# Patient Record
Sex: Female | Born: 1940
Health system: Southern US, Community
[De-identification: ages and names within clinical notes are randomized; demographics above are authoritative.]

## PROBLEM LIST (undated history)

## (undated) DIAGNOSIS — J45909 Unspecified asthma, uncomplicated: Secondary | ICD-10-CM

## (undated) DIAGNOSIS — E119 Type 2 diabetes mellitus without complications: Secondary | ICD-10-CM

## (undated) DIAGNOSIS — I10 Essential (primary) hypertension: Secondary | ICD-10-CM

## (undated) HISTORY — PX: KNEE ARTHROSCOPY: SUR90

## (undated) HISTORY — PX: REPLACEMENT TOTAL KNEE: SUR1224

---

## 2016-06-12 ENCOUNTER — Ambulatory Visit (HOSPITAL_COMMUNITY)
Admission: RE | Admit: 2016-06-12 | Discharge: 2016-06-12 | Disposition: A | Discharge status: Home / Self Care | Payer: Medicare Other | Source: Ambulatory Visit | Attending: Nurse Practitioner | Admitting: Nurse Practitioner

## 2016-06-12 ENCOUNTER — Encounter (HOSPITAL_BASED_OUTPATIENT_CLINIC_OR_DEPARTMENT_OTHER): Payer: Medicare Other | Attending: Internal Medicine

## 2016-06-12 ENCOUNTER — Other Ambulatory Visit: Payer: Self-pay | Admitting: Nurse Practitioner

## 2016-06-12 DIAGNOSIS — I10 Essential (primary) hypertension: Secondary | ICD-10-CM | POA: Insufficient documentation

## 2016-06-12 DIAGNOSIS — L97526 Non-pressure chronic ulcer of other part of left foot with bone involvement without evidence of necrosis: Secondary | ICD-10-CM | POA: Insufficient documentation

## 2016-06-12 DIAGNOSIS — L89893 Pressure ulcer of other site, stage 3: Secondary | ICD-10-CM | POA: Insufficient documentation

## 2016-06-12 DIAGNOSIS — E1140 Type 2 diabetes mellitus with diabetic neuropathy, unspecified: Secondary | ICD-10-CM | POA: Insufficient documentation

## 2016-06-12 DIAGNOSIS — E11621 Type 2 diabetes mellitus with foot ulcer: Secondary | ICD-10-CM | POA: Insufficient documentation

## 2016-06-12 DIAGNOSIS — M869 Osteomyelitis, unspecified: Secondary | ICD-10-CM

## 2016-06-12 DIAGNOSIS — Z794 Long term (current) use of insulin: Secondary | ICD-10-CM | POA: Diagnosis not present

## 2016-06-12 DIAGNOSIS — Z96651 Presence of right artificial knee joint: Secondary | ICD-10-CM | POA: Diagnosis not present

## 2016-06-19 DIAGNOSIS — E11621 Type 2 diabetes mellitus with foot ulcer: Secondary | ICD-10-CM | POA: Diagnosis not present

## 2016-06-20 ENCOUNTER — Other Ambulatory Visit: Payer: Self-pay | Admitting: Internal Medicine

## 2016-06-20 ENCOUNTER — Other Ambulatory Visit (HOSPITAL_COMMUNITY): Payer: Self-pay | Admitting: *Deleted

## 2016-06-20 DIAGNOSIS — E1169 Type 2 diabetes mellitus with other specified complication: Principal | ICD-10-CM

## 2016-06-20 DIAGNOSIS — M869 Osteomyelitis, unspecified: Principal | ICD-10-CM

## 2016-06-20 DIAGNOSIS — E11621 Type 2 diabetes mellitus with foot ulcer: Secondary | ICD-10-CM

## 2016-06-20 DIAGNOSIS — L97509 Non-pressure chronic ulcer of other part of unspecified foot with unspecified severity: Principal | ICD-10-CM

## 2016-06-25 ENCOUNTER — Other Ambulatory Visit: Payer: Self-pay | Admitting: Internal Medicine

## 2016-06-26 DIAGNOSIS — E11621 Type 2 diabetes mellitus with foot ulcer: Secondary | ICD-10-CM | POA: Diagnosis not present

## 2016-06-27 ENCOUNTER — Ambulatory Visit (HOSPITAL_COMMUNITY): Payer: Medicare Other

## 2016-07-02 ENCOUNTER — Ambulatory Visit (HOSPITAL_COMMUNITY)
Admission: RE | Admit: 2016-07-02 | Discharge: 2016-07-02 | Disposition: A | Discharge status: Home / Self Care | Payer: Medicare Other | Source: Ambulatory Visit | Attending: Internal Medicine | Admitting: Internal Medicine

## 2016-07-02 ENCOUNTER — Encounter (HOSPITAL_COMMUNITY): Payer: Self-pay | Admitting: Radiology

## 2016-07-02 DIAGNOSIS — E11621 Type 2 diabetes mellitus with foot ulcer: Secondary | ICD-10-CM | POA: Diagnosis present

## 2016-07-02 DIAGNOSIS — M2012 Hallux valgus (acquired), left foot: Secondary | ICD-10-CM | POA: Diagnosis not present

## 2016-07-02 DIAGNOSIS — L97509 Non-pressure chronic ulcer of other part of unspecified foot with unspecified severity: Secondary | ICD-10-CM | POA: Diagnosis present

## 2016-07-02 DIAGNOSIS — E1169 Type 2 diabetes mellitus with other specified complication: Secondary | ICD-10-CM

## 2016-07-02 DIAGNOSIS — M65872 Other synovitis and tenosynovitis, left ankle and foot: Secondary | ICD-10-CM | POA: Diagnosis not present

## 2016-07-02 DIAGNOSIS — M869 Osteomyelitis, unspecified: Secondary | ICD-10-CM

## 2016-07-03 ENCOUNTER — Other Ambulatory Visit (HOSPITAL_COMMUNITY)
Admission: RE | Admit: 2016-07-03 | Discharge: 2016-07-03 | Disposition: A | Discharge status: Home / Self Care | Payer: Medicare Other | Source: Other Acute Inpatient Hospital | Attending: Internal Medicine | Admitting: Internal Medicine

## 2016-07-03 DIAGNOSIS — L97526 Non-pressure chronic ulcer of other part of left foot with bone involvement without evidence of necrosis: Secondary | ICD-10-CM | POA: Insufficient documentation

## 2016-07-03 DIAGNOSIS — E1140 Type 2 diabetes mellitus with diabetic neuropathy, unspecified: Secondary | ICD-10-CM | POA: Diagnosis present

## 2016-07-03 DIAGNOSIS — E11621 Type 2 diabetes mellitus with foot ulcer: Secondary | ICD-10-CM | POA: Insufficient documentation

## 2016-07-03 LAB — CBC WITH DIFFERENTIAL/PLATELET
BASOS ABS: 0.1 10*3/uL (ref 0.0–0.1)
BASOS PCT: 1 %
Eosinophils Absolute: 0.5 10*3/uL (ref 0.0–0.7)
Eosinophils Relative: 6 %
HEMATOCRIT: 36.4 % (ref 36.0–46.0)
Hemoglobin: 11.8 g/dL — ABNORMAL LOW (ref 12.0–15.0)
LYMPHS PCT: 21 %
Lymphs Abs: 1.9 10*3/uL (ref 0.7–4.0)
MCH: 29.6 pg (ref 26.0–34.0)
MCHC: 32.4 g/dL (ref 30.0–36.0)
MCV: 91.5 fL (ref 78.0–100.0)
Monocytes Absolute: 0.5 10*3/uL (ref 0.1–1.0)
Monocytes Relative: 5 %
NEUTROS ABS: 6 10*3/uL (ref 1.7–7.7)
Neutrophils Relative %: 67 %
PLATELETS: 270 10*3/uL (ref 150–400)
RBC: 3.98 MIL/uL (ref 3.87–5.11)
RDW: 14 % (ref 11.5–15.5)
WBC: 8.9 10*3/uL (ref 4.0–10.5)

## 2016-07-03 LAB — BASIC METABOLIC PANEL
ANION GAP: 9 (ref 5–15)
BUN: 33 mg/dL — ABNORMAL HIGH (ref 6–20)
CO2: 28 mmol/L (ref 22–32)
Calcium: 9.9 mg/dL (ref 8.9–10.3)
Chloride: 105 mmol/L (ref 101–111)
Creatinine, Ser: 2.24 mg/dL — ABNORMAL HIGH (ref 0.44–1.00)
GFR, EST AFRICAN AMERICAN: 23 mL/min — AB (ref >60)
GFR, EST NON AFRICAN AMERICAN: 20 mL/min — AB (ref >60)
GLUCOSE: 181 mg/dL — AB (ref 65–99)
POTASSIUM: 4.5 mmol/L (ref 3.5–5.1)
Sodium: 142 mmol/L (ref 135–145)

## 2016-07-03 LAB — URIC ACID: Uric Acid, Serum: 4.2 mg/dL (ref 2.3–6.6)

## 2016-07-03 LAB — C-REACTIVE PROTEIN: CRP: 4 mg/dL — AB (ref <1.0)

## 2016-07-03 LAB — SEDIMENTATION RATE: SED RATE: 55 mm/h — AB (ref 0–22)

## 2016-07-10 ENCOUNTER — Encounter (HOSPITAL_BASED_OUTPATIENT_CLINIC_OR_DEPARTMENT_OTHER): Payer: Medicare Other | Attending: Internal Medicine

## 2016-07-10 DIAGNOSIS — E1140 Type 2 diabetes mellitus with diabetic neuropathy, unspecified: Secondary | ICD-10-CM | POA: Insufficient documentation

## 2016-07-10 DIAGNOSIS — E11621 Type 2 diabetes mellitus with foot ulcer: Secondary | ICD-10-CM | POA: Diagnosis not present

## 2016-07-10 DIAGNOSIS — I10 Essential (primary) hypertension: Secondary | ICD-10-CM | POA: Insufficient documentation

## 2016-07-10 DIAGNOSIS — L97524 Non-pressure chronic ulcer of other part of left foot with necrosis of bone: Secondary | ICD-10-CM | POA: Diagnosis not present

## 2016-07-14 ENCOUNTER — Encounter: Payer: Self-pay | Admitting: Family Medicine

## 2016-07-14 DIAGNOSIS — I10 Essential (primary) hypertension: Secondary | ICD-10-CM | POA: Insufficient documentation

## 2016-07-14 DIAGNOSIS — Z794 Long term (current) use of insulin: Secondary | ICD-10-CM

## 2016-07-14 DIAGNOSIS — M159 Polyosteoarthritis, unspecified: Secondary | ICD-10-CM | POA: Insufficient documentation

## 2016-07-14 DIAGNOSIS — E119 Type 2 diabetes mellitus without complications: Secondary | ICD-10-CM | POA: Insufficient documentation

## 2016-07-14 DIAGNOSIS — M109 Gout, unspecified: Secondary | ICD-10-CM | POA: Insufficient documentation

## 2016-07-17 ENCOUNTER — Other Ambulatory Visit: Payer: Self-pay | Admitting: Internal Medicine

## 2016-07-17 DIAGNOSIS — E11621 Type 2 diabetes mellitus with foot ulcer: Secondary | ICD-10-CM | POA: Diagnosis not present

## 2016-07-18 ENCOUNTER — Ambulatory Visit: Payer: Medicare Other | Admitting: Family Medicine

## 2016-07-24 DIAGNOSIS — E11621 Type 2 diabetes mellitus with foot ulcer: Secondary | ICD-10-CM | POA: Diagnosis not present

## 2016-07-29 ENCOUNTER — Encounter: Payer: Self-pay | Admitting: Internal Medicine

## 2016-07-29 ENCOUNTER — Ambulatory Visit (INDEPENDENT_AMBULATORY_CARE_PROVIDER_SITE_OTHER): Payer: Medicare Other | Admitting: Internal Medicine

## 2016-07-29 VITALS — BP 136/78 | HR 89 | Temp 98.4°F | Wt 154.0 lb

## 2016-07-29 DIAGNOSIS — M10379 Gout due to renal impairment, unspecified ankle and foot: Secondary | ICD-10-CM

## 2016-07-29 DIAGNOSIS — M8660 Other chronic osteomyelitis, unspecified site: Secondary | ICD-10-CM | POA: Diagnosis not present

## 2016-07-30 ENCOUNTER — Ambulatory Visit (HOSPITAL_COMMUNITY)
Admission: RE | Admit: 2016-07-30 | Discharge: 2016-07-30 | Disposition: A | Discharge status: Home / Self Care | Payer: Medicare Other | Source: Ambulatory Visit | Attending: Internal Medicine | Admitting: Internal Medicine

## 2016-07-30 ENCOUNTER — Other Ambulatory Visit (HOSPITAL_COMMUNITY): Payer: Self-pay | Admitting: *Deleted

## 2016-07-30 ENCOUNTER — Encounter (HOSPITAL_COMMUNITY)
Admission: RE | Admit: 2016-07-30 | Discharge: 2016-07-30 | Disposition: A | Discharge status: Home / Self Care | Payer: Medicare Other | Source: Ambulatory Visit | Attending: Internal Medicine | Admitting: Internal Medicine

## 2016-07-30 ENCOUNTER — Other Ambulatory Visit: Payer: Self-pay | Admitting: Internal Medicine

## 2016-07-30 ENCOUNTER — Encounter (HOSPITAL_COMMUNITY): Payer: Self-pay | Admitting: Diagnostic Radiology

## 2016-07-30 DIAGNOSIS — M8660 Other chronic osteomyelitis, unspecified site: Secondary | ICD-10-CM

## 2016-07-30 DIAGNOSIS — L97529 Non-pressure chronic ulcer of other part of left foot with unspecified severity: Secondary | ICD-10-CM | POA: Diagnosis not present

## 2016-07-30 HISTORY — PX: IR GENERIC HISTORICAL: IMG1180011

## 2016-07-30 MED ORDER — HEPARIN SOD (PORK) LOCK FLUSH 100 UNIT/ML IV SOLN
INTRAVENOUS | Status: AC
  Administered 2016-07-30: 250 [IU]
  Filled 2016-07-30: qty 5

## 2016-07-30 MED ORDER — SODIUM CHLORIDE 0.9 % IV SOLN
400.0000 mg | Freq: Once | INTRAVENOUS | Status: AC
  Administered 2016-07-30: 400 mg via INTRAVENOUS
  Filled 2016-07-30: qty 400

## 2016-07-30 MED ORDER — HEPARIN SOD (PORK) LOCK FLUSH 100 UNIT/ML IV SOLN
250.0000 [IU] | INTRAVENOUS | Status: AC | PRN
  Administered 2016-07-30: 250 [IU]

## 2016-07-30 MED ORDER — LIDOCAINE HCL (PF) 1 % IJ SOLN
INTRAMUSCULAR | Status: AC
  Filled 2016-07-30: qty 30

## 2016-07-30 NOTE — Procedures (Signed)
Placement of right arm (basilic vein) PICC.  Tip at SVC/RA junction.  Minimal blood loss.  No immediate complication.

## 2016-07-30 NOTE — Progress Notes (Signed)
[facility] for Infectious Disease      Reason for Consult: possible osteomyelitis     Referring Physician: Dr. Dellia Nims    Patient ID: Angela Castro, female    DOB: 04/03/1941, 76 y.o.   MRN: [id number]  HPI:   She comes in for evaluation of long-standing left medial first metatarsal head ulcer.  She reports this started in July and she was going to wound care near her home then in [facility] and it started to heal.  She was getting better so stopped going to the wound care there and treated it herself.  It then started to worsen again and in January she moved her to [facility] and started seeing Dr Dellia Nims.  At the time she had been on Keflex which she got from an urgent care center and started silver collagen foam.  She had an xray and then MRI that did not show osteomyelitis but suggested gout and a bone biopsy was done and did not have any bacterial growth or report of osteomyelitis.  She though has been on 3 different oral antibiotics including Augmentin, Flagyl and doxycycline and did not some improvement of the soft tissue swelling.  She and her husband report they are hopeful to avoid amputation.  Her recent ABI by report were normal.  She has little feeling in her feet.  CRP 4, ESR 55 MRi independently reviewed and I see some increased soft tissue areas and some erosion.  Previous record reviewed from Dr Dellia Nims and that she was treated for a gout flare with colchicine but the patient did not recall taking this.    PMH: diabetes on insulin, gout, peripheral neuropathy  Prior to Admission medications   Medication Sig Start Date End Date Taking? Authorizing Provider  allopurinol (ZYLOPRIM) 100 MG tablet Take 100 mg by mouth daily.   Yes Historical Provider, MD  amLODipine (NORVASC) 5 MG tablet Take 5 mg by mouth daily.   Yes Historical Provider, MD  aspirin EC 81 MG tablet Take 81 mg by mouth daily.   Yes Historical Provider, MD  bisoprolol (ZEBETA) 10 MG tablet Take 10 mg by mouth  daily.   Yes Historical Provider, MD  calcium carbonate (OS-CAL - DOSED IN MG OF ELEMENTAL CALCIUM) 1250 (500 Ca) MG tablet Take 1 tablet by mouth.   Yes Historical Provider, MD  clobetasol (TEMOVATE) 0.05 % GEL Apply topically 2 (two) times daily.   Yes Historical Provider, MD  fluocinonide cream (LIDEX) 3.71 % Apply 1 application topically 2 (two) times daily.   Yes Historical Provider, MD  gabapentin (NEURONTIN) 300 MG capsule Take 300 mg by mouth 3 (three) times daily.   Yes Historical Provider, MD  Insulin Glargine (LANTUS SOLOSTAR) 100 UNIT/ML Solostar Pen Inject into the skin daily at 10 pm.   Yes Historical Provider, MD  linagliptin (TRADJENTA) 5 MG TABS tablet Take 5 mg by mouth daily.   Yes Historical Provider, MD  loratadine (CLARITIN) 10 MG tablet Take 10 mg by mouth daily.   Yes Historical Provider, MD  losartan (COZAAR) 100 MG tablet Take 100 mg by mouth daily.   Yes Historical Provider, MD  simvastatin (ZOCOR) 5 MG tablet Take 5 mg by mouth daily.   Yes Historical Provider, MD  traMADol (ULTRAM) 50 MG tablet Take 50 mg by mouth every 6 (six) hours as needed.   Yes Historical Provider, MD  vitamin C (ASCORBIC ACID) 500 MG tablet Take 500 mg by mouth daily.   Yes Historical Provider,  MD    Allergies  Allergen Reactions  . Sulfa Antibiotics     Social History  Substance Use Topics  . Smoking status: Never Smoker  . Smokeless tobacco: Never Used  . Alcohol use No    FMH: cardiac disease  Review of Systems  Constitutional: negative for fevers, chills and malaise Gastrointestinal: negative for some GI upset with Flagyl Integument/breast: negative for rash All other systems reviewed and are negative    Constitutional: in no apparent distress  Vitals:   07/29/16 1101  BP: 136/78  Pulse: 89  Temp: 98.4 F (36.9 C)   EYES: anicteric ENMT: no thrush Cardiovascular: Cor RRR Respiratory: CTA B; normal respiratory effort GI: Bowel sounds are normal, soft,  nt Musculoskeletal: no pedal edema noted Skin: negatives: no rash  Labs: Lab Results  Component Value Date   WBC 8.9 07/03/2016   HGB 11.8 (L) 07/03/2016   HCT 36.4 07/03/2016   MCV 91.5 07/03/2016   PLT 270 07/03/2016    Lab Results  Component Value Date   CREATININE 2.24 (H) 07/03/2016   BUN 33 (H) 07/03/2016   NA 142 07/03/2016   K 4.5 07/03/2016   CL 105 07/03/2016   CO2 28 07/03/2016   No results found for: ALT, AST, GGT, ALKPHOS, BILITOT, INR   Assessment: possible osteomyelitis, chronic.  Diabetes, well-controlled.  I agree that it is possible this is osteomyelitis and really the only way to know is to start treatment and see if the ulcer heals.  She has good blood flow so should not have any issues.  If no improvement, may need to be more aggressive with gout treatment.    Plan: 1) picc line by IR 2) start Teflaro for 6 weeks 3) home health 4) labs today 5) rtc 3 weeks 6) continue wound care per Dr. Dellia Nims - they will discuss with him if the procedure scheduled this week is appropriate now or to wait

## 2016-07-31 DIAGNOSIS — E11621 Type 2 diabetes mellitus with foot ulcer: Secondary | ICD-10-CM | POA: Diagnosis not present

## 2016-08-04 ENCOUNTER — Encounter: Payer: Self-pay | Admitting: Internal Medicine

## 2016-08-04 DIAGNOSIS — E11621 Type 2 diabetes mellitus with foot ulcer: Secondary | ICD-10-CM | POA: Diagnosis not present

## 2016-08-11 ENCOUNTER — Other Ambulatory Visit (HOSPITAL_COMMUNITY)
Admission: AD | Admit: 2016-08-11 | Discharge: 2016-08-11 | Disposition: A | Discharge status: Home / Self Care | Payer: Medicare Other | Source: Other Acute Inpatient Hospital | Attending: Internal Medicine | Admitting: Internal Medicine

## 2016-08-11 DIAGNOSIS — M86172 Other acute osteomyelitis, left ankle and foot: Secondary | ICD-10-CM | POA: Diagnosis present

## 2016-08-11 LAB — COMPREHENSIVE METABOLIC PANEL
ALT: 15 U/L (ref 14–54)
AST: 25 U/L (ref 15–41)
Albumin: 2.9 g/dL — ABNORMAL LOW (ref 3.5–5.0)
Alkaline Phosphatase: 90 U/L (ref 38–126)
Anion gap: 13 (ref 5–15)
BUN: 26 mg/dL — ABNORMAL HIGH (ref 6–20)
CHLORIDE: 103 mmol/L (ref 101–111)
CO2: 26 mmol/L (ref 22–32)
CREATININE: 2.13 mg/dL — AB (ref 0.44–1.00)
Calcium: 9 mg/dL (ref 8.9–10.3)
GFR calc non Af Amer: 22 mL/min — ABNORMAL LOW (ref >60)
GFR, EST AFRICAN AMERICAN: 25 mL/min — AB (ref >60)
Glucose, Bld: 133 mg/dL — ABNORMAL HIGH (ref 65–99)
Potassium: 4.6 mmol/L (ref 3.5–5.1)
SODIUM: 142 mmol/L (ref 135–145)
Total Bilirubin: 0.3 mg/dL (ref 0.3–1.2)
Total Protein: 5.9 g/dL — ABNORMAL LOW (ref 6.5–8.1)

## 2016-08-11 LAB — CBC WITH DIFFERENTIAL/PLATELET
BASOS ABS: 0.1 10*3/uL (ref 0.0–0.1)
Basophils Relative: 1 %
EOS ABS: 0.6 10*3/uL (ref 0.0–0.7)
Eosinophils Relative: 7 %
HCT: 34 % — ABNORMAL LOW (ref 36.0–46.0)
Hemoglobin: 10.4 g/dL — ABNORMAL LOW (ref 12.0–15.0)
LYMPHS ABS: 1.5 10*3/uL (ref 0.7–4.0)
LYMPHS PCT: 18 %
MCH: 29.4 pg (ref 26.0–34.0)
MCHC: 30.6 g/dL (ref 30.0–36.0)
MCV: 96 fL (ref 78.0–100.0)
MONOS PCT: 6 %
Monocytes Absolute: 0.5 10*3/uL (ref 0.1–1.0)
NEUTROS ABS: 6 10*3/uL (ref 1.7–7.7)
NEUTROS PCT: 68 %
PLATELETS: 305 10*3/uL (ref 150–400)
RBC: 3.54 MIL/uL — AB (ref 3.87–5.11)
RDW: 14.3 % (ref 11.5–15.5)
WBC: 8.6 10*3/uL (ref 4.0–10.5)

## 2016-08-15 ENCOUNTER — Encounter (HOSPITAL_BASED_OUTPATIENT_CLINIC_OR_DEPARTMENT_OTHER): Payer: Medicare Other | Attending: Internal Medicine

## 2016-08-15 DIAGNOSIS — J449 Chronic obstructive pulmonary disease, unspecified: Secondary | ICD-10-CM | POA: Insufficient documentation

## 2016-08-15 DIAGNOSIS — I87332 Chronic venous hypertension (idiopathic) with ulcer and inflammation of left lower extremity: Secondary | ICD-10-CM | POA: Insufficient documentation

## 2016-08-15 DIAGNOSIS — Z7984 Long term (current) use of oral hypoglycemic drugs: Secondary | ICD-10-CM | POA: Insufficient documentation

## 2016-08-15 DIAGNOSIS — G4730 Sleep apnea, unspecified: Secondary | ICD-10-CM | POA: Diagnosis not present

## 2016-08-15 DIAGNOSIS — I10 Essential (primary) hypertension: Secondary | ICD-10-CM | POA: Diagnosis not present

## 2016-08-15 DIAGNOSIS — L97822 Non-pressure chronic ulcer of other part of left lower leg with fat layer exposed: Secondary | ICD-10-CM | POA: Diagnosis not present

## 2016-08-15 DIAGNOSIS — E11622 Type 2 diabetes mellitus with other skin ulcer: Secondary | ICD-10-CM | POA: Diagnosis not present

## 2016-08-15 DIAGNOSIS — I890 Lymphedema, not elsewhere classified: Secondary | ICD-10-CM | POA: Diagnosis not present

## 2016-08-18 ENCOUNTER — Encounter: Payer: Self-pay | Admitting: Internal Medicine

## 2016-08-19 ENCOUNTER — Other Ambulatory Visit: Payer: Self-pay | Admitting: Internal Medicine

## 2016-08-19 ENCOUNTER — Encounter: Payer: Self-pay | Admitting: Internal Medicine

## 2016-08-19 ENCOUNTER — Ambulatory Visit (INDEPENDENT_AMBULATORY_CARE_PROVIDER_SITE_OTHER): Payer: Medicare Other | Admitting: Internal Medicine

## 2016-08-19 ENCOUNTER — Telehealth: Payer: Self-pay | Admitting: *Deleted

## 2016-08-19 DIAGNOSIS — L97522 Non-pressure chronic ulcer of other part of left foot with fat layer exposed: Secondary | ICD-10-CM | POA: Diagnosis not present

## 2016-08-19 DIAGNOSIS — M8660 Other chronic osteomyelitis, unspecified site: Secondary | ICD-10-CM

## 2016-08-19 DIAGNOSIS — Z7189 Other specified counseling: Secondary | ICD-10-CM | POA: Insufficient documentation

## 2016-08-19 DIAGNOSIS — Z5181 Encounter for therapeutic drug level monitoring: Secondary | ICD-10-CM | POA: Diagnosis not present

## 2016-08-19 DIAGNOSIS — L98499 Non-pressure chronic ulcer of skin of other sites with unspecified severity: Secondary | ICD-10-CM

## 2016-08-19 NOTE — Assessment & Plan Note (Signed)
Creat has been stable and Teflaro has been dose adjusted.

## 2016-08-19 NOTE — Telephone Encounter (Signed)
Coretta with [facility] notified. Myrtis Hopping CMA

## 2016-08-19 NOTE — Telephone Encounter (Signed)
-----  Message from Thayer Headings, MD sent at 08/19/2016  1:05 PM EDT ----- Could you have [facility] do an ESR and CRP with the next blood draw? thanks

## 2016-08-19 NOTE — Assessment & Plan Note (Signed)
Seems to be improving, though the ulcer is larger but bone covered.  I recommended continued wound care with Dr. Dellia Nims.

## 2016-08-19 NOTE — Assessment & Plan Note (Addendum)
Tolerating the Teflaro well and hopefully making a difference.  rtc 2 weeks to see if wound good at that time.   Will repeat CRP and ESR.

## 2016-08-19 NOTE — Progress Notes (Signed)
   Subjective:    Patient ID: Angela Castro, female    DOB: 08/04/1940, 76 y.o.   MRN: [id number]  HPI Here for follow up of osteomyelitis.  I started her on Teflaro and now has been on about 3 weeks.  She feels better, no significant drainage, no fever, no chills.  Some improvement in her wound.  Last saw Dr. Janalyn Rouse PA and her husband reports she was told to consider referral for amputation.  No associated n/v/d.  No rash.  No missed doses of antibiotics.     Review of Systems  Constitutional: Negative for fatigue and fever.  Gastrointestinal: Negative for diarrhea.  Skin: Negative for rash.       Objective:   Physical Exam  Constitutional: She appears well-developed and well-nourished.  Cardiovascular: Normal rate, regular rhythm and normal heart sounds.   Musculoskeletal:  Ulcer lesion with fleshy fat, no surrounding erythema, no drainage  Skin: No rash noted.   SH: no tobacco       Assessment & Plan:

## 2016-08-22 DIAGNOSIS — E11622 Type 2 diabetes mellitus with other skin ulcer: Secondary | ICD-10-CM | POA: Diagnosis not present

## 2016-08-24 ENCOUNTER — Emergency Department (HOSPITAL_COMMUNITY): Payer: Medicare Other

## 2016-08-24 ENCOUNTER — Inpatient Hospital Stay (HOSPITAL_COMMUNITY)
Admission: EM | Admit: 2016-08-24 | Discharge: 2016-08-28 | DRG: 617 | Disposition: A | Discharge status: Home Health | Payer: Medicare Other | Attending: Internal Medicine | Admitting: Internal Medicine

## 2016-08-24 ENCOUNTER — Encounter (HOSPITAL_COMMUNITY): Payer: Self-pay | Admitting: Emergency Medicine

## 2016-08-24 DIAGNOSIS — T368X5A Adverse effect of other systemic antibiotics, initial encounter: Secondary | ICD-10-CM | POA: Diagnosis present

## 2016-08-24 DIAGNOSIS — E785 Hyperlipidemia, unspecified: Secondary | ICD-10-CM | POA: Diagnosis present

## 2016-08-24 DIAGNOSIS — Z7982 Long term (current) use of aspirin: Secondary | ICD-10-CM

## 2016-08-24 DIAGNOSIS — L270 Generalized skin eruption due to drugs and medicaments taken internally: Secondary | ICD-10-CM | POA: Diagnosis present

## 2016-08-24 DIAGNOSIS — M109 Gout, unspecified: Secondary | ICD-10-CM | POA: Diagnosis present

## 2016-08-24 DIAGNOSIS — N184 Chronic kidney disease, stage 4 (severe): Secondary | ICD-10-CM | POA: Diagnosis not present

## 2016-08-24 DIAGNOSIS — E1122 Type 2 diabetes mellitus with diabetic chronic kidney disease: Secondary | ICD-10-CM | POA: Diagnosis present

## 2016-08-24 DIAGNOSIS — N179 Acute kidney failure, unspecified: Secondary | ICD-10-CM | POA: Diagnosis present

## 2016-08-24 DIAGNOSIS — N183 Chronic kidney disease, stage 3 (moderate): Secondary | ICD-10-CM | POA: Diagnosis not present

## 2016-08-24 DIAGNOSIS — R509 Fever, unspecified: Secondary | ICD-10-CM | POA: Diagnosis not present

## 2016-08-24 DIAGNOSIS — M869 Osteomyelitis, unspecified: Secondary | ICD-10-CM | POA: Diagnosis present

## 2016-08-24 DIAGNOSIS — K521 Toxic gastroenteritis and colitis: Secondary | ICD-10-CM | POA: Diagnosis not present

## 2016-08-24 DIAGNOSIS — Z794 Long term (current) use of insulin: Secondary | ICD-10-CM | POA: Diagnosis not present

## 2016-08-24 DIAGNOSIS — R531 Weakness: Secondary | ICD-10-CM

## 2016-08-24 DIAGNOSIS — N189 Chronic kidney disease, unspecified: Secondary | ICD-10-CM

## 2016-08-24 DIAGNOSIS — E11621 Type 2 diabetes mellitus with foot ulcer: Secondary | ICD-10-CM | POA: Diagnosis present

## 2016-08-24 DIAGNOSIS — T50995A Adverse effect of other drugs, medicaments and biological substances, initial encounter: Secondary | ICD-10-CM | POA: Diagnosis not present

## 2016-08-24 DIAGNOSIS — E669 Obesity, unspecified: Secondary | ICD-10-CM | POA: Diagnosis not present

## 2016-08-24 DIAGNOSIS — M86172 Other acute osteomyelitis, left ankle and foot: Secondary | ICD-10-CM | POA: Diagnosis not present

## 2016-08-24 DIAGNOSIS — D509 Iron deficiency anemia, unspecified: Secondary | ICD-10-CM | POA: Diagnosis present

## 2016-08-24 DIAGNOSIS — M8600 Acute hematogenous osteomyelitis, unspecified site: Secondary | ICD-10-CM | POA: Diagnosis not present

## 2016-08-24 DIAGNOSIS — E1169 Type 2 diabetes mellitus with other specified complication: Secondary | ICD-10-CM | POA: Diagnosis not present

## 2016-08-24 DIAGNOSIS — M8660 Other chronic osteomyelitis, unspecified site: Secondary | ICD-10-CM

## 2016-08-24 DIAGNOSIS — M868X8 Other osteomyelitis, other site: Secondary | ICD-10-CM | POA: Diagnosis not present

## 2016-08-24 DIAGNOSIS — J45909 Unspecified asthma, uncomplicated: Secondary | ICD-10-CM | POA: Diagnosis present

## 2016-08-24 DIAGNOSIS — Z6832 Body mass index (BMI) 32.0-32.9, adult: Secondary | ICD-10-CM | POA: Diagnosis not present

## 2016-08-24 DIAGNOSIS — E119 Type 2 diabetes mellitus without complications: Secondary | ICD-10-CM | POA: Diagnosis present

## 2016-08-24 DIAGNOSIS — L97429 Non-pressure chronic ulcer of left heel and midfoot with unspecified severity: Secondary | ICD-10-CM | POA: Diagnosis present

## 2016-08-24 DIAGNOSIS — Z79899 Other long term (current) drug therapy: Secondary | ICD-10-CM

## 2016-08-24 DIAGNOSIS — E1142 Type 2 diabetes mellitus with diabetic polyneuropathy: Secondary | ICD-10-CM | POA: Diagnosis present

## 2016-08-24 DIAGNOSIS — I129 Hypertensive chronic kidney disease with stage 1 through stage 4 chronic kidney disease, or unspecified chronic kidney disease: Secondary | ICD-10-CM | POA: Diagnosis present

## 2016-08-24 DIAGNOSIS — I82409 Acute embolism and thrombosis of unspecified deep veins of unspecified lower extremity: Secondary | ICD-10-CM

## 2016-08-24 DIAGNOSIS — N181 Chronic kidney disease, stage 1: Secondary | ICD-10-CM | POA: Diagnosis not present

## 2016-08-24 DIAGNOSIS — N170 Acute kidney failure with tubular necrosis: Secondary | ICD-10-CM | POA: Diagnosis not present

## 2016-08-24 HISTORY — DX: Type 2 diabetes mellitus without complications: E11.9

## 2016-08-24 HISTORY — DX: Unspecified asthma, uncomplicated: J45.909

## 2016-08-24 HISTORY — DX: Essential (primary) hypertension: I10

## 2016-08-24 LAB — URINALYSIS, ROUTINE W REFLEX MICROSCOPIC
Bilirubin Urine: NEGATIVE
Glucose, UA: NEGATIVE mg/dL
HGB URINE DIPSTICK: NEGATIVE
KETONES UR: NEGATIVE mg/dL
Leukocytes, UA: NEGATIVE
Nitrite: NEGATIVE
PROTEIN: NEGATIVE mg/dL
Specific Gravity, Urine: 1.015 (ref 1.005–1.030)
pH: 5 (ref 5.0–8.0)

## 2016-08-24 LAB — COMPREHENSIVE METABOLIC PANEL
ALT: 23 U/L (ref 14–54)
AST: 30 U/L (ref 15–41)
Albumin: 2.5 g/dL — ABNORMAL LOW (ref 3.5–5.0)
Alkaline Phosphatase: 65 U/L (ref 38–126)
Anion gap: 9 (ref 5–15)
BILIRUBIN TOTAL: 0.6 mg/dL (ref 0.3–1.2)
BUN: 48 mg/dL — ABNORMAL HIGH (ref 6–20)
CO2: 25 mmol/L (ref 22–32)
CREATININE: 3.75 mg/dL — AB (ref 0.44–1.00)
Calcium: 8.6 mg/dL — ABNORMAL LOW (ref 8.9–10.3)
Chloride: 108 mmol/L (ref 101–111)
GFR calc Af Amer: 13 mL/min — ABNORMAL LOW (ref >60)
GFR, EST NON AFRICAN AMERICAN: 11 mL/min — AB (ref >60)
Glucose, Bld: 110 mg/dL — ABNORMAL HIGH (ref 65–99)
Potassium: 4.6 mmol/L (ref 3.5–5.1)
Sodium: 142 mmol/L (ref 135–145)
Total Protein: 5.7 g/dL — ABNORMAL LOW (ref 6.5–8.1)

## 2016-08-24 LAB — CBC
HEMATOCRIT: 30.5 % — AB (ref 36.0–46.0)
Hemoglobin: 9.3 g/dL — ABNORMAL LOW (ref 12.0–15.0)
MCH: 27.1 pg (ref 26.0–34.0)
MCHC: 30.5 g/dL (ref 30.0–36.0)
MCV: 88.9 fL (ref 78.0–100.0)
Platelets: 175 10*3/uL (ref 150–400)
RBC: 3.43 MIL/uL — ABNORMAL LOW (ref 3.87–5.11)
RDW: 14.1 % (ref 11.5–15.5)
WBC: 4.3 10*3/uL (ref 4.0–10.5)

## 2016-08-24 LAB — CREATININE, SERUM
CREATININE: 3.48 mg/dL — AB (ref 0.44–1.00)
GFR, EST AFRICAN AMERICAN: 14 mL/min — AB (ref >60)
GFR, EST NON AFRICAN AMERICAN: 12 mL/min — AB (ref >60)

## 2016-08-24 LAB — CBC WITH DIFFERENTIAL/PLATELET
BASOS ABS: 0 10*3/uL (ref 0.0–0.1)
Basophils Relative: 1 %
EOS ABS: 1.2 10*3/uL — AB (ref 0.0–0.7)
Eosinophils Relative: 25 %
HCT: 31 % — ABNORMAL LOW (ref 36.0–46.0)
Hemoglobin: 9.6 g/dL — ABNORMAL LOW (ref 12.0–15.0)
LYMPHS ABS: 1 10*3/uL (ref 0.7–4.0)
Lymphocytes Relative: 21 %
MCH: 27.7 pg (ref 26.0–34.0)
MCHC: 31 g/dL (ref 30.0–36.0)
MCV: 89.6 fL (ref 78.0–100.0)
MONO ABS: 0.3 10*3/uL (ref 0.1–1.0)
Monocytes Relative: 7 %
NEUTROS ABS: 2.2 10*3/uL (ref 1.7–7.7)
Neutrophils Relative %: 46 %
Platelets: 180 10*3/uL (ref 150–400)
RBC: 3.46 MIL/uL — ABNORMAL LOW (ref 3.87–5.11)
RDW: 14.4 % (ref 11.5–15.5)
WBC: 4.7 10*3/uL (ref 4.0–10.5)

## 2016-08-24 LAB — GLUCOSE, CAPILLARY
Glucose-Capillary: 119 mg/dL — ABNORMAL HIGH (ref 65–99)
Glucose-Capillary: 132 mg/dL — ABNORMAL HIGH (ref 65–99)

## 2016-08-24 LAB — MAGNESIUM: Magnesium: 1.8 mg/dL (ref 1.7–2.4)

## 2016-08-24 LAB — PHOSPHORUS: Phosphorus: 4.4 mg/dL (ref 2.5–4.6)

## 2016-08-24 LAB — CK: CK TOTAL: 97 U/L (ref 38–234)

## 2016-08-24 LAB — I-STAT CG4 LACTIC ACID, ED
LACTIC ACID, VENOUS: 1.44 mmol/L (ref 0.5–1.9)
Lactic Acid, Venous: 0.91 mmol/L (ref 0.5–1.9)

## 2016-08-24 LAB — CREATININE, URINE, RANDOM: Creatinine, Urine: 168.68 mg/dL

## 2016-08-24 LAB — PROTIME-INR
INR: 1.26
Prothrombin Time: 15.8 seconds — ABNORMAL HIGH (ref 11.4–15.2)

## 2016-08-24 LAB — SODIUM, URINE, RANDOM: Sodium, Ur: 57 mmol/L

## 2016-08-24 MED ORDER — HYDRALAZINE HCL 20 MG/ML IJ SOLN: 10.0000 mg | Freq: Three times a day (TID) | INTRAMUSCULAR | Status: DC | PRN

## 2016-08-24 MED ORDER — SODIUM CHLORIDE 0.9 % IV BOLUS (SEPSIS)
1000.0000 mL | Freq: Once | INTRAVENOUS | Status: AC
  Administered 2016-08-24: 1000 mL via INTRAVENOUS

## 2016-08-24 MED ORDER — SODIUM CHLORIDE 0.9 % IV SOLN
INTRAVENOUS | Status: DC
  Administered 2016-08-24 – 2016-08-25 (×2): via INTRAVENOUS
  Administered 2016-08-25 (×2): 1000 mL via INTRAVENOUS

## 2016-08-24 MED ORDER — SODIUM CHLORIDE 0.9 % IV SOLN
INTRAVENOUS | Status: DC
Start: 2016-08-24 — End: 2016-08-24

## 2016-08-24 MED ORDER — ACETAMINOPHEN 325 MG PO TABS: 650.0000 mg | ORAL_TABLET | Freq: Four times a day (QID) | ORAL | Status: DC | PRN

## 2016-08-24 MED ORDER — ONDANSETRON HCL 4 MG/2ML IJ SOLN: 4.0000 mg | Freq: Four times a day (QID) | INTRAMUSCULAR | Status: DC | PRN

## 2016-08-24 MED ORDER — ASPIRIN EC 81 MG PO TBEC
81.0000 mg | DELAYED_RELEASE_TABLET | Freq: Every day | ORAL | Status: DC
  Administered 2016-08-24 – 2016-08-28 (×4): 81 mg via ORAL
  Filled 2016-08-24 (×4): qty 1

## 2016-08-24 MED ORDER — SODIUM CHLORIDE 0.9 % IV BOLUS (SEPSIS): 1000.0000 mL | Freq: Once | INTRAVENOUS | Status: DC

## 2016-08-24 MED ORDER — ALLOPURINOL 100 MG PO TABS
100.0000 mg | ORAL_TABLET | Freq: Every day | ORAL | Status: DC
  Administered 2016-08-25: 100 mg via ORAL
  Filled 2016-08-24: qty 1

## 2016-08-24 MED ORDER — SODIUM CHLORIDE 0.9 % IV BOLUS (SEPSIS)
500.0000 mL | Freq: Once | INTRAVENOUS | Status: AC
  Administered 2016-08-24: 500 mL via INTRAVENOUS

## 2016-08-24 MED ORDER — VANCOMYCIN HCL 10 G IV SOLR
1500.0000 mg | Freq: Once | INTRAVENOUS | Status: AC
  Administered 2016-08-24: 1500 mg via INTRAVENOUS
  Filled 2016-08-24: qty 1500

## 2016-08-24 MED ORDER — ENOXAPARIN SODIUM 30 MG/0.3ML ~~LOC~~ SOLN
30.0000 mg | SUBCUTANEOUS | Status: DC
  Administered 2016-08-24: 30 mg via SUBCUTANEOUS
  Filled 2016-08-24: qty 0.3

## 2016-08-24 MED ORDER — ONDANSETRON HCL 4 MG PO TABS: 4.0000 mg | ORAL_TABLET | Freq: Four times a day (QID) | ORAL | Status: DC | PRN

## 2016-08-24 MED ORDER — AMLODIPINE BESYLATE 5 MG PO TABS
5.0000 mg | ORAL_TABLET | Freq: Every day | ORAL | Status: DC
  Administered 2016-08-25: 5 mg via ORAL
  Filled 2016-08-24: qty 1

## 2016-08-24 MED ORDER — SODIUM CHLORIDE 0.9 % IV SOLN
600.0000 mg | INTRAVENOUS | Status: DC
  Administered 2016-08-24: 600 mg via INTRAVENOUS
  Filled 2016-08-24: qty 12

## 2016-08-24 MED ORDER — VITAMIN C 500 MG PO TABS
500.0000 mg | ORAL_TABLET | Freq: Every day | ORAL | Status: DC
Start: 2016-08-25 — End: 2016-08-28
  Administered 2016-08-25 – 2016-08-28 (×3): 500 mg via ORAL
  Filled 2016-08-24 (×3): qty 1

## 2016-08-24 MED ORDER — DAPTOMYCIN 500 MG IV SOLR
600.0000 mg | INTRAVENOUS | Status: DC
  Filled 2016-08-24: qty 12

## 2016-08-24 MED ORDER — ACETAMINOPHEN 650 MG RE SUPP: 650.0000 mg | Freq: Four times a day (QID) | RECTAL | Status: DC | PRN

## 2016-08-24 MED ORDER — SODIUM CHLORIDE 0.9% FLUSH
3.0000 mL | Freq: Two times a day (BID) | INTRAVENOUS | Status: DC
  Administered 2016-08-27 – 2016-08-28 (×2): 3 mL via INTRAVENOUS

## 2016-08-24 NOTE — ED Notes (Signed)
IV fluids continued at 40cc/hr

## 2016-08-24 NOTE — ED Notes (Signed)
Pt given peanut butter crackers and diet ginger ale

## 2016-08-24 NOTE — ED Notes (Signed)
Patient transported to X-ray 

## 2016-08-24 NOTE — ED Notes (Signed)
Report attempted 

## 2016-08-24 NOTE — Progress Notes (Signed)
Angela Castro [id number] Admission Data: 08/24/2016 7:38 PM Attending Provider: Elwin Mocha, MD  PCP:Pcp Not In System Consults/ Treatment Team:   Angela Castro is a 76 y.o. female patient admitted from ED awake, alert  & orientated  X 3,  Full Code, VSS - Blood pressure (!) 114/58, pulse 65, temperature 97.5 F (36.4 C), resp. rate 16, height 5' (1.524 m), weight 75.3 kg (166 lb), SpO2 97 %.,  no c/o shortness of breath, no c/o chest pain, no distress noted. Tele # 24 placed and pt is currently running:normal sinus rhythm.   IV site WDL:  Right upper arm PICC with a transparent dsg that's clean dry and intact.  Allergies:   Allergies  Allergen Reactions  . Sulfa Antibiotics Shortness Of Breath and Rash  . Adhesive [Tape] Other (See Comments)    Causes redness, please use paper tape  . Keflex [Cephalexin] Other (See Comments)    Possibly caused lethargy (taken with Flagyl)  . Metronidazole Other (See Comments)    Possibly caused lethargy (taken with keflex)  . Teflaro [Ceftaroline] Other (See Comments)    Lethargy, possibly rash     Past Medical History:  Diagnosis Date  . Asthma   . Diabetes mellitus without complication ([place])   . Hypertension     History:  obtained from ED Nurse. Tobacco/alcohol: denied none  Pt orientation to unit, room and routine. Information packet given to patient/family and safety video watched.  Admission INP armband ID verified with patient/family, and in place. SR up x 2, fall risk assessment complete with Patient and family verbalizing understanding of risks associated with falls. Pt verbalizes an understanding of how to use the call bell and to call for help before getting out of bed.  Skin, clean-dry- intact without evidence of bruising, or skin tears.   No evidence of skin break down noted on exam. Red rash on legs and arms with appearance of reaction to antibiotic. Diabetic foot ulcer to ball of left foot. Wrapped with ace bandage after  assessed.    Will cont to monitor and assist as needed.  Salley Slaughter, RN 08/24/2016 7:38 PM

## 2016-08-24 NOTE — ED Notes (Addendum)
Bedside report given to [facility], Therapist, sports. Picc line currently getting saline infusion at 40cc/hr with remainder of 1 L bolus IV fluid. PT requests peanut butter crackers.

## 2016-08-24 NOTE — ED Triage Notes (Addendum)
Pt brought in by family for fever and wound infection. Pt has wound to left foot and was receiving IV medication through a PICC line. Pt appeared "Out of it" per family and was seen by PMD yesterday when they discontinued the IV medication. Per family pt temperature was 101.5 last night. Family also reports that pt has a rash to arms and legs x 2 days.

## 2016-08-24 NOTE — ED Notes (Signed)
PT taken to restroom via wheelchair. PT urinates 150 cc. Sample sent to lab. PT taken back to room via wheelchair and placed back on monitor.

## 2016-08-24 NOTE — H&P (Signed)
[facility] History and Physical  Angela Castro XAJ:[id number] DOB: 1940/12/16 DOA: 08/24/2016  Referring physician:  PCP: Pcp Not In System   Chief Complaint: "I just got so tired."  HPI: Angela Castro is a 76 y.o. female with past medical history asthma, diabetes, hypertension presents emergency room with chief complaint of fatigue and rash. Patient has been treated outpatient with a PICC line and teflaro. Patient progressively became weaker after this medication. Recently developed a drug rash. Patient called her nfectious disease doctor who advised stopping medication. Of note patient's dm wound has been cared for at a local wound clinic for some time and they have taken repeated cultures.   Review of Systems:  As per HPI otherwise 10 point review of systems negative.    Past Medical History:  Diagnosis Date  . Asthma   . Diabetes mellitus without complication ([place])   . Hypertension    Past Surgical History:  Procedure Laterality Date  . IR GENERIC HISTORICAL  07/30/2016   IR US GUIDE VASC ACCESS RIGHT 07/30/2016 Markus Daft, MD [facility]  . IR GENERIC HISTORICAL  07/30/2016   IR FLUORO GUIDE CV MIDLINE PICC RIGHT 07/30/2016 Markus Daft, MD [facility]  . KNEE ARTHROSCOPY     x 4  . REPLACEMENT TOTAL KNEE     Social History:  reports that she has never smoked. She has never used smokeless tobacco. She reports that she does not drink alcohol or use drugs.  Allergies  Allergen Reactions  . Sulfa Antibiotics     No pertinent family history  Prior to Admission medications   Medication Sig Start Date End Date Taking? Authorizing Provider  allopurinol (ZYLOPRIM) 100 MG tablet Take 100 mg by mouth daily.    Historical Provider, MD  amLODipine (NORVASC) 5 MG tablet Take 5 mg by mouth daily.    Historical Provider, MD  aspirin EC 81 MG tablet Take 81 mg by mouth daily.    Historical Provider, MD  bisoprolol (ZEBETA) 10 MG tablet Take 10 mg by mouth daily.    Historical  Provider, MD  calcium carbonate (OS-CAL - DOSED IN MG OF ELEMENTAL CALCIUM) 1250 (500 Ca) MG tablet Take 1 tablet by mouth.    Historical Provider, MD  ceftaroline (TEFLARO) IVPB Inject into the vein every 12 (twelve) hours.    Historical Provider, MD  clobetasol (TEMOVATE) 0.05 % GEL Apply topically 2 (two) times daily.    Historical Provider, MD  fluocinonide cream (LIDEX) 0.94 % Apply 1 application topically 2 (two) times daily.    Historical Provider, MD  gabapentin (NEURONTIN) 300 MG capsule Take 300 mg by mouth 3 (three) times daily.    Historical Provider, MD  Insulin Glargine (LANTUS SOLOSTAR) 100 UNIT/ML Solostar Pen Inject into the skin daily at 10 pm.    Historical Provider, MD  linagliptin (TRADJENTA) 5 MG TABS tablet Take 5 mg by mouth daily.    Historical Provider, MD  loratadine (CLARITIN) 10 MG tablet Take 10 mg by mouth daily.    Historical Provider, MD  losartan (COZAAR) 100 MG tablet Take 100 mg by mouth daily.    Historical Provider, MD  simvastatin (ZOCOR) 5 MG tablet Take 5 mg by mouth daily.    Historical Provider, MD  traMADol (ULTRAM) 50 MG tablet Take 50 mg by mouth every 6 (six) hours as needed.    Historical Provider, MD  vitamin C (ASCORBIC ACID) 500 MG tablet Take 500 mg by mouth daily.    Historical Provider, MD  Physical Exam: Vitals:   08/24/16 1445 08/24/16 1515 08/24/16 1530 08/24/16 1600  BP: 100/61 (!) 99/51 95/62 (!) 98/53  Pulse: 62 61 61 63  Resp: 10 11 19 14   Temp:      TempSrc:      SpO2: 95% 96% 96% 96%  Weight:      Height:        Wt Readings from Last 3 Encounters:  08/24/16 75.3 kg (166 lb)  08/19/16 75.3 kg (166 lb)  07/30/16 69.9 kg (154 lb)    General:  Appears calm and comfortable, Alert and oriented 3 Eyes:  PERRL, EOMI, normal lids, iris ENT:  grossly normal hearing, lips & tongue Neck:  no LAD, masses or thyromegaly Cardiovascular:  RRR, no m/r/g. No LE edema.  Respiratory:  CTA bilaterally, no w/r/r. Normal respiratory  effort. Abdomen:  soft, ntnd Skin:  Diffuse macular papular fine drug rash all over body, left foot medial fluids diabetic ulcer along distal midfoot wound is deep Musculoskeletal:  grossly normal tone BUE/BLE Psychiatric:  grossly normal mood and affect, speech fluent and appropriate Neurologic:  CN 2-12 grossly intact, moves all extremities in coordinated fashion.          Labs on Admission:  Basic Metabolic Panel:  Recent Labs Lab 08/24/16 1218  NA 142  K 4.6  CL 108  CO2 25  GLUCOSE 110*  BUN 48*  CREATININE 3.75*  CALCIUM 8.6*   Liver Function Tests:  Recent Labs Lab 08/24/16 1218  AST 30  ALT 23  ALKPHOS 65  BILITOT 0.6  PROT 5.7*  ALBUMIN 2.5*   No results for input(s): LIPASE, AMYLASE in the last 168 hours. No results for input(s): AMMONIA in the last 168 hours. CBC:  Recent Labs Lab 08/24/16 1218  WBC 4.7  NEUTROABS 2.2  HGB 9.6*  HCT 31.0*  MCV 89.6  PLT 180   Cardiac Enzymes: No results for input(s): CKTOTAL, CKMB, CKMBINDEX, TROPONINI in the last 168 hours.  BNP (last 3 results) No results for input(s): BNP in the last 8760 hours.  ProBNP (last 3 results) No results for input(s): PROBNP in the last 8760 hours.   Serum creatinine: 3.75 mg/dL (H) 08/24/16 1218 Estimated creatinine clearance: 11.7 mL/min (A)  CBG: No results for input(s): GLUCAP in the last 168 hours.  Radiological Exams on Admission: Dg Chest 2 View  Result Date: 08/24/2016 CLINICAL DATA:  Fever EXAM: CHEST  2 VIEW COMPARISON:  07/30/2016 FINDINGS: Cardiomediastinal silhouette is stable. Mild streaky left basilar atelectasis or scarring. No infiltrate or pleural effusion. There is right arm PICC line with tip in SVC. IMPRESSION: No infiltrate or pleural effusion. Mild streaky bilateral basilar atelectasis or scarring. Right arm PICC line with tip in SVC. Electronically Signed   By: Lahoma Crocker M.D.   On: 08/24/2016 14:16   Ct Head Wo Contrast  Result Date:  08/24/2016 CLINICAL DATA:  76 year old female with altered mental status. EXAM: CT HEAD WITHOUT CONTRAST TECHNIQUE: Contiguous axial images were obtained from the base of the skull through the vertex without intravenous contrast. COMPARISON:  None. FINDINGS: Brain: No evidence of acute infarction, hemorrhage, hydrocephalus, extra-axial collection or mass lesion/mass effect. Mild generalized cerebral volume loss noted. Vascular: No hyperdense vessel or unexpected calcification. Skull: Normal. Negative for fracture or focal lesion. Sinuses/Orbits: No acute finding. Other: None. IMPRESSION: No evidence of acute intracranial abnormality. Electronically Signed   By: Margarette Canada M.D.   On: 08/24/2016 14:22   Dg Toe Great Left  Result Date: 08/24/2016 CLINICAL DATA:  Open wound of the proximal great toe. EXAM: LEFT GREAT TOE COMPARISON:  06/12/2016 radiographs and 07/02/2016 MR FINDINGS: Fragmentation and osteolysis of the first metatarsal head is compatible with osteomyelitis. There is apparent cortical loss at the base of the great toe proximal phalanx worrisome for acute osteomyelitis as well. No soft tissue gas is identified. IMPRESSION: Osteomyelitis of the first metatarsal head and base of the great toe proximal phalanx. Electronically Signed   By: Margarette Canada M.D.   On: 08/24/2016 14:18    EKG: pending  Assessment/Plan Principal Problem:   Osteomyelitis (HCC) Active Problems:   Weakness   Diabetes mellitus type 2 in obese (HCC)   Osteomyelitis L foot Infectious disease consultation Advised by the on-call ID physician to start patient on daptomycin, CK level checked Blood culture drawn  Weakness PT consult in AM Hold gabapentin, claritin, ultram  Diabetes Sliding scale insulin Hold LA insulin & Tradjenta  Hypertension When necessary hydralazine 10 mg IV as needed for severe blood pressure Hold zebeta, cozaar due to low BP  AKI Baseline Cr 2.24, Cr on admit 3.75 1L of normal saline  given in the emergency room Gentle hydration overnight Checking magnesium and phosphorus Urine labs ordered to calculate fractional excretion of sodium   Asthma Prn albuterol  Gout Cont allopurinol  Rash Hold steroid cream  Hyperlipidemia Hold zocor   Code Status: FULL DVT Prophylaxis: lovenox Family Communication: husband at bedside Disposition Plan: Pending Improvement  Status: tele, inpt  Elwin Mocha, MD Family Medicine [facility] [email or web address] Password [email or web address]

## 2016-08-24 NOTE — ED Provider Notes (Signed)
[facility] DEPT Provider Note   CSN: [id number] Arrival date & time: 08/24/16  1145     History   Chief Complaint Chief Complaint  Patient presents with  . Fever  . Wound Infection  . Rash    HPI Angela Castro is a 76 y.o. female.  HPI Patient presents with her husband who assists with the history of present illness. They note a long, carried course of treatment for a nonhealing left foot ulcer. About 2 months ago the patient began a new medication. Other than that, no other recent medication changes, diet changes, activity change. Husband notes over the past weeks the patient has become increasingly somnolent, less interactive, with mild disorientation. The patient herself describes discomfort in her left foot, is otherwise slow to interact, but denies other pain, overt confusion. During this illness the patient has been seen by multiple providers including wound care specialist, infectious disease specialist. Evaluation studies have included MRI, cultures. Patient is currently receiving IV antibiotics, twice daily through a right-sided PICC line.  Past Medical History:  Diagnosis Date  . Asthma   . Diabetes mellitus without complication ([place])   . Hypertension     Patient Active Problem List   Diagnosis Date Noted  . Foot ulcer with fat layer exposed, left ([place]) 08/19/2016  . Medication monitoring encounter 08/19/2016  . Chronic osteomyelitis ([place]) 07/29/2016  . Diabetes mellitus, type II, insulin dependent ([place]) 07/14/2016  . Gout 07/14/2016  . Hypertension, essential 07/14/2016  . Generalized osteoarthritis 07/14/2016    Past Surgical History:  Procedure Laterality Date  . IR GENERIC HISTORICAL  07/30/2016   IR US GUIDE VASC ACCESS RIGHT 07/30/2016 Markus Daft, MD [facility]  . IR GENERIC HISTORICAL  07/30/2016   IR FLUORO GUIDE CV MIDLINE PICC RIGHT 07/30/2016 Markus Daft, MD [facility]  . KNEE ARTHROSCOPY     x 4  . REPLACEMENT TOTAL KNEE      OB  History    No data available       Home Medications    Prior to Admission medications   Medication Sig Start Date End Date Taking? Authorizing Provider  allopurinol (ZYLOPRIM) 100 MG tablet Take 100 mg by mouth daily.    Historical Provider, MD  amLODipine (NORVASC) 5 MG tablet Take 5 mg by mouth daily.    Historical Provider, MD  aspirin EC 81 MG tablet Take 81 mg by mouth daily.    Historical Provider, MD  bisoprolol (ZEBETA) 10 MG tablet Take 10 mg by mouth daily.    Historical Provider, MD  calcium carbonate (OS-CAL - DOSED IN MG OF ELEMENTAL CALCIUM) 1250 (500 Ca) MG tablet Take 1 tablet by mouth.    Historical Provider, MD  ceftaroline (TEFLARO) IVPB Inject into the vein every 12 (twelve) hours.    Historical Provider, MD  clobetasol (TEMOVATE) 0.05 % GEL Apply topically 2 (two) times daily.    Historical Provider, MD  fluocinonide cream (LIDEX) 3.66 % Apply 1 application topically 2 (two) times daily.    Historical Provider, MD  gabapentin (NEURONTIN) 300 MG capsule Take 300 mg by mouth 3 (three) times daily.    Historical Provider, MD  Insulin Glargine (LANTUS SOLOSTAR) 100 UNIT/ML Solostar Pen Inject into the skin daily at 10 pm.    Historical Provider, MD  linagliptin (TRADJENTA) 5 MG TABS tablet Take 5 mg by mouth daily.    Historical Provider, MD  loratadine (CLARITIN) 10 MG tablet Take 10 mg by mouth daily.    Historical  Provider, MD  losartan (COZAAR) 100 MG tablet Take 100 mg by mouth daily.    Historical Provider, MD  simvastatin (ZOCOR) 5 MG tablet Take 5 mg by mouth daily.    Historical Provider, MD  traMADol (ULTRAM) 50 MG tablet Take 50 mg by mouth every 6 (six) hours as needed.    Historical Provider, MD  vitamin C (ASCORBIC ACID) 500 MG tablet Take 500 mg by mouth daily.    Historical Provider, MD    Family History No family history on file.  Social History Social History  Substance Use Topics  . Smoking status: Never Smoker  . Smokeless tobacco: Never Used    . Alcohol use No     Allergies   Sulfa antibiotics   Review of Systems Review of Systems  Constitutional:       Per HPI, otherwise negative  HENT:       Per HPI, otherwise negative  Respiratory:       Per HPI, otherwise negative  Cardiovascular:       Per HPI, otherwise negative  Gastrointestinal: Negative for vomiting.  Endocrine:       Negative aside from HPI  Genitourinary:       Neg aside from HPI   Musculoskeletal:       Per HPI, otherwise negative  Skin: Positive for wound.  Allergic/Immunologic: Negative for immunocompromised state.  Neurological: Positive for weakness. Negative for syncope.  Psychiatric/Behavioral: Positive for decreased concentration.     Physical Exam Updated Vital Signs BP 100/61   Pulse 62   Temp 97.4 F (36.3 C)   Resp 10   Ht 5' (1.524 m)   Wt 166 lb (75.3 kg)   SpO2 95%   BMI 32.42 kg/m   Physical Exam  Constitutional: She is oriented to person, place, and time. She appears well-developed and well-nourished. She appears listless. She has a sickly appearance. No distress.  HENT:  Head: Normocephalic and atraumatic.  Eyes: Conjunctivae and EOM are normal.  Cardiovascular: Normal rate and regular rhythm.   Pulmonary/Chest: Effort normal and breath sounds normal. No stridor. No respiratory distress.  Abdominal: She exhibits no distension.  Musculoskeletal: She exhibits no edema.       Arms:      Left foot: There is deformity.       Feet:  Feet:  Left Foot:  Skin Integrity: Positive for ulcer, skin breakdown, erythema and warmth.  Neurological: She is oriented to person, place, and time. She appears listless. No cranial nerve deficit.  Skin: Skin is warm and dry.  Psychiatric: She has a normal mood and affect.  Nursing note and vitals reviewed.    ED Treatments / Results  Labs (all labs ordered are listed, but only abnormal results are displayed) Labs Reviewed  COMPREHENSIVE METABOLIC PANEL - Abnormal; Notable for  the following:       Result Value   Glucose, Bld 110 (*)    BUN 48 (*)    Creatinine, Ser 3.75 (*)    Calcium 8.6 (*)    Total Protein 5.7 (*)    Albumin 2.5 (*)    GFR calc non Af Amer 11 (*)    GFR calc Af Amer 13 (*)    All other components within normal limits  CBC WITH DIFFERENTIAL/PLATELET - Abnormal; Notable for the following:    RBC 3.46 (*)    Hemoglobin 9.6 (*)    HCT 31.0 (*)    Eosinophils Absolute 1.2 (*)    All  other components within normal limits  PROTIME-INR - Abnormal; Notable for the following:    Prothrombin Time 15.8 (*)    All other components within normal limits  CULTURE, BLOOD (ROUTINE X 2)  URINALYSIS, ROUTINE W REFLEX MICROSCOPIC  I-STAT CG4 LACTIC ACID, ED  I-STAT CG4 LACTIC ACID, ED    EKG  EKG Interpretation None       Radiology Dg Chest 2 View  Result Date: 08/24/2016 CLINICAL DATA:  Fever EXAM: CHEST  2 VIEW COMPARISON:  07/30/2016 FINDINGS: Cardiomediastinal silhouette is stable. Mild streaky left basilar atelectasis or scarring. No infiltrate or pleural effusion. There is right arm PICC line with tip in SVC. IMPRESSION: No infiltrate or pleural effusion. Mild streaky bilateral basilar atelectasis or scarring. Right arm PICC line with tip in SVC. Electronically Signed   By: Lahoma Crocker M.D.   On: 08/24/2016 14:16   Ct Head Wo Contrast  Result Date: 08/24/2016 CLINICAL DATA:  76 year old female with altered mental status. EXAM: CT HEAD WITHOUT CONTRAST TECHNIQUE: Contiguous axial images were obtained from the base of the skull through the vertex without intravenous contrast. COMPARISON:  None. FINDINGS: Brain: No evidence of acute infarction, hemorrhage, hydrocephalus, extra-axial collection or mass lesion/mass effect. Mild generalized cerebral volume loss noted. Vascular: No hyperdense vessel or unexpected calcification. Skull: Normal. Negative for fracture or focal lesion. Sinuses/Orbits: No acute finding. Other: None. IMPRESSION: No evidence  of acute intracranial abnormality. Electronically Signed   By: Margarette Canada M.D.   On: 08/24/2016 14:22   Dg Toe Great Left  Result Date: 08/24/2016 CLINICAL DATA:  Open wound of the proximal great toe. EXAM: LEFT GREAT TOE COMPARISON:  06/12/2016 radiographs and 07/02/2016 MR FINDINGS: Fragmentation and osteolysis of the first metatarsal head is compatible with osteomyelitis. There is apparent cortical loss at the base of the great toe proximal phalanx worrisome for acute osteomyelitis as well. No soft tissue gas is identified. IMPRESSION: Osteomyelitis of the first metatarsal head and base of the great toe proximal phalanx. Electronically Signed   By: Margarette Canada M.D.   On: 08/24/2016 14:18    Procedures Procedures (including critical care time)  Medications Ordered in ED Medications  vancomycin (VANCOCIN) 1,500 mg in sodium chloride 0.9 % 500 mL IVPB (not administered)  0.9 %  sodium chloride infusion (not administered)  sodium chloride 0.9 % bolus 1,000 mL (0 mLs Intravenous Stopped 08/24/16 1430)   Initial evaluation studies notable for acute kidney injury, with creatinine 3.75, up 50% since 2 weeks ago.  Patient has been received fluid resuscitation, also necessary due to the initial hypotension.  Initial Impression / Assessment and Plan / ED Course  I have reviewed the triage vital signs and the nursing notes.  Pertinent labs & imaging results that were available during my care of the patient were reviewed by me and considered in my medical decision making (see chart for details).   On repeat exam the patient is in similar condition. I discussed the findings thus far with her, including concern for renal dysfunction, with additional concern for osteomyelitis. Patient's blood pressure has improved, and AP 70. She will continue to receive fluid resuscitation, in addition to vancomycin, which will be considered with our pharmacy colleagues can the patient's new acute kidney  injury.  With concern for [place] to mellitus, acute kidney injury, altered mental status, the patient will require admission for further evaluation and management.   Final Clinical Impressions(s) / ED Diagnoses  Altered mental status Acute kidney injury Osteomyelitis  Carmin Muskrat, MD 08/24/16 [phone]

## 2016-08-24 NOTE — ED Notes (Addendum)
Dr. Vanita Panda aware of BP, lactic acid result, and white cell count

## 2016-08-24 NOTE — ED Notes (Signed)
PT returns from radiology

## 2016-08-24 NOTE — Progress Notes (Signed)
Pharmacy Antibiotic Note  Angela Castro is a 76 y.o. female admitted on 08/24/2016 with increased somnolence and confusion with possible chronic osteomyelitis of foot from non-healing wound.  WBC wnl. Currently afebrile. LA wnl. Patient has been receiving IV Teflaro (Ceftaroline) for 6 weeks (started on 07/29/16) via R-sided PICC twice daily per ID clinic (Dr. Linus Salmons) but this was stopped by yesterday per RN chart note by patient's primary. Last seen at [facility] on 08/19/16. RN notes new rash to arms and legs x2 days. New fever reported overnight of 101.5. Allergy listed to sulfa antibiotics. Pharmacy has been consulted for vancomycin dosing.  AKI: SCr 2.13 (4/2) >> 3.75 (4/15) which is doubled over 2 weeks. CrCl ~ 11.7 mL/min.   Plan: Vancomycin loading dose of 1500 mg IV x1.  Will hold off on further dosing due to changing renal function.  Follow-up renal function, clinical status, and culture results.   Height: 5' (152.4 cm) Weight: 166 lb (75.3 kg) IBW/kg (Calculated) : 45.5  Temp (24hrs), Avg:97.5 F (36.4 C), Min:97.4 F (36.3 C), Max:97.5 F (36.4 C)   Recent Labs Lab 08/24/16 1218 08/24/16 1229  WBC 4.7  --   CREATININE 3.75*  --   LATICACIDVEN  --  1.44    Estimated Creatinine Clearance: 11.7 mL/min (A) (by C-G formula based on SCr of 3.75 mg/dL (H)).    Allergies  Allergen Reactions  . Sulfa Antibiotics     Antimicrobials this admission: Vancomycin 4/15 >>  Dose adjustments this admission:   Microbiology results: 4/15 BCx:   Thank you for allowing pharmacy to be a part of this patient's care.  Sloan Leiter, PharmD, BCPS Clinical Pharmacist Clinical phone 08/24/2016 until 3:30 PM - [phone] After hours, please call [phone] 08/24/2016 2:44 PM

## 2016-08-25 DIAGNOSIS — Z7982 Long term (current) use of aspirin: Secondary | ICD-10-CM

## 2016-08-25 DIAGNOSIS — Z794 Long term (current) use of insulin: Secondary | ICD-10-CM

## 2016-08-25 DIAGNOSIS — E1169 Type 2 diabetes mellitus with other specified complication: Principal | ICD-10-CM

## 2016-08-25 DIAGNOSIS — N181 Chronic kidney disease, stage 1: Secondary | ICD-10-CM

## 2016-08-25 DIAGNOSIS — N170 Acute kidney failure with tubular necrosis: Secondary | ICD-10-CM

## 2016-08-25 DIAGNOSIS — Z79899 Other long term (current) drug therapy: Secondary | ICD-10-CM

## 2016-08-25 DIAGNOSIS — M868X8 Other osteomyelitis, other site: Secondary | ICD-10-CM

## 2016-08-25 DIAGNOSIS — E669 Obesity, unspecified: Secondary | ICD-10-CM

## 2016-08-25 DIAGNOSIS — M8600 Acute hematogenous osteomyelitis, unspecified site: Secondary | ICD-10-CM

## 2016-08-25 DIAGNOSIS — N184 Chronic kidney disease, stage 4 (severe): Secondary | ICD-10-CM

## 2016-08-25 LAB — GLUCOSE, CAPILLARY
Glucose-Capillary: 100 mg/dL — ABNORMAL HIGH (ref 65–99)
Glucose-Capillary: 122 mg/dL — ABNORMAL HIGH (ref 65–99)

## 2016-08-25 LAB — CBC
HEMATOCRIT: 29.2 % — AB (ref 36.0–46.0)
Hemoglobin: 9 g/dL — ABNORMAL LOW (ref 12.0–15.0)
MCH: 27.5 pg (ref 26.0–34.0)
MCHC: 30.8 g/dL (ref 30.0–36.0)
MCV: 89.3 fL (ref 78.0–100.0)
Platelets: 110 10*3/uL — ABNORMAL LOW (ref 150–400)
RBC: 3.27 MIL/uL — ABNORMAL LOW (ref 3.87–5.11)
RDW: 14.2 % (ref 11.5–15.5)
WBC: 4.2 10*3/uL (ref 4.0–10.5)

## 2016-08-25 LAB — BASIC METABOLIC PANEL
Anion gap: 7 (ref 5–15)
BUN: 37 mg/dL — AB (ref 6–20)
CALCIUM: 8.3 mg/dL — AB (ref 8.9–10.3)
CO2: 23 mmol/L (ref 22–32)
CREATININE: 2.68 mg/dL — AB (ref 0.44–1.00)
Chloride: 113 mmol/L — ABNORMAL HIGH (ref 101–111)
GFR calc Af Amer: 19 mL/min — ABNORMAL LOW (ref >60)
GFR calc non Af Amer: 16 mL/min — ABNORMAL LOW (ref >60)
GLUCOSE: 107 mg/dL — AB (ref 65–99)
Potassium: 4.9 mmol/L (ref 3.5–5.1)
Sodium: 143 mmol/L (ref 135–145)

## 2016-08-25 LAB — SURGICAL PCR SCREEN
MRSA, PCR: NEGATIVE
Staphylococcus aureus: NEGATIVE

## 2016-08-25 LAB — C DIFFICILE QUICK SCREEN W PCR REFLEX
C DIFFICILE (CDIFF) TOXIN: NEGATIVE
C Diff antigen: NEGATIVE
C Diff interpretation: NOT DETECTED

## 2016-08-25 MED ORDER — ZOLPIDEM TARTRATE 5 MG PO TABS
5.0000 mg | ORAL_TABLET | Freq: Every evening | ORAL | Status: DC | PRN
Start: 2016-08-25 — End: 2016-08-28
  Administered 2016-08-25 – 2016-08-27 (×3): 5 mg via ORAL
  Filled 2016-08-25 (×3): qty 1

## 2016-08-25 MED ORDER — POVIDONE-IODINE 10 % EX SWAB: 2.0000 "application " | Freq: Once | CUTANEOUS | Status: DC

## 2016-08-25 MED ORDER — HYDROCORTISONE 2.5 % RE CREA
TOPICAL_CREAM | Freq: Three times a day (TID) | RECTAL | Status: DC
  Administered 2016-08-26 – 2016-08-28 (×4): via RECTAL
  Filled 2016-08-25: qty 28.35

## 2016-08-25 MED ORDER — SACCHAROMYCES BOULARDII 250 MG PO CAPS
250.0000 mg | ORAL_CAPSULE | Freq: Two times a day (BID) | ORAL | Status: DC
Start: 2016-08-25 — End: 2016-08-28
  Administered 2016-08-25 – 2016-08-28 (×5): 250 mg via ORAL
  Filled 2016-08-25 (×5): qty 1

## 2016-08-25 MED ORDER — INSULIN ASPART 100 UNIT/ML ~~LOC~~ SOLN
0.0000 [IU] | Freq: Three times a day (TID) | SUBCUTANEOUS | Status: DC
  Administered 2016-08-25: 1 [IU] via SUBCUTANEOUS
  Administered 2016-08-27 – 2016-08-28 (×3): 2 [IU] via SUBCUTANEOUS

## 2016-08-25 MED ORDER — CHLORHEXIDINE GLUCONATE 4 % EX LIQD
60.0000 mL | Freq: Once | CUTANEOUS | Status: AC
  Administered 2016-08-26: 4 via TOPICAL
  Filled 2016-08-25: qty 60

## 2016-08-25 MED ORDER — LEVOFLOXACIN IN D5W 500 MG/100ML IV SOLN
500.0000 mg | INTRAVENOUS | Status: DC
  Administered 2016-08-25: 500 mg via INTRAVENOUS
  Filled 2016-08-25: qty 100

## 2016-08-25 MED ORDER — ALLOPURINOL 100 MG PO TABS
100.0000 mg | ORAL_TABLET | Freq: Two times a day (BID) | ORAL | Status: DC
  Administered 2016-08-25 – 2016-08-28 (×4): 100 mg via ORAL
  Filled 2016-08-25 (×5): qty 1

## 2016-08-25 MED ORDER — LINEZOLID 600 MG/300ML IV SOLN
600.0000 mg | Freq: Two times a day (BID) | INTRAVENOUS | Status: DC
  Administered 2016-08-25 – 2016-08-27 (×4): 600 mg via INTRAVENOUS
  Filled 2016-08-25 (×4): qty 300

## 2016-08-25 MED ORDER — KCL IN DEXTROSE-NACL 20-5-0.45 MEQ/L-%-% IV SOLN
INTRAVENOUS | Status: DC
  Administered 2016-08-26 (×2): via INTRAVENOUS
  Filled 2016-08-25: qty 1000

## 2016-08-25 NOTE — Consult Note (Signed)
[facility] Nurse has reviewed record and this patient has a positive xray or MRI for osteomyelitis, this is considered outside of the scope of practice for the [facility] nurse, for that reason [facility] Nurse will not consult.   Re-consult if only topical wound care needed after orthopedic or surgical evaluation Angela Castro, Angela Castro

## 2016-08-25 NOTE — Progress Notes (Addendum)
[facility] PROGRESS NOTE  Angela Castro CMK:[id number] DOB: [birth year] DOA: 08/24/2016   PCP: Pcp Not In System     Assessment/Plan: Principal Problem:   Osteomyelitis ([place]) Active Problems:   Weakness   Diabetes mellitus type 2 in obese [facility])   Acute-on-chronic renal failure ([place])    76 year old female with a history of hypertension, diabetes, nonhealing left foot ulcer, followed by infectious disease, status post receiving teflaro. She presented 4/15 with fever and wound infection. Patient had a fever of 101.5 last night  Assessment and plan Osteomyelitis L foot Discussed with orthopedics Dr.Hewitt, will keep patient NPO, pending further recommendations Advised by the on-call ID physician to start patient on daptomycin, CK level checked Patient now complaining of diarrhea on daptomycin, hold daptomycin pending further ID recommendations-I have contacted Dr. Johnnye Sima Blood culture 2 no growth so far Check ABI  Generalized weakness Likely secondary to #1 PT consult in AM Hold gabapentin, claritin, ultram  Diabetes Sliding scale insulin Hold LA insulin & Tradjenta Will check hemoglobin A1c  Hypertension When necessary hydralazine 10 mg IV as needed for severe blood pressure Hold zebeta, cozaar due to low BP  AKI Baseline Cr 2.24, Cr on admit 3.75, now 2.68 after IV fluids 1L of normal saline given in the emergency room Gentle hydration overnight Checking magnesium and phosphorus Urine labs ordered to calculate fractional excretion of sodium   Asthma Prn albuterol  Gout Cont allopurinol  Rash Hold steroid cream  Hyperlipidemia Hold zocor     DVT prophylaxsis Lovenox on hold as per orthopedics  Code Status:  Full code    Family Communication: Discussed in detail with the patient, all imaging results, lab results explained to the patient   Disposition Plan:  Orthopedic consultation     Consultants:  Orthopedic  consultation  Procedures:  None  Antibiotics: Anti-infectives    Start     Dose/Rate Route Frequency Ordered Stop   08/24/16 1700  DAPTOmycin (CUBICIN) 600 mg in sodium chloride 0.9 % IVPB  Status:  Discontinued     600 mg 224 mL/hr over 30 Minutes Intravenous Every 24 hours 08/24/16 1554 08/24/16 1613   08/24/16 1615  DAPTOmycin (CUBICIN) 600 mg in sodium chloride 0.9 % IVPB     600 mg 224 mL/hr over 30 Minutes Intravenous Every 48 hours 08/24/16 1613     08/24/16 1515  vancomycin (VANCOCIN) 1,500 mg in sodium chloride 0.9 % 500 mL IVPB     1,500 mg 250 mL/hr over 120 Minutes Intravenous  Once 08/24/16 1503 08/24/16 1754         HPI/Subjective: Main complaint is diarrhea, 5 episodes today  Objective: Vitals:   08/24/16 1700 08/24/16 1803 08/24/16 2321 08/25/16 0505  BP: (!) 110/50 (!) 114/58 (!) 112/44 (!) 102/48  Pulse: 63 65 64 71  Resp: 12 16 18 18   Temp:  97.5 F (36.4 C) 97.6 F (36.4 C) 98.1 F (36.7 C)  TempSrc:    Oral  SpO2: 95% 97% 94% 99%  Weight:      Height:        Intake/Output Summary (Last 24 hours) at 08/25/16 1130 Last data filed at 08/25/16 0952  Gross per 24 hour  Intake           2349.5 ml  Output              400 ml  Net           1949.5 ml  Exam:  Examination:  General exam: Appears calm and comfortable  Respiratory system: Clear to auscultation. Respiratory effort normal. Cardiovascular system: S1 & S2 heard, RRR. No JVD, murmurs, rubs, gallops or clicks. No pedal edema. Gastrointestinal system: Abdomen is nondistended, soft and nontender. No organomegaly or masses felt. Normal bowel sounds heard. Central nervous system: Alert and oriented. No focal neurological deficits. Extremities: Symmetric 5 x 5 power. Skin: No rashes, lesions or ulcers Psychiatry: Judgement and insight appear normal. Mood & affect appropriate.     Data Reviewed: I have personally reviewed following labs and imaging studies  Micro Results No results  found for this or any previous visit (from the past 240 hour(s)).  Radiology Reports Dg Chest 2 View  Result Date: 08/24/2016 CLINICAL DATA:  Fever EXAM: CHEST  2 VIEW COMPARISON:  07/30/2016 FINDINGS: Cardiomediastinal silhouette is stable. Mild streaky left basilar atelectasis or scarring. No infiltrate or pleural effusion. There is right arm PICC line with tip in SVC. IMPRESSION: No infiltrate or pleural effusion. Mild streaky bilateral basilar atelectasis or scarring. Right arm PICC line with tip in SVC. Electronically Signed   By: Lahoma Crocker M.D.   On: 08/24/2016 14:16   Ct Head Wo Contrast  Result Date: 08/24/2016 CLINICAL DATA:  76 year old female with altered mental status. EXAM: CT HEAD WITHOUT CONTRAST TECHNIQUE: Contiguous axial images were obtained from the base of the skull through the vertex without intravenous contrast. COMPARISON:  None. FINDINGS: Brain: No evidence of acute infarction, hemorrhage, hydrocephalus, extra-axial collection or mass lesion/mass effect. Mild generalized cerebral volume loss noted. Vascular: No hyperdense vessel or unexpected calcification. Skull: Normal. Negative for fracture or focal lesion. Sinuses/Orbits: No acute finding. Other: None. IMPRESSION: No evidence of acute intracranial abnormality. Electronically Signed   By: Margarette Canada M.D.   On: 08/24/2016 14:22   Ir US Guide Vasc Access Right  Result Date: 07/30/2016 INDICATION: 76 year old with left foot ulcer and possible osteomyelitis. PICC line needed for antibiotics. EXAM: RIGHT ARM PICC LINE PLACEMENT WITH ULTRASOUND AND FLUOROSCOPIC GUIDANCE MEDICATIONS: None ANESTHESIA/SEDATION: None FLUOROSCOPY TIME:  Fluoroscopy Time: 18 seconds, 0.6 mGy COMPLICATIONS: None immediate. PROCEDURE: The patient was advised of the possible risks and complications and agreed to undergo the procedure. The patient was then brought to the angiographic suite for the procedure. The right arm was prepped with chlorhexidine,  draped in the usual sterile fashion using maximum barrier technique (cap and mask, sterile gown, sterile gloves, large sterile sheet, hand hygiene and cutaneous antisepsis) and infiltrated locally with 1% Lidocaine. Ultrasound demonstrated patency of the right basilic vein, and this was documented with an image. Under real-time ultrasound guidance, this vein was accessed with a 21 gauge micropuncture needle and image documentation was performed. A 0.018 wire was introduced in to the vein. Over this, a 5 [place] single lumen power injectable PICC was advanced to the lower SVC/right atrial junction. Fluoroscopy during the procedure and fluoro spot radiograph confirms appropriate catheter position. The catheter was flushed and covered with a sterile dressing. Catheter length: 37 cm IMPRESSION: Successful right arm Power PICC line placement with ultrasound and fluoroscopic guidance. The catheter is ready for use. Electronically Signed   By: Markus Daft M.D.   On: 07/30/2016 09:47   Dg Toe Great Left  Result Date: 08/24/2016 CLINICAL DATA:  Open wound of the proximal great toe. EXAM: LEFT GREAT TOE COMPARISON:  06/12/2016 radiographs and 07/02/2016 MR FINDINGS: Fragmentation and osteolysis of the first metatarsal head is compatible with osteomyelitis. There is apparent cortical  loss at the base of the great toe proximal phalanx worrisome for acute osteomyelitis as well. No soft tissue gas is identified. IMPRESSION: Osteomyelitis of the first metatarsal head and base of the great toe proximal phalanx. Electronically Signed   By: Margarette Canada M.D.   On: 08/24/2016 14:18   Ir Fluoro Guide Cv Midline Picc Right  Result Date: 07/30/2016 INDICATION: 76 year old with left foot ulcer and possible osteomyelitis. PICC line needed for antibiotics. EXAM: RIGHT ARM PICC LINE PLACEMENT WITH ULTRASOUND AND FLUOROSCOPIC GUIDANCE MEDICATIONS: None ANESTHESIA/SEDATION: None FLUOROSCOPY TIME:  Fluoroscopy Time: 18 seconds, 0.6 mGy  COMPLICATIONS: None immediate. PROCEDURE: The patient was advised of the possible risks and complications and agreed to undergo the procedure. The patient was then brought to the angiographic suite for the procedure. The right arm was prepped with chlorhexidine, draped in the usual sterile fashion using maximum barrier technique (cap and mask, sterile gown, sterile gloves, large sterile sheet, hand hygiene and cutaneous antisepsis) and infiltrated locally with 1% Lidocaine. Ultrasound demonstrated patency of the right basilic vein, and this was documented with an image. Under real-time ultrasound guidance, this vein was accessed with a 21 gauge micropuncture needle and image documentation was performed. A 0.018 wire was introduced in to the vein. Over this, a 5 [place] single lumen power injectable PICC was advanced to the lower SVC/right atrial junction. Fluoroscopy during the procedure and fluoro spot radiograph confirms appropriate catheter position. The catheter was flushed and covered with a sterile dressing. Catheter length: 37 cm IMPRESSION: Successful right arm Power PICC line placement with ultrasound and fluoroscopic guidance. The catheter is ready for use. Electronically Signed   By: Markus Daft M.D.   On: 07/30/2016 09:47     CBC  Recent Labs Lab 08/24/16 1218 08/24/16 1702 08/25/16 0430  WBC 4.7 4.3 4.2  HGB 9.6* 9.3* 9.0*  HCT 31.0* 30.5* 29.2*  PLT 180 175 110*  MCV 89.6 88.9 89.3  MCH 27.7 27.1 27.5  MCHC 31.0 30.5 30.8  RDW 14.4 14.1 14.2  LYMPHSABS 1.0  --   --   MONOABS 0.3  --   --   EOSABS 1.2*  --   --   BASOSABS 0.0  --   --     Chemistries   Recent Labs Lab 08/24/16 1218 08/24/16 1702 08/25/16 0644  NA 142  --  143  K 4.6  --  4.9  CL 108  --  113*  CO2 25  --  23  GLUCOSE 110*  --  107*  BUN 48*  --  37*  CREATININE 3.75* 3.48* 2.68*  CALCIUM 8.6*  --  8.3*  MG  --  1.8  --   AST 30  --   --   ALT 23  --   --   ALKPHOS 65  --   --   BILITOT 0.6  --    --    ------------------------------------------------------------------------------------------------------------------ estimated creatinine clearance is 16.4 mL/min (A) (by C-G formula based on SCr of 2.68 mg/dL (H)). ------------------------------------------------------------------------------------------------------------------ No results for input(s): HGBA1C in the last 72 hours. ------------------------------------------------------------------------------------------------------------------ No results for input(s): CHOL, HDL, LDLCALC, TRIG, CHOLHDL, LDLDIRECT in the last 72 hours. ------------------------------------------------------------------------------------------------------------------ No results for input(s): TSH, T4TOTAL, T3FREE, THYROIDAB in the last 72 hours.  Invalid input(s): FREET3 ------------------------------------------------------------------------------------------------------------------ No results for input(s): VITAMINB12, FOLATE, FERRITIN, TIBC, IRON, RETICCTPCT in the last 72 hours.  Coagulation profile  Recent Labs Lab 08/24/16 1218  INR 1.26    No results for input(s): DDIMER  in the last 72 hours.  Cardiac Enzymes No results for input(s): CKMB, TROPONINI, MYOGLOBIN in the last 168 hours.  Invalid input(s): CK ------------------------------------------------------------------------------------------------------------------ Invalid input(s): POCBNP   CBG:  Recent Labs Lab 08/24/16 1758 08/24/16 2316 08/25/16 0656  GLUCAP 119* 132* 100*       Studies: Dg Chest 2 View  Result Date: 08/24/2016 CLINICAL DATA:  Fever EXAM: CHEST  2 VIEW COMPARISON:  07/30/2016 FINDINGS: Cardiomediastinal silhouette is stable. Mild streaky left basilar atelectasis or scarring. No infiltrate or pleural effusion. There is right arm PICC line with tip in SVC. IMPRESSION: No infiltrate or pleural effusion. Mild streaky bilateral basilar atelectasis or scarring.  Right arm PICC line with tip in SVC. Electronically Signed   By: Lahoma Crocker M.D.   On: 08/24/2016 14:16   Ct Head Wo Contrast  Result Date: 08/24/2016 CLINICAL DATA:  76 year old female with altered mental status. EXAM: CT HEAD WITHOUT CONTRAST TECHNIQUE: Contiguous axial images were obtained from the base of the skull through the vertex without intravenous contrast. COMPARISON:  None. FINDINGS: Brain: No evidence of acute infarction, hemorrhage, hydrocephalus, extra-axial collection or mass lesion/mass effect. Mild generalized cerebral volume loss noted. Vascular: No hyperdense vessel or unexpected calcification. Skull: Normal. Negative for fracture or focal lesion. Sinuses/Orbits: No acute finding. Other: None. IMPRESSION: No evidence of acute intracranial abnormality. Electronically Signed   By: Margarette Canada M.D.   On: 08/24/2016 14:22   Dg Toe Great Left  Result Date: 08/24/2016 CLINICAL DATA:  Open wound of the proximal great toe. EXAM: LEFT GREAT TOE COMPARISON:  06/12/2016 radiographs and 07/02/2016 MR FINDINGS: Fragmentation and osteolysis of the first metatarsal head is compatible with osteomyelitis. There is apparent cortical loss at the base of the great toe proximal phalanx worrisome for acute osteomyelitis as well. No soft tissue gas is identified. IMPRESSION: Osteomyelitis of the first metatarsal head and base of the great toe proximal phalanx. Electronically Signed   By: Margarette Canada M.D.   On: 08/24/2016 14:18      No results found for: HGBA1C Lab Results  Component Value Date   CREATININE 2.68 (H) 08/25/2016       Scheduled Meds: . allopurinol  100 mg Oral BID  . amLODipine  5 mg Oral Daily  . aspirin EC  81 mg Oral Daily  . DAPTOmycin (CUBICIN)  IV  600 mg Intravenous Q48H  . enoxaparin (LOVENOX) injection  30 mg Subcutaneous Q24H  . sodium chloride flush  3 mL Intravenous Q12H  . vitamin C  500 mg Oral Daily   Continuous Infusions: . sodium chloride 1,000 mL (08/25/16  0847)     LOS: 1 day    Time spent: >30 MINS    Reyne Dumas  [facility] Pager [phone]. If 7PM-7AM, please contact night-coverage at [email or web address], password [facility] 08/25/2016, 11:30 AM  LOS: 1 day

## 2016-08-25 NOTE — Progress Notes (Signed)
MD was called and informed that the patient had more then 3 BM in the last 24 hrs (patient being on IV ABx), Also, patient's husband requested to talk with the doctor. Patient needed a WOC. Will continue to monitor.

## 2016-08-25 NOTE — Progress Notes (Signed)
Pharmacy Antibiotic Note  Angela Castro is a 76 y.o. female admitted on 08/24/2016 with ongoing diabetic foot infection with osteo and new rash. Patient had been on Teflaro since 3/20 for wound treatment. Her new rash appears to be r/t her Teflaro and so this has been D/C'd and added to her allergy list. Pharmacy has been consulted for levaquin dosing. She is also on Zyvox per MD. Patient is afebrile and WBC is normal. Of note, patient with AKI, SCr 2.68 and CrCl ~ 15 mL/min.   Patient for surgery tomorrow and likely transition to PO abx in the next few days. Will likely be on abx for 10 days post-surgery.   Plan: Zyvox 600 mg IV q12hr per ID MD Levaquin 500 mg IV q48hr  Monitor renal function, clinical picture, and LOT F/u transition to PO abx   Height: 5' (152.4 cm) Weight: 166 lb (75.3 kg) IBW/kg (Calculated) : 45.5  Temp (24hrs), Avg:97.8 F (36.6 C), Min:97.5 F (36.4 C), Max:98.1 F (36.7 C)   Recent Labs Lab 08/24/16 1218 08/24/16 1229 08/24/16 1702 08/24/16 1719 08/25/16 0430 08/25/16 0644  WBC 4.7  --  4.3  --  4.2  --   CREATININE 3.75*  --  3.48*  --   --  2.68*  LATICACIDVEN  --  1.44  --  0.91  --   --     Estimated Creatinine Clearance: 16.4 mL/min (A) (by C-G formula based on SCr of 2.68 mg/dL (H)).    Allergies  Allergen Reactions  . Sulfa Antibiotics Shortness Of Breath and Rash  . Adhesive [Tape] Other (See Comments)    Causes redness, please use paper tape  . Keflex [Cephalexin] Other (See Comments)    Possibly caused lethargy (taken with Flagyl)  . Metronidazole Other (See Comments)    Possibly caused lethargy (taken with keflex)  . Teflaro [Ceftaroline] Other (See Comments)    Lethargy, possibly rash    Antimicrobials this admission: Vancomycin 4/15 x1 Dapto 4/15 x1  4/16 Levaquin >>  4/16 Zyvox >>   Microbiology results: 4/15 Blood Cx: ngtd  4/16 Cdiff: pending   Argie Ramming, PharmD Pharmacy Resident  Pager [phone] 08/25/16  4:28 PM

## 2016-08-25 NOTE — Progress Notes (Signed)
[facility]  Active pt with [facility] for home IV ABX.  [facility] hospital team will follow while here at [facility] and support DC when ordered.  If patient discharges after hours, please call [phone].   Larry Sierras 08/25/2016, 1:28 PM

## 2016-08-25 NOTE — Consult Note (Signed)
Reason for Consult: Left foot chronic ulcer/osteomyelitis Referring Physician: Reyne Dumas, MD  Angela Castro is an 76 y.o. female.  HPI: Angela Castro is a 75 year old female with PMH significant for diabetic peripheral neuropathy with a most recent  A1C of 6.1, HTN, and asthma, presented to [facility] hospital on 08/24/2016 with concerns for chronic left great toe ulcer and infection.  She reports that she has had the ulcer since August of 2017.  She has been to the wound center and seen infectious disease.  She has been treated outpatient with a PICC line and teflaro.  She previously had radiographs and MRI of her L foot several weeks ago.  She notes that 9 weeks ago her ulcer seemed to worsen.  Over the last 2 days she developed fatigue and a rash across her medial L lower extremity and thigh.  She then reported to the ED.  Radiographs were obtained and the patient was placed on antibiotics.  She was then admitted to the hospital.  Radiographs were positive for osteomyelitis of the L proximal hallux and 1st metatarsal head.  Dr. Wylene Simmer was then consulted.  She reports that weightbearing and ambulation do not cause any real pain due to her peripheral neuropathy.  Nothing seems to make the ulcer better, including nonweightbearing.  She denies any other injury or insult to her L foot/ankle.  She denies thryoid disease, other autoimmune disorders, cancers, and she is a nonsmoker.  She lives at home with her husband.  Past Medical History:  Diagnosis Date  . Asthma   . Diabetes mellitus without complication ([place])   . Hypertension     Past Surgical History:  Procedure Laterality Date  . IR GENERIC HISTORICAL  07/30/2016   IR US GUIDE VASC ACCESS RIGHT 07/30/2016 Markus Daft, MD [facility]  . IR GENERIC HISTORICAL  07/30/2016   IR FLUORO GUIDE CV MIDLINE PICC RIGHT 07/30/2016 Markus Daft, MD [facility]  . KNEE ARTHROSCOPY     x 4  . REPLACEMENT TOTAL KNEE      FH:  noncontrib.  Social  History:  reports that she has never smoked. She has never used smokeless tobacco. She reports that she does not drink alcohol or use drugs.  Allergies:  Allergies  Allergen Reactions  . Sulfa Antibiotics Shortness Of Breath and Rash  . Adhesive [Tape] Other (See Comments)    Causes redness, please use paper tape  . Keflex [Cephalexin] Other (See Comments)    Possibly caused lethargy (taken with Flagyl)  . Metronidazole Other (See Comments)    Possibly caused lethargy (taken with keflex)  . Teflaro [Ceftaroline] Other (See Comments)    Lethargy, possibly rash    Medications: I have reviewed the patient's current medications.  Results for orders placed or performed during the hospital encounter of 08/24/16 (from the past 48 hour(s))  Comprehensive metabolic panel     Status: Abnormal   Collection Time: 08/24/16 12:18 PM  Result Value Ref Range   Sodium 142 135 - 145 mmol/L   Potassium 4.6 3.5 - 5.1 mmol/L   Chloride 108 101 - 111 mmol/L   CO2 25 22 - 32 mmol/L   Glucose, Bld 110 (H) 65 - 99 mg/dL   BUN 48 (H) 6 - 20 mg/dL   Creatinine, Ser 3.75 (H) 0.44 - 1.00 mg/dL   Calcium 8.6 (L) 8.9 - 10.3 mg/dL   Total Protein 5.7 (L) 6.5 - 8.1 g/dL   Albumin 2.5 (L) 3.5 - 5.0  g/dL   AST 30 15 - 41 U/L   ALT 23 14 - 54 U/L   Alkaline Phosphatase 65 38 - 126 U/L   Total Bilirubin 0.6 0.3 - 1.2 mg/dL   GFR calc non Af Amer 11 (L) >60 mL/min   GFR calc Af Amer 13 (L) >60 mL/min    Comment: (NOTE) The eGFR has been calculated using the CKD EPI equation. This calculation has not been validated in all clinical situations. eGFR's persistently <60 mL/min signify possible Chronic Kidney Disease.    Anion gap 9 5 - 15  CBC with Differential     Status: Abnormal   Collection Time: 08/24/16 12:18 PM  Result Value Ref Range   WBC 4.7 4.0 - 10.5 K/uL   RBC 3.46 (L) 3.87 - 5.11 MIL/uL   Hemoglobin 9.6 (L) 12.0 - 15.0 g/dL   HCT 31.0 (L) 36.0 - 46.0 %   MCV 89.6 78.0 - 100.0 fL   MCH 27.7  26.0 - 34.0 pg   MCHC 31.0 30.0 - 36.0 g/dL   RDW 14.4 11.5 - 15.5 %   Platelets 180 150 - 400 K/uL   Neutrophils Relative % 46 %   Lymphocytes Relative 21 %   Monocytes Relative 7 %   Eosinophils Relative 25 %   Basophils Relative 1 %   Neutro Abs 2.2 1.7 - 7.7 K/uL   Lymphs Abs 1.0 0.7 - 4.0 K/uL   Monocytes Absolute 0.3 0.1 - 1.0 K/uL   Eosinophils Absolute 1.2 (H) 0.0 - 0.7 K/uL   Basophils Absolute 0.0 0.0 - 0.1 K/uL   Smear Review MORPHOLOGY UNREMARKABLE   Protime-INR     Status: Abnormal   Collection Time: 08/24/16 12:18 PM  Result Value Ref Range   Prothrombin Time 15.8 (H) 11.4 - 15.2 seconds   INR 1.26   I-Stat CG4 Lactic Acid, ED     Status: None   Collection Time: 08/24/16 12:29 PM  Result Value Ref Range   Lactic Acid, Venous 1.44 0.5 - 1.9 mmol/L  Urinalysis, Routine w reflex microscopic     Status: Abnormal   Collection Time: 08/24/16  3:08 PM  Result Value Ref Range   Color, Urine YELLOW YELLOW   APPearance HAZY (A) CLEAR   Specific Gravity, Urine 1.015 1.005 - 1.030   pH 5.0 5.0 - 8.0   Glucose, UA NEGATIVE NEGATIVE mg/dL   Hgb urine dipstick NEGATIVE NEGATIVE   Bilirubin Urine NEGATIVE NEGATIVE   Ketones, ur NEGATIVE NEGATIVE mg/dL   Protein, ur NEGATIVE NEGATIVE mg/dL   Nitrite NEGATIVE NEGATIVE   Leukocytes, UA NEGATIVE NEGATIVE  Creatinine, urine, random     Status: None   Collection Time: 08/24/16  3:08 PM  Result Value Ref Range   Creatinine, Urine 168.68 mg/dL  Sodium, urine, random     Status: None   Collection Time: 08/24/16  3:08 PM  Result Value Ref Range   Sodium, Ur 57 mmol/L  CK     Status: None   Collection Time: 08/24/16  5:02 PM  Result Value Ref Range   Total CK 97 38 - 234 U/L  Magnesium     Status: None   Collection Time: 08/24/16  5:02 PM  Result Value Ref Range   Magnesium 1.8 1.7 - 2.4 mg/dL  Phosphorus     Status: None   Collection Time: 08/24/16  5:02 PM  Result Value Ref Range   Phosphorus 4.4 2.5 - 4.6 mg/dL  CBC  Status: Abnormal   Collection Time: 08/24/16  5:02 PM  Result Value Ref Range   WBC 4.3 4.0 - 10.5 K/uL   RBC 3.43 (L) 3.87 - 5.11 MIL/uL   Hemoglobin 9.3 (L) 12.0 - 15.0 g/dL   HCT 30.5 (L) 36.0 - 46.0 %   MCV 88.9 78.0 - 100.0 fL   MCH 27.1 26.0 - 34.0 pg   MCHC 30.5 30.0 - 36.0 g/dL   RDW 14.1 11.5 - 15.5 %   Platelets 175 150 - 400 K/uL  Creatinine, serum     Status: Abnormal   Collection Time: 08/24/16  5:02 PM  Result Value Ref Range   Creatinine, Ser 3.48 (H) 0.44 - 1.00 mg/dL   GFR calc non Af Amer 12 (L) >60 mL/min   GFR calc Af Amer 14 (L) >60 mL/min    Comment: (NOTE) The eGFR has been calculated using the CKD EPI equation. This calculation has not been validated in all clinical situations. eGFR's persistently <60 mL/min signify possible Chronic Kidney Disease.   I-Stat CG4 Lactic Acid, ED     Status: None   Collection Time: 08/24/16  5:19 PM  Result Value Ref Range   Lactic Acid, Venous 0.91 0.5 - 1.9 mmol/L  Glucose, capillary     Status: Abnormal   Collection Time: 08/24/16  5:58 PM  Result Value Ref Range   Glucose-Capillary 119 (H) 65 - 99 mg/dL  Glucose, capillary     Status: Abnormal   Collection Time: 08/24/16 11:16 PM  Result Value Ref Range   Glucose-Capillary 132 (H) 65 - 99 mg/dL  CBC     Status: Abnormal   Collection Time: 08/25/16  4:30 AM  Result Value Ref Range   WBC 4.2 4.0 - 10.5 K/uL   RBC 3.27 (L) 3.87 - 5.11 MIL/uL   Hemoglobin 9.0 (L) 12.0 - 15.0 g/dL   HCT 29.2 (L) 36.0 - 46.0 %   MCV 89.3 78.0 - 100.0 fL   MCH 27.5 26.0 - 34.0 pg   MCHC 30.8 30.0 - 36.0 g/dL   RDW 14.2 11.5 - 15.5 %   Platelets 110 (L) 150 - 400 K/uL    Comment: PLATELET COUNT CONFIRMED BY SMEAR  Basic metabolic panel     Status: Abnormal   Collection Time: 08/25/16  6:44 AM  Result Value Ref Range   Sodium 143 135 - 145 mmol/L   Potassium 4.9 3.5 - 5.1 mmol/L   Chloride 113 (H) 101 - 111 mmol/L   CO2 23 22 - 32 mmol/L   Glucose, Bld 107 (H) 65 - 99 mg/dL    BUN 37 (H) 6 - 20 mg/dL   Creatinine, Ser 2.68 (H) 0.44 - 1.00 mg/dL   Calcium 8.3 (L) 8.9 - 10.3 mg/dL   GFR calc non Af Amer 16 (L) >60 mL/min   GFR calc Af Amer 19 (L) >60 mL/min    Comment: (NOTE) The eGFR has been calculated using the CKD EPI equation. This calculation has not been validated in all clinical situations. eGFR's persistently <60 mL/min signify possible Chronic Kidney Disease.    Anion gap 7 5 - 15  Glucose, capillary     Status: Abnormal   Collection Time: 08/25/16  6:56 AM  Result Value Ref Range   Glucose-Capillary 100 (H) 65 - 99 mg/dL    Dg Chest 2 View  Result Date: 08/24/2016 CLINICAL DATA:  Fever EXAM: CHEST  2 VIEW COMPARISON:  07/30/2016 FINDINGS: Cardiomediastinal silhouette is stable. Mild streaky  left basilar atelectasis or scarring. No infiltrate or pleural effusion. There is right arm PICC line with tip in SVC. IMPRESSION: No infiltrate or pleural effusion. Mild streaky bilateral basilar atelectasis or scarring. Right arm PICC line with tip in SVC. Electronically Signed   By: Lahoma Crocker M.D.   On: 08/24/2016 14:16   Ct Head Wo Contrast  Result Date: 08/24/2016 CLINICAL DATA:  76 year old female with altered mental status. EXAM: CT HEAD WITHOUT CONTRAST TECHNIQUE: Contiguous axial images were obtained from the base of the skull through the vertex without intravenous contrast. COMPARISON:  None. FINDINGS: Brain: No evidence of acute infarction, hemorrhage, hydrocephalus, extra-axial collection or mass lesion/mass effect. Mild generalized cerebral volume loss noted. Vascular: No hyperdense vessel or unexpected calcification. Skull: Normal. Negative for fracture or focal lesion. Sinuses/Orbits: No acute finding. Other: None. IMPRESSION: No evidence of acute intracranial abnormality. Electronically Signed   By: Margarette Canada M.D.   On: 08/24/2016 14:22   Dg Toe Great Left  Result Date: 08/24/2016 CLINICAL DATA:  Open wound of the proximal great toe. EXAM: LEFT  GREAT TOE COMPARISON:  06/12/2016 radiographs and 07/02/2016 MR FINDINGS: Fragmentation and osteolysis of the first metatarsal head is compatible with osteomyelitis. There is apparent cortical loss at the base of the great toe proximal phalanx worrisome for acute osteomyelitis as well. No soft tissue gas is identified. IMPRESSION: Osteomyelitis of the first metatarsal head and base of the great toe proximal phalanx. Electronically Signed   By: Margarette Canada M.D.   On: 08/24/2016 14:18    ROS:   Gen: Patient admits to fatigue.  Denies fever, chills, weight change, night sweats MSK: Patient notes that her left hallux is in a cocked up position and that she walks on the side of her left foot. Neuro: Admits to numbness due to peripheral neuropathy.  Denies headache, slurred speech, loss of memory or consciousness Derm: Nonhealing ulcer that persistently drains at the medial aspect of her proximal great toe. HEENT: Denies blurred vision, double vision, neck stiffness, dysphagia PULM: Denies shortness of breath, cough, sputum production, hemoptysis, wheezing CV: Denies chest pain, edema, orthopnea, palpitations GI: Denies abdominal pain, nausea, vomiting, diarrhea, hematochezia, melena, constipation  Endocrine: Denies hot or cold intolerance, polyuria, polyphagia or appetite change Heme: Denies easy bruising, bleeding, bleeding gums  PE:  Blood pressure (!) 102/48, pulse 71, temperature 98.1 F (36.7 C), temperature source Oral, resp. rate 18, height 5' (1.524 m), weight 75.3 kg (166 lb), SpO2 99 %. General: WDWN patient in NAD. Psych:  Appropriate mood and affect. Neuro:  A&O x 3, Moving all extremities, sensation intact to light touch HEENT:  EOMs intact Chest:  Even non-labored respirations Skin:  Dressing was removed.  A 3 cm x 3 cm L hallux ulcer at the medial hypertrophic eminence.  There is hypertrophic granulation tissue, serosanguinous drainage, and I am able to probe to bone through the  ulcer.  A new dry dressing was applied. Extremities: warm/dry, mild edema and erythema throughout L LE.  No echymosis.  No lymphadenopathy. Pulses: Dorsalis pedis and post tibialis 1+ MSK:  ROM: patient can actively DF/PF lesser toes.  Hallux remains fixed in cocked up position, MMT: patient is able to perform quad set, (-) Homan's   Assessment/Plan: Nonhealing diabetic ulcer stage IV of L hallux Osteomyelitis of L proximal hallux and 1st metatarsal.  I explained to the patient that once infection gets to the bone that it will not heal with antibiotics.  After discussing the risks of  bleeding, infection, nerve damage, difficulty with tissue healing, need for additional surgery including further amputation, and death the patient has agreed to under to a L 1st ray amputation by Dr. Wylene Simmer.    The patient is to remain NPO Plan for L 1st ray amputation tomorrow 08/26/16. WBAT L LE. Dressing changes as needed. Continue ABX per medicine team.  Mechele Claude, PA-C [place] Office:  [phone]  Pt seen and examined.  Agree with findings documented in note above.  A:  Chronic left plantar forefoot diabetic ulcer with osteomyelitis of the hallux and 1st MT. P:  To OR for L 1st ray amputation.  I explained the nature of the infection to the patient and her husband in detail.  She understands the risks and benefits of the treatment options and elects surgical treatment.  NPO after midnight.  OR tomorrow for left ray amputation.                                                                                           The risks and benefits of the alternative treatment options have been discussed in detail.  The patient wishes to proceed with surgery and specifically understands risks of bleeding, infection, nerve damage, blood clots, need for additional surgery, amputation and death.

## 2016-08-25 NOTE — Consult Note (Signed)
[facility]  Date of Admission:  08/24/2016  Date of Consult:  08/25/2016  Reason for Consult: L foot osteomyelitis/diabetic foot    Adverse drug reaction Referring Physician: Allyson Sabal  Impression/Recommendation L foot osteomyellitis  Will cover her broadly til surgery (zyvox/levaquin).  Aim for 10 days of po post surgery No Cx in notes or hospital lab.  My great appreciation to Dr Doran Durand and PA Alfredo Martinez  DM with neuropathy FSG well controlled in hospital.   ADR Appears to have developed rash to teflaro.  Added to allergy list.  Change her anbx through surgery.    CKD Worsening, could consider renal eval Thank you so much for this interesting consult,   Bobby Rumpf (pager) [phone] [email or web address]  Angela Castro is an 76 y.o. female.  HPI: 76 yo F with hx of DM since (~ 2013). She has been seen in ID on 3-20 with a L 1st MT ulcer that began to worsen in January. She was seen by wound center and has been on several po anbx (keflex, augmentin, flagyl, doxy).  At ID she was started on teflaro around 3-20. She was f/u in ID on 4-10 and appeared to be doing well.  She came to ED on advice of her ID MD on 4-15 after she became weak and had a rash.  Her WBC was 4.7 She was eval by ortho today and is to undergo 1st ray amputation on 4-17.   Past Medical History:  Diagnosis Date  . Asthma   . Diabetes mellitus without complication ([place])   . Hypertension     Past Surgical History:  Procedure Laterality Date  . IR GENERIC HISTORICAL  07/30/2016   IR US GUIDE VASC ACCESS RIGHT 07/30/2016 Markus Daft, MD [facility]  . IR GENERIC HISTORICAL  07/30/2016   IR FLUORO GUIDE CV MIDLINE PICC RIGHT 07/30/2016 Markus Daft, MD [facility]  . KNEE ARTHROSCOPY     x 4  . REPLACEMENT TOTAL KNEE       Allergies  Allergen Reactions  . Sulfa Antibiotics Shortness Of Breath and Rash  . Adhesive [Tape] Other (See Comments)    Causes redness,  please use paper tape  . Keflex [Cephalexin] Other (See Comments)    Possibly caused lethargy (taken with Flagyl)  . Metronidazole Other (See Comments)    Possibly caused lethargy (taken with keflex)  . Teflaro [Ceftaroline] Other (See Comments)    Lethargy, possibly rash    Medications:  Scheduled: . allopurinol  100 mg Oral BID  . aspirin EC  81 mg Oral Daily  . hydrocortisone   Rectal TID  . insulin aspart  0-9 Units Subcutaneous TID WC  . saccharomyces boulardii  250 mg Oral BID  . sodium chloride flush  3 mL Intravenous Q12H  . vitamin C  500 mg Oral Daily    Abtx:  Anti-infectives    Start     Dose/Rate Route Frequency Ordered Stop   08/24/16 1700  DAPTOmycin (CUBICIN) 600 mg in sodium chloride 0.9 % IVPB  Status:  Discontinued     600 mg 224 mL/hr over 30 Minutes Intravenous Every 24 hours 08/24/16 1554 08/24/16 1613   08/24/16 1615  DAPTOmycin (CUBICIN) 600 mg in sodium chloride 0.9 % IVPB  Status:  Discontinued     600 mg 224 mL/hr over 30 Minutes Intravenous Every 48 hours 08/24/16 1613 08/25/16 1250   08/24/16 1515  vancomycin (VANCOCIN) 1,500 mg in sodium chloride 0.9 % 500  mL IVPB     1,500 mg 250 mL/hr over 120 Minutes Intravenous  Once 08/24/16 1503 08/24/16 1754      Total days of antibiotics: off          Social History:  reports that she has never smoked. She has never used smokeless tobacco. She reports that she does not drink alcohol or use drugs.  No family history on file.  General ROS: anorexia, diffuse edema, normal urine, loose Bm for ~ 5 days, +neuropathy, see HPI Please see HPI. 12 point ROS o/w (-)  Blood pressure (!) 116/44, pulse 75, temperature 97.9 F (36.6 C), temperature source Oral, resp. rate 20, height 5' (1.524 m), weight 75.3 kg (166 lb), SpO2 96 %. General appearance: alert, cooperative and no distress Eyes: negative findings: conjunctivae and sclerae normal and pupils equal, round, reactive to light and accomodation Throat:  normal findings: oropharynx pink & moist without lesions or evidence of thrush Neck: no adenopathy and supple, symmetrical, trachea midline Lungs: clear to auscultation bilaterally Heart: regular rate and rhythm Abdomen: normal findings: bowel sounds normal and soft, non-tender Extremities: edema 2+ LLE and quarter sized wound on medial L MT head. bloody d/c expressable. non-tender. decreased light touch in BLE.    Results for orders placed or performed during the hospital encounter of 08/24/16 (from the past 48 hour(s))  Comprehensive metabolic panel     Status: Abnormal   Collection Time: 08/24/16 12:18 PM  Result Value Ref Range   Sodium 142 135 - 145 mmol/L   Potassium 4.6 3.5 - 5.1 mmol/L   Chloride 108 101 - 111 mmol/L   CO2 25 22 - 32 mmol/L   Glucose, Bld 110 (H) 65 - 99 mg/dL   BUN 48 (H) 6 - 20 mg/dL   Creatinine, Ser 3.75 (H) 0.44 - 1.00 mg/dL   Calcium 8.6 (L) 8.9 - 10.3 mg/dL   Total Protein 5.7 (L) 6.5 - 8.1 g/dL   Albumin 2.5 (L) 3.5 - 5.0 g/dL   AST 30 15 - 41 U/L   ALT 23 14 - 54 U/L   Alkaline Phosphatase 65 38 - 126 U/L   Total Bilirubin 0.6 0.3 - 1.2 mg/dL   GFR calc non Af Amer 11 (L) >60 mL/min   GFR calc Af Amer 13 (L) >60 mL/min    Comment: (NOTE) The eGFR has been calculated using the CKD EPI equation. This calculation has not been validated in all clinical situations. eGFR's persistently <60 mL/min signify possible Chronic Kidney Disease.    Anion gap 9 5 - 15  CBC with Differential     Status: Abnormal   Collection Time: 08/24/16 12:18 PM  Result Value Ref Range   WBC 4.7 4.0 - 10.5 K/uL   RBC 3.46 (L) 3.87 - 5.11 MIL/uL   Hemoglobin 9.6 (L) 12.0 - 15.0 g/dL   HCT 31.0 (L) 36.0 - 46.0 %   MCV 89.6 78.0 - 100.0 fL   MCH 27.7 26.0 - 34.0 pg   MCHC 31.0 30.0 - 36.0 g/dL   RDW 14.4 11.5 - 15.5 %   Platelets 180 150 - 400 K/uL   Neutrophils Relative % 46 %   Lymphocytes Relative 21 %   Monocytes Relative 7 %   Eosinophils Relative 25 %    Basophils Relative 1 %   Neutro Abs 2.2 1.7 - 7.7 K/uL   Lymphs Abs 1.0 0.7 - 4.0 K/uL   Monocytes Absolute 0.3 0.1 - 1.0 K/uL   Eosinophils  Absolute 1.2 (H) 0.0 - 0.7 K/uL   Basophils Absolute 0.0 0.0 - 0.1 K/uL   Smear Review MORPHOLOGY UNREMARKABLE   Protime-INR     Status: Abnormal   Collection Time: 08/24/16 12:18 PM  Result Value Ref Range   Prothrombin Time 15.8 (H) 11.4 - 15.2 seconds   INR 1.26   Culture, blood (Routine x 2)     Status: None (Preliminary result)   Collection Time: 08/24/16 12:20 PM  Result Value Ref Range   Specimen Description BLOOD LEFT ANTECUBITAL    Special Requests      BOTTLES DRAWN AEROBIC AND ANAEROBIC Blood Culture adequate volume   Culture NO GROWTH 1 DAY    Report Status PENDING   I-Stat CG4 Lactic Acid, ED     Status: None   Collection Time: 08/24/16 12:29 PM  Result Value Ref Range   Lactic Acid, Venous 1.44 0.5 - 1.9 mmol/L  Urinalysis, Routine w reflex microscopic     Status: Abnormal   Collection Time: 08/24/16  3:08 PM  Result Value Ref Range   Color, Urine YELLOW YELLOW   APPearance HAZY (A) CLEAR   Specific Gravity, Urine 1.015 1.005 - 1.030   pH 5.0 5.0 - 8.0   Glucose, UA NEGATIVE NEGATIVE mg/dL   Hgb urine dipstick NEGATIVE NEGATIVE   Bilirubin Urine NEGATIVE NEGATIVE   Ketones, ur NEGATIVE NEGATIVE mg/dL   Protein, ur NEGATIVE NEGATIVE mg/dL   Nitrite NEGATIVE NEGATIVE   Leukocytes, UA NEGATIVE NEGATIVE  Creatinine, urine, random     Status: None   Collection Time: 08/24/16  3:08 PM  Result Value Ref Range   Creatinine, Urine 168.68 mg/dL  Sodium, urine, random     Status: None   Collection Time: 08/24/16  3:08 PM  Result Value Ref Range   Sodium, Ur 57 mmol/L  CK     Status: None   Collection Time: 08/24/16  5:02 PM  Result Value Ref Range   Total CK 97 38 - 234 U/L  Magnesium     Status: None   Collection Time: 08/24/16  5:02 PM  Result Value Ref Range   Magnesium 1.8 1.7 - 2.4 mg/dL  Phosphorus     Status: None     Collection Time: 08/24/16  5:02 PM  Result Value Ref Range   Phosphorus 4.4 2.5 - 4.6 mg/dL  CBC     Status: Abnormal   Collection Time: 08/24/16  5:02 PM  Result Value Ref Range   WBC 4.3 4.0 - 10.5 K/uL   RBC 3.43 (L) 3.87 - 5.11 MIL/uL   Hemoglobin 9.3 (L) 12.0 - 15.0 g/dL   HCT 30.5 (L) 36.0 - 46.0 %   MCV 88.9 78.0 - 100.0 fL   MCH 27.1 26.0 - 34.0 pg   MCHC 30.5 30.0 - 36.0 g/dL   RDW 14.1 11.5 - 15.5 %   Platelets 175 150 - 400 K/uL  Creatinine, serum     Status: Abnormal   Collection Time: 08/24/16  5:02 PM  Result Value Ref Range   Creatinine, Ser 3.48 (H) 0.44 - 1.00 mg/dL   GFR calc non Af Amer 12 (L) >60 mL/min   GFR calc Af Amer 14 (L) >60 mL/min    Comment: (NOTE) The eGFR has been calculated using the CKD EPI equation. This calculation has not been validated in all clinical situations. eGFR's persistently <60 mL/min signify possible Chronic Kidney Disease.   I-Stat CG4 Lactic Acid, ED     Status: None  Collection Time: 08/24/16  5:19 PM  Result Value Ref Range   Lactic Acid, Venous 0.91 0.5 - 1.9 mmol/L  Glucose, capillary     Status: Abnormal   Collection Time: 08/24/16  5:58 PM  Result Value Ref Range   Glucose-Capillary 119 (H) 65 - 99 mg/dL  Glucose, capillary     Status: Abnormal   Collection Time: 08/24/16 11:16 PM  Result Value Ref Range   Glucose-Capillary 132 (H) 65 - 99 mg/dL  CBC     Status: Abnormal   Collection Time: 08/25/16  4:30 AM  Result Value Ref Range   WBC 4.2 4.0 - 10.5 K/uL   RBC 3.27 (L) 3.87 - 5.11 MIL/uL   Hemoglobin 9.0 (L) 12.0 - 15.0 g/dL   HCT 29.2 (L) 36.0 - 46.0 %   MCV 89.3 78.0 - 100.0 fL   MCH 27.5 26.0 - 34.0 pg   MCHC 30.8 30.0 - 36.0 g/dL   RDW 14.2 11.5 - 15.5 %   Platelets 110 (L) 150 - 400 K/uL    Comment: PLATELET COUNT CONFIRMED BY SMEAR  Basic metabolic panel     Status: Abnormal   Collection Time: 08/25/16  6:44 AM  Result Value Ref Range   Sodium 143 135 - 145 mmol/L   Potassium 4.9 3.5 - 5.1  mmol/L   Chloride 113 (H) 101 - 111 mmol/L   CO2 23 22 - 32 mmol/L   Glucose, Bld 107 (H) 65 - 99 mg/dL   BUN 37 (H) 6 - 20 mg/dL   Creatinine, Ser 2.68 (H) 0.44 - 1.00 mg/dL   Calcium 8.3 (L) 8.9 - 10.3 mg/dL   GFR calc non Af Amer 16 (L) >60 mL/min   GFR calc Af Amer 19 (L) >60 mL/min    Comment: (NOTE) The eGFR has been calculated using the CKD EPI equation. This calculation has not been validated in all clinical situations. eGFR's persistently <60 mL/min signify possible Chronic Kidney Disease.    Anion gap 7 5 - 15  Glucose, capillary     Status: Abnormal   Collection Time: 08/25/16  6:56 AM  Result Value Ref Range   Glucose-Capillary 100 (H) 65 - 99 mg/dL      Component Value Date/Time   SDES BLOOD LEFT ANTECUBITAL 08/24/2016 1220   SPECREQUEST  08/24/2016 1220    BOTTLES DRAWN AEROBIC AND ANAEROBIC Blood Culture adequate volume   CULT NO GROWTH 1 DAY 08/24/2016 1220   REPTSTATUS PENDING 08/24/2016 1220   Dg Chest 2 View  Result Date: 08/24/2016 CLINICAL DATA:  Fever EXAM: CHEST  2 VIEW COMPARISON:  07/30/2016 FINDINGS: Cardiomediastinal silhouette is stable. Mild streaky left basilar atelectasis or scarring. No infiltrate or pleural effusion. There is right arm PICC line with tip in SVC. IMPRESSION: No infiltrate or pleural effusion. Mild streaky bilateral basilar atelectasis or scarring. Right arm PICC line with tip in SVC. Electronically Signed   By: Lahoma Crocker M.D.   On: 08/24/2016 14:16   Ct Head Wo Contrast  Result Date: 08/24/2016 CLINICAL DATA:  76 year old female with altered mental status. EXAM: CT HEAD WITHOUT CONTRAST TECHNIQUE: Contiguous axial images were obtained from the base of the skull through the vertex without intravenous contrast. COMPARISON:  None. FINDINGS: Brain: No evidence of acute infarction, hemorrhage, hydrocephalus, extra-axial collection or mass lesion/mass effect. Mild generalized cerebral volume loss noted. Vascular: No hyperdense vessel or  unexpected calcification. Skull: Normal. Negative for fracture or focal lesion. Sinuses/Orbits: No acute finding. Other: None. IMPRESSION:  No evidence of acute intracranial abnormality. Electronically Signed   By: Margarette Canada M.D.   On: 08/24/2016 14:22   Dg Toe Great Left  Result Date: 08/24/2016 CLINICAL DATA:  Open wound of the proximal great toe. EXAM: LEFT GREAT TOE COMPARISON:  06/12/2016 radiographs and 07/02/2016 MR FINDINGS: Fragmentation and osteolysis of the first metatarsal head is compatible with osteomyelitis. There is apparent cortical loss at the base of the great toe proximal phalanx worrisome for acute osteomyelitis as well. No soft tissue gas is identified. IMPRESSION: Osteomyelitis of the first metatarsal head and base of the great toe proximal phalanx. Electronically Signed   By: Margarette Canada M.D.   On: 08/24/2016 14:18   Recent Results (from the past 240 hour(s))  Culture, blood (Routine x 2)     Status: None (Preliminary result)   Collection Time: 08/24/16 12:20 PM  Result Value Ref Range Status   Specimen Description BLOOD LEFT ANTECUBITAL  Final   Special Requests   Final    BOTTLES DRAWN AEROBIC AND ANAEROBIC Blood Culture adequate volume   Culture NO GROWTH 1 DAY  Final   Report Status PENDING  Incomplete      08/25/2016, 3:54 PM     LOS: 1 day    Records and images were personally reviewed where available.

## 2016-08-26 ENCOUNTER — Inpatient Hospital Stay (HOSPITAL_COMMUNITY): Payer: Medicare Other | Admitting: Certified Registered"

## 2016-08-26 ENCOUNTER — Inpatient Hospital Stay (HOSPITAL_COMMUNITY): Payer: Medicare Other

## 2016-08-26 ENCOUNTER — Encounter (HOSPITAL_COMMUNITY): Payer: Self-pay | Admitting: Certified Registered"

## 2016-08-26 ENCOUNTER — Encounter (HOSPITAL_COMMUNITY)
Admission: EM | Disposition: A | Discharge status: Home Health | Payer: Self-pay | Source: Home / Self Care | Attending: Internal Medicine

## 2016-08-26 DIAGNOSIS — E11621 Type 2 diabetes mellitus with foot ulcer: Secondary | ICD-10-CM

## 2016-08-26 DIAGNOSIS — N183 Chronic kidney disease, stage 3 (moderate): Secondary | ICD-10-CM

## 2016-08-26 HISTORY — PX: AMPUTATION: SHX166

## 2016-08-26 LAB — CBC
HEMATOCRIT: 27.2 % — AB (ref 36.0–46.0)
Hemoglobin: 8.5 g/dL — ABNORMAL LOW (ref 12.0–15.0)
MCH: 27.2 pg (ref 26.0–34.0)
MCHC: 31.3 g/dL (ref 30.0–36.0)
MCV: 86.9 fL (ref 78.0–100.0)
PLATELETS: 198 10*3/uL (ref 150–400)
RBC: 3.13 MIL/uL — ABNORMAL LOW (ref 3.87–5.11)
RDW: 14.2 % (ref 11.5–15.5)
WBC: 4.4 10*3/uL (ref 4.0–10.5)

## 2016-08-26 LAB — GLUCOSE, CAPILLARY
GLUCOSE-CAPILLARY: 94 mg/dL (ref 65–99)
Glucose-Capillary: 102 mg/dL — ABNORMAL HIGH (ref 65–99)
Glucose-Capillary: 109 mg/dL — ABNORMAL HIGH (ref 65–99)
Glucose-Capillary: 97 mg/dL (ref 65–99)

## 2016-08-26 LAB — COMPREHENSIVE METABOLIC PANEL
ALBUMIN: 2.2 g/dL — AB (ref 3.5–5.0)
ALT: 19 U/L (ref 14–54)
ANION GAP: 8 (ref 5–15)
AST: 20 U/L (ref 15–41)
Alkaline Phosphatase: 56 U/L (ref 38–126)
BUN: 25 mg/dL — AB (ref 6–20)
CHLORIDE: 117 mmol/L — AB (ref 101–111)
CO2: 21 mmol/L — AB (ref 22–32)
Calcium: 8.5 mg/dL — ABNORMAL LOW (ref 8.9–10.3)
Creatinine, Ser: 1.95 mg/dL — ABNORMAL HIGH (ref 0.44–1.00)
GFR calc Af Amer: 28 mL/min — ABNORMAL LOW (ref >60)
GFR calc non Af Amer: 24 mL/min — ABNORMAL LOW (ref >60)
GLUCOSE: 108 mg/dL — AB (ref 65–99)
Potassium: 4.3 mmol/L (ref 3.5–5.1)
SODIUM: 146 mmol/L — AB (ref 135–145)
TOTAL PROTEIN: 5.2 g/dL — AB (ref 6.5–8.1)
Total Bilirubin: 0.6 mg/dL (ref 0.3–1.2)

## 2016-08-26 LAB — HEMOGLOBIN A1C
Hgb A1c MFr Bld: 7.2 % — ABNORMAL HIGH (ref 4.8–5.6)
MEAN PLASMA GLUCOSE: 160 mg/dL

## 2016-08-26 SURGERY — AMPUTATION, FOOT, RAY
Anesthesia: General | Laterality: Left

## 2016-08-26 MED ORDER — CALCIUM CARBONATE-VITAMIN D 500-200 MG-UNIT PO TABS
1.0000 | ORAL_TABLET | Freq: Every day | ORAL | Status: DC
  Administered 2016-08-26 – 2016-08-28 (×3): 1 via ORAL
  Filled 2016-08-26 (×3): qty 1

## 2016-08-26 MED ORDER — PHENYLEPHRINE HCL 10 MG/ML IJ SOLN
INTRAMUSCULAR | Status: DC | PRN
  Administered 2016-08-26: 80 ug via INTRAVENOUS

## 2016-08-26 MED ORDER — LIDOCAINE HCL (CARDIAC) 20 MG/ML IV SOLN
INTRAVENOUS | Status: DC | PRN
  Administered 2016-08-26: 40 mg via INTRAVENOUS

## 2016-08-26 MED ORDER — LOPERAMIDE HCL 2 MG PO CAPS
2.0000 mg | ORAL_CAPSULE | Freq: Once | ORAL | Status: AC
  Administered 2016-08-26: 2 mg via ORAL
  Filled 2016-08-26: qty 1

## 2016-08-26 MED ORDER — FENTANYL CITRATE (PF) 250 MCG/5ML IJ SOLN
INTRAMUSCULAR | Status: AC
  Filled 2016-08-26: qty 5

## 2016-08-26 MED ORDER — FENTANYL CITRATE (PF) 100 MCG/2ML IJ SOLN
INTRAMUSCULAR | Status: DC | PRN
  Administered 2016-08-26: 100 ug via INTRAVENOUS

## 2016-08-26 MED ORDER — PROPOFOL 10 MG/ML IV BOLUS
INTRAVENOUS | Status: AC
  Filled 2016-08-26: qty 20

## 2016-08-26 MED ORDER — PROPOFOL 10 MG/ML IV BOLUS
INTRAVENOUS | Status: DC | PRN
  Administered 2016-08-26: 150 mg via INTRAVENOUS

## 2016-08-26 MED ORDER — SODIUM CHLORIDE 0.45 % IV SOLN
INTRAVENOUS | Status: DC
  Administered 2016-08-26 (×2): via INTRAVENOUS

## 2016-08-26 MED ORDER — ONDANSETRON HCL 4 MG/2ML IJ SOLN
INTRAMUSCULAR | Status: DC | PRN
  Administered 2016-08-26: 4 mg via INTRAVENOUS

## 2016-08-26 MED ORDER — 0.9 % SODIUM CHLORIDE (POUR BTL) OPTIME
TOPICAL | Status: DC | PRN
  Administered 2016-08-26: 1000 mL

## 2016-08-26 MED ORDER — MIDAZOLAM HCL 2 MG/2ML IJ SOLN
INTRAMUSCULAR | Status: AC
  Filled 2016-08-26: qty 2

## 2016-08-26 MED ORDER — SODIUM CHLORIDE 0.9% FLUSH
10.0000 mL | INTRAVENOUS | Status: DC | PRN
  Administered 2016-08-27: 10 mL
  Filled 2016-08-26: qty 40

## 2016-08-26 MED ORDER — OXYCODONE-ACETAMINOPHEN 5-325 MG PO TABS
1.0000 | ORAL_TABLET | ORAL | Status: DC | PRN
  Administered 2016-08-26 – 2016-08-27 (×4): 2 via ORAL
  Administered 2016-08-28: 1 via ORAL
  Filled 2016-08-26: qty 2
  Filled 2016-08-26: qty 1
  Filled 2016-08-26 (×3): qty 2

## 2016-08-26 MED ORDER — CALCIUM-D 600-400 MG-UNIT PO TABS: 1.0000 | ORAL_TABLET | Freq: Every day | ORAL | Status: DC

## 2016-08-26 MED ORDER — KCL IN DEXTROSE-NACL 20-5-0.45 MEQ/L-%-% IV SOLN
INTRAVENOUS | Status: AC
  Administered 2016-08-26: 1000 mL
  Filled 2016-08-26: qty 1000

## 2016-08-26 MED ORDER — LACTATED RINGERS IV SOLN
INTRAVENOUS | Status: DC
  Administered 2016-08-26: 12:00:00 via INTRAVENOUS

## 2016-08-26 MED ORDER — LACTATED RINGERS IV SOLN
INTRAVENOUS | Status: DC | PRN
  Administered 2016-08-26: 13:00:00 via INTRAVENOUS

## 2016-08-26 SURGICAL SUPPLY — 43 items
BLADE LONG MED 31X9 (MISCELLANEOUS) ×2 IMPLANT
BNDG COHESIVE 4X5 TAN STRL (GAUZE/BANDAGES/DRESSINGS) ×2 IMPLANT
BNDG COHESIVE 6X5 TAN STRL LF (GAUZE/BANDAGES/DRESSINGS) IMPLANT
BNDG ESMARK 4X9 LF (GAUZE/BANDAGES/DRESSINGS) ×2 IMPLANT
BNDG GAUZE ELAST 4 BULKY (GAUZE/BANDAGES/DRESSINGS) ×2 IMPLANT
CANISTER SUCT 3000ML PPV (MISCELLANEOUS) ×2 IMPLANT
CHLORAPREP W/TINT 26ML (MISCELLANEOUS) ×2 IMPLANT
CUFF TOURNIQUET SINGLE 34IN LL (TOURNIQUET CUFF) IMPLANT
CUFF TOURNIQUET SINGLE 44IN (TOURNIQUET CUFF) IMPLANT
DRAPE U-SHAPE 47X51 STRL (DRAPES) ×4 IMPLANT
DRSG MEPITEL 4X7.2 (GAUZE/BANDAGES/DRESSINGS) ×2 IMPLANT
ELECT REM PT RETURN 9FT ADLT (ELECTROSURGICAL) ×2
ELECTRODE REM PT RTRN 9FT ADLT (ELECTROSURGICAL) ×1 IMPLANT
GAUZE SPONGE 4X4 12PLY STRL (GAUZE/BANDAGES/DRESSINGS) ×2 IMPLANT
GAUZE SPONGE 4X4 16PLY XRAY LF (GAUZE/BANDAGES/DRESSINGS) ×2 IMPLANT
GLOVE BIO SURGEON STRL SZ8 (GLOVE) ×4 IMPLANT
GLOVE BIOGEL PI IND STRL 8 (GLOVE) ×2 IMPLANT
GLOVE BIOGEL PI INDICATOR 8 (GLOVE) ×2
GLOVE ECLIPSE 8.0 STRL XLNG CF (GLOVE) ×2 IMPLANT
GOWN STRL REUS W/ TWL LRG LVL3 (GOWN DISPOSABLE) ×1 IMPLANT
GOWN STRL REUS W/ TWL XL LVL3 (GOWN DISPOSABLE) ×2 IMPLANT
GOWN STRL REUS W/TWL LRG LVL3 (GOWN DISPOSABLE) ×1
GOWN STRL REUS W/TWL XL LVL3 (GOWN DISPOSABLE) ×2
KIT BASIN OR (CUSTOM PROCEDURE TRAY) ×2 IMPLANT
KIT ROOM TURNOVER OR (KITS) ×2 IMPLANT
NS IRRIG 1000ML POUR BTL (IV SOLUTION) ×2 IMPLANT
PACK ORTHO EXTREMITY (CUSTOM PROCEDURE TRAY) ×2 IMPLANT
PAD ARMBOARD 7.5X6 YLW CONV (MISCELLANEOUS) ×2 IMPLANT
PAD CAST 4YDX4 CTTN HI CHSV (CAST SUPPLIES) ×1 IMPLANT
PADDING CAST ABS 4INX4YD NS (CAST SUPPLIES) ×1
PADDING CAST ABS COTTON 4X4 ST (CAST SUPPLIES) ×1 IMPLANT
PADDING CAST COTTON 4X4 STRL (CAST SUPPLIES) ×1
SPECIMEN JAR SMALL (MISCELLANEOUS) IMPLANT
SPONGE LAP 18X18 X RAY DECT (DISPOSABLE) IMPLANT
STAPLER VISISTAT 35W (STAPLE) IMPLANT
STOCKINETTE IMPERVIOUS LG (DRAPES) ×2 IMPLANT
SUCTION FRAZIER HANDLE 10FR (MISCELLANEOUS) ×1
SUCTION TUBE FRAZIER 10FR DISP (MISCELLANEOUS) ×1 IMPLANT
SUT ETHILON 2 0 PSLX (SUTURE) ×2 IMPLANT
TOWEL OR 17X26 10 PK STRL BLUE (TOWEL DISPOSABLE) ×2 IMPLANT
TUBE CONNECTING 12X1/4 (SUCTIONS) IMPLANT
UNDERPAD 30X30 (UNDERPADS AND DIAPERS) ×2 IMPLANT
WATER STERILE IRR 1000ML POUR (IV SOLUTION) IMPLANT

## 2016-08-26 NOTE — Interval H&P Note (Signed)
History and Physical Interval Note:  08/26/2016 12:52 PM  Angela Castro  has presented today for surgery, with the diagnosis of LEFT HALLUX OSTEOMYELITIS  The various methods of treatment have been discussed with the patient and family. After consideration of risks, benefits and other options for treatment, the patient has consented to  Procedure(s): AMPUTATION RAY/1ST (Left) as a surgical intervention .  The patient's history has been reviewed, patient examined, no change in status, stable for surgery.  I have reviewed the patient's chart and labs.  Questions were answered to the patient's satisfaction.    The risks and benefits of the alternative treatment options have been discussed in detail.  The patient wishes to proceed with surgery and specifically understands risks of bleeding, infection, nerve damage, blood clots, need for additional surgery, amputation and death.    Wylene Simmer

## 2016-08-26 NOTE — Anesthesia Preprocedure Evaluation (Addendum)
Anesthesia Evaluation  Patient identified by MRN, date of birth, ID band Patient awake    Reviewed: Allergy & Precautions, NPO status , Patient's Chart, lab work & pertinent test results  Airway Mallampati: II  TM Distance: >3 FB Neck ROM: Full    Dental  (+) Teeth Intact, Dental Advisory Given   Pulmonary    breath sounds clear to auscultation       Cardiovascular hypertension,  Rhythm:Regular Rate:Normal     Neuro/Psych    GI/Hepatic   Endo/Other  diabetes  Renal/GU      Musculoskeletal   Abdominal (+) + obese,   Peds  Hematology   Anesthesia Other Findings   Reproductive/Obstetrics                             Anesthesia Physical Anesthesia Plan  ASA: III  Anesthesia Plan: General   Post-op Pain Management:    Induction: Intravenous  Airway Management Planned: LMA  Additional Equipment:   Intra-op Plan:   Post-operative Plan:   Informed Consent: I have reviewed the patients History and Physical, chart, labs and discussed the procedure including the risks, benefits and alternatives for the proposed anesthesia with the patient or authorized representative who has indicated his/her understanding and acceptance.   Dental advisory given  Plan Discussed with: CRNA and Anesthesiologist  Anesthesia Plan Comments:         Anesthesia Quick Evaluation

## 2016-08-26 NOTE — Progress Notes (Signed)
[facility] PROGRESS NOTE  Angela Castro NTZ:[id number] DOB: May 14, 1940 DOA: 08/24/2016   PCP: Pcp Not In System     Assessment/Plan: Principal Problem:   Osteomyelitis ([place]) Active Problems:   Weakness   Diabetes mellitus type 2 in obese [facility])   Acute-on-chronic renal failure ([place])    76 year old female with a history of hypertension, diabetes, nonhealing left foot ulcer, followed by infectious disease, status post receiving teflaro.Patient had been on Teflaro since 3/20 for wound treatment. Patient subsequently developed a rash secondary to Teflaro and so this has been D/C'd and added to her allergy list. She presented 4/15 with fever and wound infection.. Infectious disease and orthopedics consulted for further management  Assessment and plan Osteomyelitis L foot,hallux and 1st MT Discussed with orthopedics Dr.Hewitt, patient needs 1st ray amputation After discontinuation of teflaro, patient was placed on daptomycin. Patient developed diarrhea yesterday and this was discontinued Infectious disease Dr. Johnnye Sima was consulted-started on Levaquin/Zyvox Blood culture 2 no growth so far ABI pending   Generalized weakness Likely secondary to #1 PT consult in AM Hold gabapentin, claritin, ultram  Diabetes Sliding scale insulin Hold LA insulin & Tradjenta Hemoglobin A1c 7.2  Hypertension When necessary hydralazine 10 mg IV as needed for severe blood pressure Hold zebeta, cozaar due to low BP  AKI, in the setting of chronic kidney disease stage III Baseline Cr 2.24, Cr on admit 3.75, now 2.68>1.95  after IV fluids Status post aggressive IV fluid hydration Now switched over to half normal saline due to sodium of 146  Diarrhea-could be secondary to daptomycin C. difficile negative GI panel pending Prn imodium    Asthma Prn albuterol  Gout Cont allopurinol  Rash, secondary to teflaro, improving Hold steroid cream  Hyperlipidemia Hold  zocor     DVT prophylaxsis Lovenox on hold as per orthopedics  Code Status:  Full code    Family Communication: Discussed in detail with the patient, all imaging results, lab results explained to the patient   Disposition Plan:  Surgery 4/17     Consultants:  Orthopedic consultation  Procedures:  L 1st ray amputation 4/17  Antibiotics: Anti-infectives    Start     Dose/Rate Route Frequency Ordered Stop   08/25/16 1800  levofloxacin (LEVAQUIN) IVPB 500 mg     500 mg 100 mL/hr over 60 Minutes Intravenous Every 48 hours 08/25/16 1623     08/25/16 1800  linezolid (ZYVOX) IVPB 600 mg     600 mg 300 mL/hr over 60 Minutes Intravenous Every 12 hours 08/25/16 1623     08/24/16 1700  DAPTOmycin (CUBICIN) 600 mg in sodium chloride 0.9 % IVPB  Status:  Discontinued     600 mg 224 mL/hr over 30 Minutes Intravenous Every 24 hours 08/24/16 1554 08/24/16 1613   08/24/16 1615  DAPTOmycin (CUBICIN) 600 mg in sodium chloride 0.9 % IVPB  Status:  Discontinued     600 mg 224 mL/hr over 30 Minutes Intravenous Every 48 hours 08/24/16 1613 08/25/16 1250   08/24/16 1515  vancomycin (VANCOCIN) 1,500 mg in sodium chloride 0.9 % 500 mL IVPB     1,500 mg 250 mL/hr over 120 Minutes Intravenous  Once 08/24/16 1503 08/24/16 1754         HPI/Subjective: Main complaint is diarrhea, 5 episodes today  Objective: Vitals:   08/25/16 0505 08/25/16 1419 08/25/16 2040 08/26/16 0647  BP: (!) 102/48 (!) 116/44 (!) 128/59 112/62  Pulse: 71 75 79 81  Resp: 18 20  18 18  Temp: 98.1 F (36.7 C) 97.9 F (36.6 C) 97.7 F (36.5 C) 97.5 F (36.4 C)  TempSrc: Oral Oral Oral   SpO2: 99% 96% 96% 97%  Weight:      Height:        Intake/Output Summary (Last 24 hours) at 08/26/16 0835 Last data filed at 08/26/16 0600  Gross per 24 hour  Intake           2947.5 ml  Output             1950 ml  Net            997.5 ml    Exam:  Examination:  General exam: Appears calm and comfortable  Respiratory  system: Clear to auscultation. Respiratory effort normal. Cardiovascular system: S1 & S2 heard, RRR. No JVD, murmurs, rubs, gallops or clicks. No pedal edema. Gastrointestinal system: Abdomen is nondistended, soft and nontender. No organomegaly or masses felt. Normal bowel sounds heard. Central nervous system: Alert and oriented. No focal neurological deficits. Extremities: Symmetric 5 x 5 power. Skin: No rashes, lesions or ulcers Psychiatry: Judgement and insight appear normal. Mood & affect appropriate.     Data Reviewed: I have personally reviewed following labs and imaging studies  Micro Results Recent Results (from the past 240 hour(s))  Culture, blood (Routine x 2)     Status: None (Preliminary result)   Collection Time: 08/24/16 12:20 PM  Result Value Ref Range Status   Specimen Description BLOOD LEFT ANTECUBITAL  Final   Special Requests   Final    BOTTLES DRAWN AEROBIC AND ANAEROBIC Blood Culture adequate volume   Culture NO GROWTH 1 DAY  Final   Report Status PENDING  Incomplete  C difficile quick scan w PCR reflex     Status: None   Collection Time: 08/25/16  4:34 PM  Result Value Ref Range Status   C Diff antigen NEGATIVE NEGATIVE Final   C Diff toxin NEGATIVE NEGATIVE Final   C Diff interpretation No C. difficile detected.  Final  Surgical pcr screen     Status: None   Collection Time: 08/25/16  6:45 PM  Result Value Ref Range Status   MRSA, PCR NEGATIVE NEGATIVE Final   Staphylococcus aureus NEGATIVE NEGATIVE Final    Comment:        The Xpert SA Assay (FDA approved for NASAL specimens in patients over 76 years of age), is one component of a comprehensive surveillance program.  Test performance has been validated by [facility] for patients greater than or equal to 57 year old. It is not intended to diagnose infection nor to guide or monitor treatment.     Radiology Reports Dg Chest 2 View  Result Date: 08/24/2016 CLINICAL DATA:  Fever EXAM: CHEST  2  VIEW COMPARISON:  07/30/2016 FINDINGS: Cardiomediastinal silhouette is stable. Mild streaky left basilar atelectasis or scarring. No infiltrate or pleural effusion. There is right arm PICC line with tip in SVC. IMPRESSION: No infiltrate or pleural effusion. Mild streaky bilateral basilar atelectasis or scarring. Right arm PICC line with tip in SVC. Electronically Signed   By: Lahoma Crocker M.D.   On: 08/24/2016 14:16   Ct Head Wo Contrast  Result Date: 08/24/2016 CLINICAL DATA:  76 year old female with altered mental status. EXAM: CT HEAD WITHOUT CONTRAST TECHNIQUE: Contiguous axial images were obtained from the base of the skull through the vertex without intravenous contrast. COMPARISON:  None. FINDINGS: Brain: No evidence of acute infarction, hemorrhage, hydrocephalus, extra-axial  collection or mass lesion/mass effect. Mild generalized cerebral volume loss noted. Vascular: No hyperdense vessel or unexpected calcification. Skull: Normal. Negative for fracture or focal lesion. Sinuses/Orbits: No acute finding. Other: None. IMPRESSION: No evidence of acute intracranial abnormality. Electronically Signed   By: Margarette Canada M.D.   On: 08/24/2016 14:22   Ir US Guide Vasc Access Right  Result Date: 07/30/2016 INDICATION: 76 year old with left foot ulcer and possible osteomyelitis. PICC line needed for antibiotics. EXAM: RIGHT ARM PICC LINE PLACEMENT WITH ULTRASOUND AND FLUOROSCOPIC GUIDANCE MEDICATIONS: None ANESTHESIA/SEDATION: None FLUOROSCOPY TIME:  Fluoroscopy Time: 18 seconds, 0.6 mGy COMPLICATIONS: None immediate. PROCEDURE: The patient was advised of the possible risks and complications and agreed to undergo the procedure. The patient was then brought to the angiographic suite for the procedure. The right arm was prepped with chlorhexidine, draped in the usual sterile fashion using maximum barrier technique (cap and mask, sterile gown, sterile gloves, large sterile sheet, hand hygiene and cutaneous  antisepsis) and infiltrated locally with 1% Lidocaine. Ultrasound demonstrated patency of the right basilic vein, and this was documented with an image. Under real-time ultrasound guidance, this vein was accessed with a 21 gauge micropuncture needle and image documentation was performed. A 0.018 wire was introduced in to the vein. Over this, a 5 [place] single lumen power injectable PICC was advanced to the lower SVC/right atrial junction. Fluoroscopy during the procedure and fluoro spot radiograph confirms appropriate catheter position. The catheter was flushed and covered with a sterile dressing. Catheter length: 37 cm IMPRESSION: Successful right arm Power PICC line placement with ultrasound and fluoroscopic guidance. The catheter is ready for use. Electronically Signed   By: Markus Daft M.D.   On: 07/30/2016 09:47   Dg Toe Great Left  Result Date: 08/24/2016 CLINICAL DATA:  Open wound of the proximal great toe. EXAM: LEFT GREAT TOE COMPARISON:  06/12/2016 radiographs and 07/02/2016 MR FINDINGS: Fragmentation and osteolysis of the first metatarsal head is compatible with osteomyelitis. There is apparent cortical loss at the base of the great toe proximal phalanx worrisome for acute osteomyelitis as well. No soft tissue gas is identified. IMPRESSION: Osteomyelitis of the first metatarsal head and base of the great toe proximal phalanx. Electronically Signed   By: Margarette Canada M.D.   On: 08/24/2016 14:18   Ir Fluoro Guide Cv Midline Picc Right  Result Date: 07/30/2016 INDICATION: 76 year old with left foot ulcer and possible osteomyelitis. PICC line needed for antibiotics. EXAM: RIGHT ARM PICC LINE PLACEMENT WITH ULTRASOUND AND FLUOROSCOPIC GUIDANCE MEDICATIONS: None ANESTHESIA/SEDATION: None FLUOROSCOPY TIME:  Fluoroscopy Time: 18 seconds, 0.6 mGy COMPLICATIONS: None immediate. PROCEDURE: The patient was advised of the possible risks and complications and agreed to undergo the procedure. The patient was then  brought to the angiographic suite for the procedure. The right arm was prepped with chlorhexidine, draped in the usual sterile fashion using maximum barrier technique (cap and mask, sterile gown, sterile gloves, large sterile sheet, hand hygiene and cutaneous antisepsis) and infiltrated locally with 1% Lidocaine. Ultrasound demonstrated patency of the right basilic vein, and this was documented with an image. Under real-time ultrasound guidance, this vein was accessed with a 21 gauge micropuncture needle and image documentation was performed. A 0.018 wire was introduced in to the vein. Over this, a 5 [place] single lumen power injectable PICC was advanced to the lower SVC/right atrial junction. Fluoroscopy during the procedure and fluoro spot radiograph confirms appropriate catheter position. The catheter was flushed and covered with a sterile dressing. Catheter length:  37 cm IMPRESSION: Successful right arm Power PICC line placement with ultrasound and fluoroscopic guidance. The catheter is ready for use. Electronically Signed   By: Markus Daft M.D.   On: 07/30/2016 09:47     CBC  Recent Labs Lab 08/24/16 1218 08/24/16 1702 08/25/16 0430 08/26/16 0531  WBC 4.7 4.3 4.2 4.4  HGB 9.6* 9.3* 9.0* 8.5*  HCT 31.0* 30.5* 29.2* 27.2*  PLT 180 175 110* 198  MCV 89.6 88.9 89.3 86.9  MCH 27.7 27.1 27.5 27.2  MCHC 31.0 30.5 30.8 31.3  RDW 14.4 14.1 14.2 14.2  LYMPHSABS 1.0  --   --   --   MONOABS 0.3  --   --   --   EOSABS 1.2*  --   --   --   BASOSABS 0.0  --   --   --     Chemistries   Recent Labs Lab 08/24/16 1218 08/24/16 1702 08/25/16 0644 08/26/16 0531  NA 142  --  143 146*  K 4.6  --  4.9 4.3  CL 108  --  113* 117*  CO2 25  --  23 21*  GLUCOSE 110*  --  107* 108*  BUN 48*  --  37* 25*  CREATININE 3.75* 3.48* 2.68* 1.95*  CALCIUM 8.6*  --  8.3* 8.5*  MG  --  1.8  --   --   AST 30  --   --  20  ALT 23  --   --  19  ALKPHOS 65  --   --  56  BILITOT 0.6  --   --  0.6    ------------------------------------------------------------------------------------------------------------------ estimated creatinine clearance is 22.6 mL/min (A) (by C-G formula based on SCr of 1.95 mg/dL (H)). ------------------------------------------------------------------------------------------------------------------  Recent Labs  08/25/16 0644  HGBA1C 7.2*   ------------------------------------------------------------------------------------------------------------------ No results for input(s): CHOL, HDL, LDLCALC, TRIG, CHOLHDL, LDLDIRECT in the last 72 hours. ------------------------------------------------------------------------------------------------------------------ No results for input(s): TSH, T4TOTAL, T3FREE, THYROIDAB in the last 72 hours.  Invalid input(s): FREET3 ------------------------------------------------------------------------------------------------------------------ No results for input(s): VITAMINB12, FOLATE, FERRITIN, TIBC, IRON, RETICCTPCT in the last 72 hours.  Coagulation profile  Recent Labs Lab 08/24/16 1218  INR 1.26    No results for input(s): DDIMER in the last 72 hours.  Cardiac Enzymes No results for input(s): CKMB, TROPONINI, MYOGLOBIN in the last 168 hours.  Invalid input(s): CK ------------------------------------------------------------------------------------------------------------------ Invalid input(s): POCBNP   CBG:  Recent Labs Lab 08/24/16 1758 08/24/16 2316 08/25/16 0656 08/25/16 1735 08/26/16 0742  GLUCAP 119* 132* 100* 122* 102*       Studies: Dg Chest 2 View  Result Date: 08/24/2016 CLINICAL DATA:  Fever EXAM: CHEST  2 VIEW COMPARISON:  07/30/2016 FINDINGS: Cardiomediastinal silhouette is stable. Mild streaky left basilar atelectasis or scarring. No infiltrate or pleural effusion. There is right arm PICC line with tip in SVC. IMPRESSION: No infiltrate or pleural effusion. Mild streaky bilateral  basilar atelectasis or scarring. Right arm PICC line with tip in SVC. Electronically Signed   By: Lahoma Crocker M.D.   On: 08/24/2016 14:16   Ct Head Wo Contrast  Result Date: 08/24/2016 CLINICAL DATA:  76 year old female with altered mental status. EXAM: CT HEAD WITHOUT CONTRAST TECHNIQUE: Contiguous axial images were obtained from the base of the skull through the vertex without intravenous contrast. COMPARISON:  None. FINDINGS: Brain: No evidence of acute infarction, hemorrhage, hydrocephalus, extra-axial collection or mass lesion/mass effect. Mild generalized cerebral volume loss noted. Vascular: No hyperdense vessel or unexpected calcification. Skull: Normal. Negative  for fracture or focal lesion. Sinuses/Orbits: No acute finding. Other: None. IMPRESSION: No evidence of acute intracranial abnormality. Electronically Signed   By: Margarette Canada M.D.   On: 08/24/2016 14:22   Dg Toe Great Left  Result Date: 08/24/2016 CLINICAL DATA:  Open wound of the proximal great toe. EXAM: LEFT GREAT TOE COMPARISON:  06/12/2016 radiographs and 07/02/2016 MR FINDINGS: Fragmentation and osteolysis of the first metatarsal head is compatible with osteomyelitis. There is apparent cortical loss at the base of the great toe proximal phalanx worrisome for acute osteomyelitis as well. No soft tissue gas is identified. IMPRESSION: Osteomyelitis of the first metatarsal head and base of the great toe proximal phalanx. Electronically Signed   By: Margarette Canada M.D.   On: 08/24/2016 14:18      Lab Results  Component Value Date   HGBA1C 7.2 (H) 08/25/2016   Lab Results  Component Value Date   CREATININE 1.95 (H) 08/26/2016       Scheduled Meds: . allopurinol  100 mg Oral BID  . aspirin EC  81 mg Oral Daily  . hydrocortisone   Rectal TID  . insulin aspart  0-9 Units Subcutaneous TID WC  . levofloxacin (LEVAQUIN) IV  500 mg Intravenous Q48H  . linezolid (ZYVOX) IV  600 mg Intravenous Q12H  . povidone-iodine  2  application Topical Once  . saccharomyces boulardii  250 mg Oral BID  . sodium chloride flush  3 mL Intravenous Q12H  . vitamin C  500 mg Oral Daily   Continuous Infusions: . sodium chloride    . dextrose 5 % and 0.45 % NaCl with KCl 20 mEq/L 50 mL/hr at 08/26/16 0032     LOS: 2 days    Time spent: >30 MINS    Reyne Dumas  [facility] Pager [phone]. If 7PM-7AM, please contact night-coverage at [email or web address], password [facility] 08/26/2016, 8:35 AM  LOS: 2 days

## 2016-08-26 NOTE — Progress Notes (Addendum)
Orthopedic Tech Progress Note Patient Details:  Angela Castro 06/02/1940 [id number]  Ortho Devices Type of Ortho Device: Postop shoe/boot Ortho Device/Splint Location: LUE Ortho Device/Splint Interventions: Ordered, Application   Braulio Bosch 08/26/2016, 4:44 PM

## 2016-08-26 NOTE — Brief Op Note (Signed)
08/24/2016 - 08/26/2016  1:44 PM  PATIENT:  Angela Castro  76 y.o. female  PRE-OPERATIVE DIAGNOSIS:  LEFT HALLUX and 1st MT OSTEOMYELITIS  POST-OPERATIVE DIAGNOSIS:  same  Procedure(s):  Left 1st ray amputation  SURGEON:  Wylene Simmer, MD  ASSISTANT: Mechele Claude, PA-C  ANESTHESIA:   General  EBL:  minimal   TOURNIQUET:  15 min with ankle esmarch  COMPLICATIONS:  None apparent  DISPOSITION:  Extubated, awake and stable to recovery.  DICTATION ID:  [id number]

## 2016-08-26 NOTE — Discharge Instructions (Signed)
Wylene Simmer, MD [place]  Please read the following information regarding your care after surgery.  Medications  You only need a prescription for the narcotic pain medicine (ex. oxycodone, Percocet, Norco).  All of the other medicines listed below are available over the counter. X acetominophen (Tylenol) 650 mg every 4-6 hours as you need for minor pain X aleve 220 mg - 2 tablets twice daily with food as needed for pain ?   Narcotic pain medicine (ex. oxycodone, Percocet, Vicodin) will cause constipation.  To prevent this problem, take the following medicines while you are taking any pain medicine, if any of these medications are prescribed. X docusate sodium (Colace) 100 mg twice a day X senna (Senokot) 2 tablets twice a day   Weight Bearing X Bear weight only on the heel of your operated foot in the post-op shoe.   Dressing X Keep your splint or cast clean and dry.  Dont put anything (coat hanger, pencil, etc) down inside of it.  If it gets damp, use a hair dryer on the cool setting to dry it.  If it gets soaked, call the office to schedule an appointment for a dressing change.   After your dressing, cast or splint is removed; you may shower, but do not soak or scrub the wound.  Allow the water to run over it, and then gently pat it dry.  Swelling It is normal for you to have swelling where you had surgery.  To reduce swelling and pain, keep your toes above your nose for at least 3 days after surgery.  It may be necessary to keep your foot or leg elevated for several weeks.  If it hurts, it should be elevated.  Follow Up Call my office at [phone] when you are discharged from the hospital or surgery center to schedule an appointment to be seen two weeks after surgery.  Call my office at [phone] if you develop a fever >101.5 F, nausea, vomiting, bleeding from the surgical site or severe pain.

## 2016-08-26 NOTE — Transfer of Care (Signed)
Immediate Anesthesia Transfer of Care Note  Patient: Angela Castro  Procedure(s) Performed: Procedure(s): AMPUTATION RAY/1ST (Left)  Patient Location: PACU  Anesthesia Type:General  Level of Consciousness: awake, alert  and oriented  Airway & Oxygen Therapy: Patient Spontanous Breathing and Patient connected to nasal cannula oxygen  Post-op Assessment: Report given to RN, Post -op Vital signs reviewed and stable and Patient moving all extremities X 4  Post vital signs: Reviewed and stable  Last Vitals:  Vitals:   08/25/16 2040 08/26/16 0647  BP: (!) 128/59 112/62  Pulse: 79 81  Resp: 18 18  Temp: 36.5 C 36.4 C    Last Pain:  Vitals:   08/25/16 2100  TempSrc:   PainSc: 0-No pain         Complications: No apparent anesthesia complications

## 2016-08-26 NOTE — Progress Notes (Signed)
Orthopedic Tech Progress Note Patient Details:  Angela Castro January 31, 1941 [id number]  Ortho Devices Type of Ortho Device: Postop shoe/boot Ortho Device/Splint Location: LLE Ortho Device/Splint Interventions: Ordered, Application   Braulio Bosch 08/26/2016, 4:45 PM

## 2016-08-26 NOTE — Progress Notes (Signed)
VASCULAR LAB PRELIMINARY  ARTERIAL  ABI completed: Right ABI of 1.11 and left ABI of 1.14 are suggestive of arterial flow within normal limits at rest.   RIGHT    LEFT    PRESSURE WAVEFORM  PRESSURE WAVEFORM  BRACHIAL IV Triphasic BRACHIAL 142 Triphasic  DP 158 Triphasic DP 153 Triphasic  PT 153 Triphasic PT 162 Triphasic    RIGHT LEFT  ABI 1.11 1.14     Legrand Como, RVT 08/26/2016, 10:02 AM

## 2016-08-26 NOTE — Progress Notes (Signed)
Rings removed and given to husband.

## 2016-08-26 NOTE — Anesthesia Postprocedure Evaluation (Addendum)
Anesthesia Post Note  Patient: Angela Castro  Procedure(s) Performed: Procedure(s) (LRB): AMPUTATION RAY/1ST (Left)  Patient location during evaluation: PACU Anesthesia Type: General Level of consciousness: awake, awake and alert and oriented Pain management: pain level controlled Vital Signs Assessment: post-procedure vital signs reviewed and stable Respiratory status: spontaneous breathing, nonlabored ventilation and respiratory function stable Cardiovascular status: blood pressure returned to baseline Anesthetic complications: no       Last Vitals:  Vitals:   08/26/16 1727 08/26/16 1914  BP: (!) 123/49 111/60  Pulse: 73 73  Resp: 15 17  Temp: 36.8 C 36.7 C    Last Pain:  Vitals:   08/26/16 1914  TempSrc: Oral  PainSc:                  Bijon Mineer COKER

## 2016-08-26 NOTE — Anesthesia Procedure Notes (Signed)
Procedure Name: LMA Insertion Date/Time: 08/26/2016 1:15 PM Performed by: Gaylene Brooks Pre-anesthesia Checklist: Patient identified, Emergency Drugs available, Suction available and Patient being monitored Patient Re-evaluated:Patient Re-evaluated prior to inductionOxygen Delivery Method: Circle System Utilized Preoxygenation: Pre-oxygenation with 100% oxygen Intubation Type: IV induction Ventilation: Mask ventilation without difficulty LMA: LMA inserted LMA Size: 4.0 Number of attempts: 1 Placement Confirmation: positive ETCO2 Tube secured with: Tape Dental Injury: Teeth and Oropharynx as per pre-operative assessment

## 2016-08-26 NOTE — Progress Notes (Addendum)
INFECTIOUS DISEASE PROGRESS NOTE  ID: Angela Castro is a 76 y.o. female with  Principal Problem:   Osteomyelitis ([place]) Active Problems:   Weakness   Diabetes mellitus type 2 in obese [facility])   Acute-on-chronic renal failure (HCC)  Subjective: Without complaints Rash better  Abtx:  Anti-infectives    Start     Dose/Rate Route Frequency Ordered Stop   08/25/16 1800  levofloxacin (LEVAQUIN) IVPB 500 mg     500 mg 100 mL/hr over 60 Minutes Intravenous Every 48 hours 08/25/16 1623     08/25/16 1800  linezolid (ZYVOX) IVPB 600 mg     600 mg 300 mL/hr over 60 Minutes Intravenous Every 12 hours 08/25/16 1623     08/24/16 1700  DAPTOmycin (CUBICIN) 600 mg in sodium chloride 0.9 % IVPB  Status:  Discontinued     600 mg 224 mL/hr over 30 Minutes Intravenous Every 24 hours 08/24/16 1554 08/24/16 1613   08/24/16 1615  DAPTOmycin (CUBICIN) 600 mg in sodium chloride 0.9 % IVPB  Status:  Discontinued     600 mg 224 mL/hr over 30 Minutes Intravenous Every 48 hours 08/24/16 1613 08/25/16 1250   08/24/16 1515  vancomycin (VANCOCIN) 1,500 mg in sodium chloride 0.9 % 500 mL IVPB     1,500 mg 250 mL/hr over 120 Minutes Intravenous  Once 08/24/16 1503 08/24/16 1754      Medications:  Scheduled: . allopurinol  100 mg Oral BID  . aspirin EC  81 mg Oral Daily  . calcium-vitamin D  1 tablet Oral Q breakfast  . hydrocortisone   Rectal TID  . insulin aspart  0-9 Units Subcutaneous TID WC  . saccharomyces boulardii  250 mg Oral BID  . sodium chloride flush  3 mL Intravenous Q12H  . vitamin C  500 mg Oral Daily    Objective: Vital signs in last 24 hours: Temp:  [97.5 F (36.4 C)-98.3 F (36.8 C)] 98.3 F (36.8 C) (04/17 1727) Pulse Rate:  [73-81] 73 (04/17 1727) Resp:  [11-18] 15 (04/17 1727) BP: (112-128)/(49-63) 123/49 (04/17 1727) SpO2:  [95 %-98 %] 98 % (04/17 1727)   General appearance: alert, cooperative and no distress Skin: erythema improved Incision/Wound: L foot dressed.    Lab Results  Recent Labs  08/25/16 0430 08/25/16 0644 08/26/16 0531  WBC 4.2  --  4.4  HGB 9.0*  --  8.5*  HCT 29.2*  --  27.2*  NA  --  143 146*  K  --  4.9 4.3  CL  --  113* 117*  CO2  --  23 21*  BUN  --  37* 25*  CREATININE  --  2.68* 1.95*   Liver Panel  Recent Labs  08/24/16 1218 08/26/16 0531  PROT 5.7* 5.2*  ALBUMIN 2.5* 2.2*  AST 30 20  ALT 23 19  ALKPHOS 65 56  BILITOT 0.6 0.6   Sedimentation Rate No results for input(s): ESRSEDRATE in the last 72 hours. C-Reactive Protein No results for input(s): CRP in the last 72 hours.  Microbiology: Recent Results (from the past 240 hour(s))  Culture, blood (Routine x 2)     Status: None (Preliminary result)   Collection Time: 08/24/16 12:20 PM  Result Value Ref Range Status   Specimen Description BLOOD LEFT ANTECUBITAL  Final   Special Requests   Final    BOTTLES DRAWN AEROBIC AND ANAEROBIC Blood Culture adequate volume   Culture NO GROWTH 2 DAYS  Final   Report Status PENDING  Incomplete  C difficile quick scan w PCR reflex     Status: None   Collection Time: 08/25/16  4:34 PM  Result Value Ref Range Status   C Diff antigen NEGATIVE NEGATIVE Final   C Diff toxin NEGATIVE NEGATIVE Final   C Diff interpretation No C. difficile detected.  Final  Surgical pcr screen     Status: None   Collection Time: 08/25/16  6:45 PM  Result Value Ref Range Status   MRSA, PCR NEGATIVE NEGATIVE Final   Staphylococcus aureus NEGATIVE NEGATIVE Final    Comment:        The Xpert SA Assay (FDA approved for NASAL specimens in patients over 7 years of age), is one component of a comprehensive surveillance program.  Test performance has been validated by [facility] for patients greater than or equal to 28 year old. It is not intended to diagnose infection nor to guide or monitor treatment.     Studies/Results: No results found.   Assessment/Plan: L 1st toe osteo Would give her zyvox and levaquin for 5 days  post-op (per IDSA guidelines for clean margin) She can f/u with pcp  DM  ADR Improving  Loose stool C diff (-) Will d/c isolation  She has swelling and pain in her LUE, I would strongly consider venous u/s  Available as needed.   Total days of antibiotics: 1 levaquin/zyvox         Bobby Rumpf Infectious Diseases (pager) [phone] [email or web address] 08/26/2016, 5:40 PM  LOS: 2 days

## 2016-08-27 ENCOUNTER — Inpatient Hospital Stay (HOSPITAL_COMMUNITY): Payer: Medicare Other

## 2016-08-27 ENCOUNTER — Encounter (HOSPITAL_COMMUNITY): Payer: Self-pay | Admitting: Orthopedic Surgery

## 2016-08-27 DIAGNOSIS — M86172 Other acute osteomyelitis, left ankle and foot: Secondary | ICD-10-CM

## 2016-08-27 LAB — CBC
HCT: 24.9 % — ABNORMAL LOW (ref 36.0–46.0)
Hemoglobin: 7.7 g/dL — ABNORMAL LOW (ref 12.0–15.0)
MCH: 27 pg (ref 26.0–34.0)
MCHC: 30.9 g/dL (ref 30.0–36.0)
MCV: 87.4 fL (ref 78.0–100.0)
PLATELETS: 186 10*3/uL (ref 150–400)
RBC: 2.85 MIL/uL — AB (ref 3.87–5.11)
RDW: 14.2 % (ref 11.5–15.5)
WBC: 4.7 10*3/uL (ref 4.0–10.5)

## 2016-08-27 LAB — COMPREHENSIVE METABOLIC PANEL
ALT: 21 U/L (ref 14–54)
AST: 26 U/L (ref 15–41)
Albumin: 2.2 g/dL — ABNORMAL LOW (ref 3.5–5.0)
Alkaline Phosphatase: 59 U/L (ref 38–126)
Anion gap: 6 (ref 5–15)
BUN: 15 mg/dL (ref 6–20)
CHLORIDE: 115 mmol/L — AB (ref 101–111)
CO2: 22 mmol/L (ref 22–32)
CREATININE: 1.64 mg/dL — AB (ref 0.44–1.00)
Calcium: 8.3 mg/dL — ABNORMAL LOW (ref 8.9–10.3)
GFR calc Af Amer: 34 mL/min — ABNORMAL LOW (ref >60)
GFR calc non Af Amer: 30 mL/min — ABNORMAL LOW (ref >60)
Glucose, Bld: 133 mg/dL — ABNORMAL HIGH (ref 65–99)
Potassium: 4.3 mmol/L (ref 3.5–5.1)
Sodium: 143 mmol/L (ref 135–145)
TOTAL PROTEIN: 4.9 g/dL — AB (ref 6.5–8.1)
Total Bilirubin: 0.4 mg/dL (ref 0.3–1.2)

## 2016-08-27 LAB — FOLATE: FOLATE: 34.6 ng/mL (ref >5.9)

## 2016-08-27 LAB — FERRITIN: Ferritin: 54 ng/mL (ref 11–307)

## 2016-08-27 LAB — IRON AND TIBC
Iron: 23 ug/dL — ABNORMAL LOW (ref 28–170)
SATURATION RATIOS: 9 % — AB (ref 10.4–31.8)
TIBC: 269 ug/dL (ref 250–450)
UIBC: 246 ug/dL

## 2016-08-27 LAB — VITAMIN B12: VITAMIN B 12: 1070 pg/mL — AB (ref 180–914)

## 2016-08-27 LAB — GLUCOSE, CAPILLARY
GLUCOSE-CAPILLARY: 108 mg/dL — AB (ref 65–99)
GLUCOSE-CAPILLARY: 162 mg/dL — AB (ref 65–99)
GLUCOSE-CAPILLARY: 183 mg/dL — AB (ref 65–99)
GLUCOSE-CAPILLARY: 188 mg/dL — AB (ref 65–99)

## 2016-08-27 LAB — RETICULOCYTES
RBC.: 3.15 MIL/uL — AB (ref 3.87–5.11)
RETIC CT PCT: 1.1 % (ref 0.4–3.1)
Retic Count, Absolute: 34.7 10*3/uL (ref 19.0–186.0)

## 2016-08-27 MED ORDER — BISOPROLOL FUMARATE 10 MG PO TABS
10.0000 mg | ORAL_TABLET | Freq: Every day | ORAL | Status: DC
  Administered 2016-08-27 – 2016-08-28 (×2): 10 mg via ORAL
  Filled 2016-08-27 (×2): qty 1

## 2016-08-27 MED ORDER — LOPERAMIDE HCL 2 MG PO CAPS: 2.0000 mg | ORAL_CAPSULE | Freq: Once | ORAL | Status: DC

## 2016-08-27 MED ORDER — LINEZOLID 600 MG PO TABS
600.0000 mg | ORAL_TABLET | Freq: Two times a day (BID) | ORAL | Status: DC
  Administered 2016-08-27 – 2016-08-28 (×3): 600 mg via ORAL
  Filled 2016-08-27 (×3): qty 1

## 2016-08-27 MED ORDER — ENOXAPARIN SODIUM 40 MG/0.4ML ~~LOC~~ SOLN
40.0000 mg | SUBCUTANEOUS | Status: DC
  Administered 2016-08-27: 40 mg via SUBCUTANEOUS
  Filled 2016-08-27: qty 0.4

## 2016-08-27 MED ORDER — LOPERAMIDE HCL 2 MG PO CAPS
2.0000 mg | ORAL_CAPSULE | Freq: Two times a day (BID) | ORAL | Status: DC | PRN
  Administered 2016-08-27: 2 mg via ORAL
  Filled 2016-08-27: qty 1

## 2016-08-27 MED ORDER — LEVOFLOXACIN 500 MG PO TABS
500.0000 mg | ORAL_TABLET | ORAL | Status: DC
  Administered 2016-08-27: 500 mg via ORAL
  Filled 2016-08-27: qty 1

## 2016-08-27 NOTE — Progress Notes (Addendum)
[facility] PROGRESS NOTE  Dimonique Bourdeau TGY:[id number] DOB: 10/14/1940 DOA: 08/24/2016   PCP: Pcp Not In System   76 year old female with a history of hypertension, diabetes, nonhealing left foot ulcer, followed by infectious disease, status post receiving teflaro.Patient had been on Teflaro since 3/20 for wound treatment. Patient subsequently developed a rash secondary to Teflaro and so this has been D/C'd and added to her allergy list. She presented 4/15 with fever and wound infection.. Infectious disease and orthopedics consulted for further management s/p 1st ray amputation   Assessment/Plan:  Osteomyelitis L foot,hallux and 1st MT -appreciate Ortho and ID consults -now on levaquin and Zyvox, had severe diarrhea after daptomycin -ABI unremarkable -s/p 1st ray amputation by Dr.Hewitt 4/17 -wound care, FU with ORtho  Diarrhea -abx associated -cdiff PCR negative -give imodium x1 today  LUE swelling -completely resolved -due to BP cuff in arm all day 4/16-4/17 and FLuid resusucitation  Generalized weakness Likely secondary to #1 PT consult in AM -gabapentin, claritin, ultram held by Dr.Abrol  Diabetes Sliding scale insulin Hold Lantus insulin & Tradjenta Hemoglobin A1c 7.2  Hypertension When necessary hydralazine 10 mg IV as needed for severe blood pressure Holding cozaar , resume Zebeta  AKI, in the setting of chronic kidney disease stage III -Baseline Cr 2.24, Cr on admit 3.75, now 2.68>1.95  after IV fluids Status post aggressive IV fluid hydration -stop IVF now  Asthma  Prn albuterol  Gout Cont allopurinol  Rash, secondary to teflaro, improving   DVT prophylaxsis Lovenox   Code Status:  Full code Family Communication: Discussed in detail with the patient, all imaging results, lab results explained to the patient and spouse Disposition Plan:  Home in 1-2days     Consultants:  Orthopedics  Procedures:  L 1st ray amputation  4/17  Antibiotics: Anti-infectives    Start     Dose/Rate Route Frequency Ordered Stop   08/27/16 1200  levofloxacin (LEVAQUIN) tablet 500 mg     500 mg Oral Every 48 hours 08/27/16 1004 09/10/16 1159   08/27/16 1100  linezolid (ZYVOX) tablet 600 mg     600 mg Oral Every 12 hours 08/27/16 1004 09/10/16 0959   08/25/16 1800  levofloxacin (LEVAQUIN) IVPB 500 mg  Status:  Discontinued     500 mg 100 mL/hr over 60 Minutes Intravenous Every 48 hours 08/25/16 1623 08/27/16 1004   08/25/16 1800  linezolid (ZYVOX) IVPB 600 mg  Status:  Discontinued     600 mg 300 mL/hr over 60 Minutes Intravenous Every 12 hours 08/25/16 1623 08/27/16 1004   08/24/16 1700  DAPTOmycin (CUBICIN) 600 mg in sodium chloride 0.9 % IVPB  Status:  Discontinued     600 mg 224 mL/hr over 30 Minutes Intravenous Every 24 hours 08/24/16 1554 08/24/16 1613   08/24/16 1615  DAPTOmycin (CUBICIN) 600 mg in sodium chloride 0.9 % IVPB  Status:  Discontinued     600 mg 224 mL/hr over 30 Minutes Intravenous Every 48 hours 08/24/16 1613 08/25/16 1250   08/24/16 1515  vancomycin (VANCOCIN) 1,500 mg in sodium chloride 0.9 % 500 mL IVPB     1,500 mg 250 mL/hr over 120 Minutes Intravenous  Once 08/24/16 1503 08/24/16 1754         HPI/Subjective: Feels weak, still has diarrhea, wants to go home  Objective: Vitals:   08/26/16 1430 08/26/16 1727 08/26/16 1914 08/27/16 0654  BP: 117/63 (!) 123/49 111/60 (!) 108/48  Pulse: 81 73 73 74  Resp:  11 15 17    Temp: 98.1 F (36.7 C) 98.3 F (36.8 C) 98 F (36.7 C) 98.1 F (36.7 C)  TempSrc:  Oral Oral Oral  SpO2: 95% 98% 98% 94%  Weight:      Height:        Intake/Output Summary (Last 24 hours) at 08/27/16 1421 Last data filed at 08/27/16 0737  Gross per 24 hour  Intake          1446.42 ml  Output             1575 ml  Net          -128.58 ml    Exam:  Examination:  General exam: Appears calm and comfortable, elderly chronically ill female Respiratory system: Clear to  auscultation. Respiratory effort normal. Cardiovascular system: S1 & S2 heard, RRR. No JVD, murmurs Gastrointestinal system: Abdomen is nondistended, soft and nontender. Normal bowel sounds heard. Central nervous system: Alert and oriented. No focal neurological deficits. Extremities: Symmetric 5 x 5 power. Skin: No rashes, lesions or ulcers Psychiatry: Judgement and insight appear normal. Mood & affect appropriate.     Data Reviewed: I have personally reviewed following labs and imaging studies  Micro Results Recent Results (from the past 240 hour(s))  Culture, blood (Routine x 2)     Status: None (Preliminary result)   Collection Time: 08/24/16 12:20 PM  Result Value Ref Range Status   Specimen Description BLOOD LEFT ANTECUBITAL  Final   Special Requests   Final    BOTTLES DRAWN AEROBIC AND ANAEROBIC Blood Culture adequate volume   Culture NO GROWTH 2 DAYS  Final   Report Status PENDING  Incomplete  C difficile quick scan w PCR reflex     Status: None   Collection Time: 08/25/16  4:34 PM  Result Value Ref Range Status   C Diff antigen NEGATIVE NEGATIVE Final   C Diff toxin NEGATIVE NEGATIVE Final   C Diff interpretation No C. difficile detected.  Final  Surgical pcr screen     Status: None   Collection Time: 08/25/16  6:45 PM  Result Value Ref Range Status   MRSA, PCR NEGATIVE NEGATIVE Final   Staphylococcus aureus NEGATIVE NEGATIVE Final    Comment:        The Xpert SA Assay (FDA approved for NASAL specimens in patients over 69 years of age), is one component of a comprehensive surveillance program.  Test performance has been validated by [facility] for patients greater than or equal to 77 year old. It is not intended to diagnose infection nor to guide or monitor treatment.     Radiology Reports Dg Chest 2 View  Result Date: 08/24/2016 CLINICAL DATA:  Fever EXAM: CHEST  2 VIEW COMPARISON:  07/30/2016 FINDINGS: Cardiomediastinal silhouette is stable. Mild streaky  left basilar atelectasis or scarring. No infiltrate or pleural effusion. There is right arm PICC line with tip in SVC. IMPRESSION: No infiltrate or pleural effusion. Mild streaky bilateral basilar atelectasis or scarring. Right arm PICC line with tip in SVC. Electronically Signed   By: Lahoma Crocker M.D.   On: 08/24/2016 14:16   Ct Head Wo Contrast  Result Date: 08/24/2016 CLINICAL DATA:  76 year old female with altered mental status. EXAM: CT HEAD WITHOUT CONTRAST TECHNIQUE: Contiguous axial images were obtained from the base of the skull through the vertex without intravenous contrast. COMPARISON:  None. FINDINGS: Brain: No evidence of acute infarction, hemorrhage, hydrocephalus, extra-axial collection or mass lesion/mass effect. Mild generalized cerebral volume loss  noted. Vascular: No hyperdense vessel or unexpected calcification. Skull: Normal. Negative for fracture or focal lesion. Sinuses/Orbits: No acute finding. Other: None. IMPRESSION: No evidence of acute intracranial abnormality. Electronically Signed   By: Margarette Canada M.D.   On: 08/24/2016 14:22   Ir US Guide Vasc Access Right  Result Date: 07/30/2016 INDICATION: 76 year old with left foot ulcer and possible osteomyelitis. PICC line needed for antibiotics. EXAM: RIGHT ARM PICC LINE PLACEMENT WITH ULTRASOUND AND FLUOROSCOPIC GUIDANCE MEDICATIONS: None ANESTHESIA/SEDATION: None FLUOROSCOPY TIME:  Fluoroscopy Time: 18 seconds, 0.6 mGy COMPLICATIONS: None immediate. PROCEDURE: The patient was advised of the possible risks and complications and agreed to undergo the procedure. The patient was then brought to the angiographic suite for the procedure. The right arm was prepped with chlorhexidine, draped in the usual sterile fashion using maximum barrier technique (cap and mask, sterile gown, sterile gloves, large sterile sheet, hand hygiene and cutaneous antisepsis) and infiltrated locally with 1% Lidocaine. Ultrasound demonstrated patency of the right  basilic vein, and this was documented with an image. Under real-time ultrasound guidance, this vein was accessed with a 21 gauge micropuncture needle and image documentation was performed. A 0.018 wire was introduced in to the vein. Over this, a 5 [place] single lumen power injectable PICC was advanced to the lower SVC/right atrial junction. Fluoroscopy during the procedure and fluoro spot radiograph confirms appropriate catheter position. The catheter was flushed and covered with a sterile dressing. Catheter length: 37 cm IMPRESSION: Successful right arm Power PICC line placement with ultrasound and fluoroscopic guidance. The catheter is ready for use. Electronically Signed   By: Markus Daft M.D.   On: 07/30/2016 09:47   Dg Toe Great Left  Result Date: 08/24/2016 CLINICAL DATA:  Open wound of the proximal great toe. EXAM: LEFT GREAT TOE COMPARISON:  06/12/2016 radiographs and 07/02/2016 MR FINDINGS: Fragmentation and osteolysis of the first metatarsal head is compatible with osteomyelitis. There is apparent cortical loss at the base of the great toe proximal phalanx worrisome for acute osteomyelitis as well. No soft tissue gas is identified. IMPRESSION: Osteomyelitis of the first metatarsal head and base of the great toe proximal phalanx. Electronically Signed   By: Margarette Canada M.D.   On: 08/24/2016 14:18   Ir Fluoro Guide Cv Midline Picc Right  Result Date: 07/30/2016 INDICATION: 76 year old with left foot ulcer and possible osteomyelitis. PICC line needed for antibiotics. EXAM: RIGHT ARM PICC LINE PLACEMENT WITH ULTRASOUND AND FLUOROSCOPIC GUIDANCE MEDICATIONS: None ANESTHESIA/SEDATION: None FLUOROSCOPY TIME:  Fluoroscopy Time: 18 seconds, 0.6 mGy COMPLICATIONS: None immediate. PROCEDURE: The patient was advised of the possible risks and complications and agreed to undergo the procedure. The patient was then brought to the angiographic suite for the procedure. The right arm was prepped with chlorhexidine,  draped in the usual sterile fashion using maximum barrier technique (cap and mask, sterile gown, sterile gloves, large sterile sheet, hand hygiene and cutaneous antisepsis) and infiltrated locally with 1% Lidocaine. Ultrasound demonstrated patency of the right basilic vein, and this was documented with an image. Under real-time ultrasound guidance, this vein was accessed with a 21 gauge micropuncture needle and image documentation was performed. A 0.018 wire was introduced in to the vein. Over this, a 5 [place] single lumen power injectable PICC was advanced to the lower SVC/right atrial junction. Fluoroscopy during the procedure and fluoro spot radiograph confirms appropriate catheter position. The catheter was flushed and covered with a sterile dressing. Catheter length: 37 cm IMPRESSION: Successful right arm Power PICC line placement  with ultrasound and fluoroscopic guidance. The catheter is ready for use. Electronically Signed   By: Markus Daft M.D.   On: 07/30/2016 09:47     CBC  Recent Labs Lab 08/24/16 1218 08/24/16 1702 08/25/16 0430 08/26/16 0531 08/27/16 0601  WBC 4.7 4.3 4.2 4.4 4.7  HGB 9.6* 9.3* 9.0* 8.5* 7.7*  HCT 31.0* 30.5* 29.2* 27.2* 24.9*  PLT 180 175 110* 198 186  MCV 89.6 88.9 89.3 86.9 87.4  MCH 27.7 27.1 27.5 27.2 27.0  MCHC 31.0 30.5 30.8 31.3 30.9  RDW 14.4 14.1 14.2 14.2 14.2  LYMPHSABS 1.0  --   --   --   --   MONOABS 0.3  --   --   --   --   EOSABS 1.2*  --   --   --   --   BASOSABS 0.0  --   --   --   --     Chemistries   Recent Labs Lab 08/24/16 1218 08/24/16 1702 08/25/16 0644 08/26/16 0531 08/27/16 0601  NA 142  --  143 146* 143  K 4.6  --  4.9 4.3 4.3  CL 108  --  113* 117* 115*  CO2 25  --  23 21* 22  GLUCOSE 110*  --  107* 108* 133*  BUN 48*  --  37* 25* 15  CREATININE 3.75* 3.48* 2.68* 1.95* 1.64*  CALCIUM 8.6*  --  8.3* 8.5* 8.3*  MG  --  1.8  --   --   --   AST 30  --   --  20 26  ALT 23  --   --  19 21  ALKPHOS 65  --   --  56 59   BILITOT 0.6  --   --  0.6 0.4   ------------------------------------------------------------------------------------------------------------------ estimated creatinine clearance is 26.9 mL/min (A) (by C-G formula based on SCr of 1.64 mg/dL (H)). ------------------------------------------------------------------------------------------------------------------  Recent Labs  08/25/16 0644  HGBA1C 7.2*   ------------------------------------------------------------------------------------------------------------------ No results for input(s): CHOL, HDL, LDLCALC, TRIG, CHOLHDL, LDLDIRECT in the last 72 hours. ------------------------------------------------------------------------------------------------------------------ No results for input(s): TSH, T4TOTAL, T3FREE, THYROIDAB in the last 72 hours.  Invalid input(s): FREET3 ------------------------------------------------------------------------------------------------------------------  Recent Labs  08/27/16 1039  VITAMINB12 1,070*  FOLATE 34.6  FERRITIN 54  TIBC 269  IRON 23*  RETICCTPCT 1.1    Coagulation profile  Recent Labs Lab 08/24/16 1218  INR 1.26    No results for input(s): DDIMER in the last 72 hours.  Cardiac Enzymes No results for input(s): CKMB, TROPONINI, MYOGLOBIN in the last 168 hours.  Invalid input(s): CK ------------------------------------------------------------------------------------------------------------------ Invalid input(s): POCBNP   CBG:  Recent Labs Lab 08/26/16 1156 08/26/16 1353 08/26/16 1722 08/27/16 0754 08/27/16 1326  GLUCAP 109* 97 94 108* 162*       Studies: No results found.    Lab Results  Component Value Date   HGBA1C 7.2 (H) 08/25/2016   Lab Results  Component Value Date   CREATININE 1.64 (H) 08/27/2016       Scheduled Meds: . allopurinol  100 mg Oral BID  . aspirin EC  81 mg Oral Daily  . calcium-vitamin D  1 tablet Oral Q breakfast  .  hydrocortisone   Rectal TID  . insulin aspart  0-9 Units Subcutaneous TID WC  . levofloxacin  500 mg Oral Q48H  . linezolid  600 mg Oral Q12H  . saccharomyces boulardii  250 mg Oral BID  . sodium chloride flush  3 mL Intravenous Q12H  .  vitamin C  500 mg Oral Daily   Continuous Infusions:    LOS: 3 days    Time spent: >30 MINS    Youssef Footman MD [facility] Page via [email or web address], password [facility] 08/27/2016, 2:21 PM  LOS: 3 days

## 2016-08-27 NOTE — Op Note (Signed)
NAME:  Angela Castro, Angela Castro              ACCOUNT NO.:  [id number]  MEDICAL RECORD NO.:  [id number]  LOCATION:                                 FACILITY:  PHYSICIAN:  Wylene Simmer, MD             DATE OF BIRTH:  DATE OF PROCEDURE:  08/26/2016 DATE OF DISCHARGE:                              OPERATIVE REPORT   PREOPERATIVE DIAGNOSIS:  Left hallux and first metatarsal osteomyelitis.  POSTOPERATIVE DIAGNOSIS:  Left hallux and first metatarsal osteomyelitis.  PROCEDURE:  Left first ray amputation.  SURGEON:  Wylene Simmer, MD.  ASSISTANT:  Mechele Claude, PA-C.  ANESTHESIA:  General.  ESTIMATED BLOOD LOSS:  Minimal.  TOURNIQUET TIME:  Approximately 15 minutes with an ankle Esmarch.  COMPLICATIONS:  None apparent.  DISPOSITION:  Extubated awake and stable to recovery.  INDICATIONS FOR PROCEDURE:  The patient is a 76 year old woman with past medical history significant for diabetes.  She has a chronic ulcer of her left medial forefoot for approximately 9 months.  Radiographs and an MRI show extensive osteomyelitis of the first metatarsal head and the proximal phalanx of her left hallux.  She presents today for left first ray amputation in an effort to treat this chronic infection.  She understands the risks and benefits of the alternative treatment options and elects surgical treatment.  She specifically understands risks of bleeding, infection, nerve damage, blood clots, need for additional surgery, continued pain, revision amputation, and death.  PROCEDURE IN DETAIL:  After preoperative consent was obtained and the correct operative site was identified, the patient was brought to the operating room and placed supine on the operating table.  General anesthesia was induced.  The patient was already on therapeutic IV antibiotics.  Surgical time-out was taken.  Left lower extremity was then prepped and draped in standard sterile fashion.  Foot was exsanguinated and a 4-inch Esmarch  tourniquet was wrapped around the ankle.  A racquet style incision was made at the base of the hallux. Sharp dissection was carried down through the skin and subcutaneous tissue.  The head of the metatarsal was completely destroyed.  The toe was disarticulated through this area and passed off the field. Subperiosteal dissection was then carried along the first metatarsal shaft to an area where the bone appeared healthy.  A bevelled cut was made with the oscillating saw.  The distal portion of the bone was passed off the field.  The soft tissues surrounding the obviously infected portion of the bone was sharply excised as well.  The sesamoids were excised in their entirety.  The remaining soft tissue appeared generally healthy as did the remaining bone of the first metatarsal. Wound was irrigated copiously.  Neurovascular structures were all cauterized.  The incision was closed with simple and horizontal mattress sutures of 2-0 nylon.  Sterile dressings were applied followed by compression wrap.  Tourniquet was released after application of the dressings at approximately 15 minutes.  The patient was awakened from anesthesia and transported to the recovery room in stable condition.  FOLLOWUP PLAN:  The patient will be weightbearing as tolerated on the left foot in a flat postop shoe.  She will resume  anticoagulation tomorrow.  Mechele Claude, PA-C, was present and scrubbed for the duration of the case.  His assistance was essential in positioning the patient, prepping and draping, gaining and maintaining exposure, performing the operation, closing and dressing the wounds.     Wylene Simmer, MD     JH/MEDQ  D:  08/26/2016  T:  08/27/2016  Job:  [id number]

## 2016-08-27 NOTE — Evaluation (Signed)
Physical Therapy Evaluation Patient Details Name: Angela Castro MRN: [id number] DOB: 08-25-1940 Today's Date: 08/27/2016   History of Present Illness  pt is a 76 y/o female with h/o HTN, DM, nonhealing L foot ulcer, admitted with fever and wound infection.  Determined to have L hallux and ffirst MT osteomyelitis, s/p amputation 4/17.  Clinical Impression  Pt is close to baseline functioning given that she has been relatively immobile for weeks and should be safe at home with husband's assist.  She is still deconditioned and would benefit greatly from [place]. There are no further acute PT needs and all therapy needs can be managed at home.  Will sign off at this time in anticipation of d/c soon.     Follow Up Recommendations Home health PT    Equipment Recommendations       Recommendations for Other Services       Precautions / Restrictions Precautions Precautions: Fall Required Braces or Orthoses: Other Brace/Splint (flat orthopedic shoe.) Restrictions LLE Weight Bearing: Weight bearing as tolerated      Mobility  Bed Mobility Overal bed mobility: Needs Assistance Bed Mobility: Supine to Sit     Supine to sit: Supervision     General bed mobility comments: easily sits up and fairly easily scoots without rail or any assist  Transfers Overall transfer level: Needs assistance   Transfers: Sit to/from Stand Sit to Stand: Min assist         General transfer comment: effortful, but with little actual assist needed  Ambulation/Gait Ambulation/Gait assistance: Min assist;Min guard Ambulation Distance (Feet): 25 Feet Assistive device: Rolling walker (2 wheeled) Gait Pattern/deviations: Step-to pattern;Decreased step length - right;Decreased stride length;Decreased stance time - left Gait velocity: slower Gait velocity interpretation: Below normal speed for age/gender General Gait Details: cues for better/safer use of the RW, pt quick to fatigue and then needed min  stability assist.  Pt/husband should be able to safely mobility in the home.  Stairs            Wheelchair Mobility    Modified Rankin (Stroke Patients Only)       Balance Overall balance assessment: Needs assistance Sitting-balance support: No upper extremity supported Sitting balance-Leahy Scale: Fair     Standing balance support: Bilateral upper extremity supported Standing balance-Leahy Scale: Fair Standing balance comment: needed RW to ambulate not stand statically                             Pertinent Vitals/Pain Pain Assessment: 0-10 Pain Score: 3  Pain Location: L amp site Pain Descriptors / Indicators: Sore Pain Intervention(s): Monitored during session    Home Living Family/patient expects to be discharged to:: Private residence Living Arrangements: Spouse/significant other Available Help at Discharge: Family;Available 24 hours/day Type of Home: House Home Access: Stairs to enter   [employer] of Steps: 1/0 Home Layout: One level Home Equipment: Shower seat;Grab bars - tub/shower      Prior Function Level of Independence: Independent with assistive device(s)         Comments: pt takes sponge baths     Hand Dominance        Extremity/Trunk Assessment   Upper Extremity Assessment Upper Extremity Assessment: Overall WFL for tasks assessed (generally weak)    Lower Extremity Assessment Lower Extremity Assessment: Overall WFL for tasks assessed;Generalized weakness (proximal weakness, but functional)       Communication   Communication: No difficulties  Cognition Arousal/Alertness: Awake/alert Behavior  During Therapy: WFL for tasks assessed/performed Overall Cognitive Status: Within Functional Limits for tasks assessed (looks to husband for guidance )                                        General Comments      Exercises     Assessment/Plan    PT Assessment Patient needs continued PT  services  PT Problem List Decreased strength;Decreased activity tolerance;Decreased balance;Decreased mobility;Decreased knowledge of use of DME;Decreased knowledge of precautions;Pain       PT Treatment Interventions DME instruction;Gait training;Functional mobility training;Stair training;Therapeutic activities;Balance training;Patient/family education    PT Goals (Current goals can be found in the Care Plan section)  Acute Rehab PT Goals Patient Stated Goal: get home PT Goal Formulation: All assessment and education complete, DC therapy    Frequency Min 3X/week   Barriers to discharge        Co-evaluation               End of Session   Activity Tolerance: Patient tolerated treatment well Patient left: in chair;with chair alarm set;with family/visitor present Nurse Communication: Mobility status PT Visit Diagnosis: Unsteadiness on feet (R26.81);Other abnormalities of gait and mobility (R26.89)    Time: 2992-4268 PT Time Calculation (min) (ACUTE ONLY): 48 min   Charges:   PT Evaluation $PT Eval Moderate Complexity: 1 Procedure PT Treatments $Gait Training: 8-22 mins $Therapeutic Activity: 8-22 mins   PT G Codes:        29-Aug-2016  Donnella Sham, PT [phone]  (pager)  Tessie Fass Vernona Peake 08/29/2016, 1:10 PM

## 2016-08-27 NOTE — Progress Notes (Signed)
Subjective: 1 Day Post-Op Procedure(s) (LRB): AMPUTATION RAY/1ST (Left)  Patient reports pain as mild to moderate.  Tolerating POs well after surgery.  Admits to flatulence.  Denies fever, chills, N/V, SOB, CP.  Reported L UE swelling and pain yesterday.  Notes that the staff had a difficult time starting an IV on her initially in her L arm.  The L arm is also the arm that had the BP cuff on yesterday during surgical intervention.  She reports that her pain in the arm has subsided today.  Overall she reports that she feels much better and wants to go home.  Objective:   VITALS:  Temp:  [97.9 F (36.6 C)-98.3 F (36.8 C)] 98.1 F (36.7 C) (04/18 0654) Pulse Rate:  [73-81] 74 (04/18 0654) Resp:  [11-17] 17 (04/17 1914) BP: (108-123)/(48-63) 108/48 (04/18 0654) SpO2:  [94 %-98 %] 94 % (04/18 0654)  General: WDWN patient in NAD. Psych:  Appropriate mood and affect. Neuro:  A&O x 3, Moving all extremities, sensation intact to light touch HEENT:  EOMs intact Chest:  Even non-labored respirations Skin: Dressing C/D/I, no rashes or lesions Extremities: warm/dry, no visible edema, erythema, or echymosis.  No lymphadenopathy. Pulses: Popliteus 2+ MSK:  ROM: full ankle ROM, MMT: patient can perform quad set, (-) Homan's, non-tender to palpation of L UE, and (-) squeeze test of L UE.    LABS  Recent Labs  08/24/16 1218 08/24/16 1702 08/25/16 0430 08/26/16 0531 08/27/16 0601  HGB 9.6* 9.3* 9.0* 8.5* 7.7*  WBC 4.7 4.3 4.2 4.4 4.7  PLT 180 175 110* 198 186    Recent Labs  08/26/16 0531 08/27/16 0601  NA 146* 143  K 4.3 4.3  CL 117* 115*  CO2 21* 22  BUN 25* 15  CREATININE 1.95* 1.64*  GLUCOSE 108* 133*    Recent Labs  08/24/16 1218  INR 1.26     Assessment/Plan: 1 Day Post-Op Procedure(s) (LRB): AMPUTATION RAY/1ST (Left)  Up with therpay WBAT L LE in flat post-op shoe May reinforce dressing prn, but do not remove. Plan for 2 week outpatient post-op visit with  Dr. Doran Durand. Likely her LUE pain and swelling is due to many attempts of starting an IV in that arm, in conjunction with the BP cuff on that arm yesterday during surgery.  DVT of L UE less likely as pain is improved, however a doppler is not unreasonable.  Mechele Claude, PA-C [facility] Office:  [phone]

## 2016-08-28 ENCOUNTER — Telehealth: Payer: Self-pay | Admitting: *Deleted

## 2016-08-28 LAB — BASIC METABOLIC PANEL
Anion gap: 7 (ref 5–15)
BUN: 17 mg/dL (ref 6–20)
CALCIUM: 8.4 mg/dL — AB (ref 8.9–10.3)
CO2: 22 mmol/L (ref 22–32)
CREATININE: 1.62 mg/dL — AB (ref 0.44–1.00)
Chloride: 114 mmol/L — ABNORMAL HIGH (ref 101–111)
GFR calc Af Amer: 35 mL/min — ABNORMAL LOW (ref >60)
GFR, EST NON AFRICAN AMERICAN: 30 mL/min — AB (ref >60)
Glucose, Bld: 181 mg/dL — ABNORMAL HIGH (ref 65–99)
POTASSIUM: 4.2 mmol/L (ref 3.5–5.1)
SODIUM: 143 mmol/L (ref 135–145)

## 2016-08-28 LAB — CBC
HCT: 26.1 % — ABNORMAL LOW (ref 36.0–46.0)
Hemoglobin: 8.1 g/dL — ABNORMAL LOW (ref 12.0–15.0)
MCH: 27.2 pg (ref 26.0–34.0)
MCHC: 31 g/dL (ref 30.0–36.0)
MCV: 87.6 fL (ref 78.0–100.0)
PLATELETS: 220 10*3/uL (ref 150–400)
RBC: 2.98 MIL/uL — ABNORMAL LOW (ref 3.87–5.11)
RDW: 14.3 % (ref 11.5–15.5)
WBC: 3.7 10*3/uL — AB (ref 4.0–10.5)

## 2016-08-28 LAB — GLUCOSE, CAPILLARY: Glucose-Capillary: 151 mg/dL — ABNORMAL HIGH (ref 65–99)

## 2016-08-28 MED ORDER — SENNOSIDES-DOCUSATE SODIUM 8.6-50 MG PO TABS: 1.0000 | ORAL_TABLET | Freq: Every evening | ORAL | 0 refills | Status: DC | PRN

## 2016-08-28 MED ORDER — LINEZOLID 600 MG PO TABS: 600.0000 mg | ORAL_TABLET | Freq: Two times a day (BID) | ORAL | 0 refills | Status: DC

## 2016-08-28 MED ORDER — OXYCODONE-ACETAMINOPHEN 5-325 MG PO TABS: 1.0000 | ORAL_TABLET | Freq: Four times a day (QID) | ORAL | 0 refills | Status: DC | PRN

## 2016-08-28 MED ORDER — INSULIN GLARGINE 100 UNIT/ML SOLOSTAR PEN: 30.0000 [IU] | PEN_INJECTOR | Freq: Every day | SUBCUTANEOUS | Status: DC

## 2016-08-28 MED ORDER — DOXYCYCLINE HYCLATE 100 MG PO TABS: 100.0000 mg | ORAL_TABLET | Freq: Two times a day (BID) | ORAL | 0 refills | Status: AC

## 2016-08-28 MED ORDER — FERROUS SULFATE 325 (65 FE) MG PO TABS: 325.0000 mg | ORAL_TABLET | Freq: Two times a day (BID) | ORAL | 1 refills | Status: AC

## 2016-08-28 MED ORDER — LEVOFLOXACIN 500 MG PO TABS: 500.0000 mg | ORAL_TABLET | ORAL | 0 refills | Status: DC

## 2016-08-28 NOTE — Progress Notes (Signed)
NURSING PROGRESS NOTE  Angela Castro [id number] Discharge Data: 08/28/2016 12:35 PM Attending Provider: Domenic Polite, MD PCP:Pcp Not In [place] to be D/C'd Home per MD order.  Discussed with the patient the After Visit Summary and all questions fully answered. All IV's discontinued with no bleeding noted. All belongings returned to patient for patient to take home. Pt is being sent home with written prescriptions and a bedside commode.   Last Vital Signs:  Blood pressure (!) 129/51, pulse 75, temperature 97.8 F (36.6 C), resp. rate 16, height 5' (1.524 m), weight 75.3 kg (166 lb), SpO2 93 %.  Discharge Medication List Allergies as of 08/28/2016      Reactions   Sulfa Antibiotics Shortness Of Breath, Rash   Adhesive [tape] Other (See Comments)   Causes redness, please use paper tape   Keflex [cephalexin] Other (See Comments)   Possibly caused lethargy (taken with Flagyl)   Metronidazole Other (See Comments)   Possibly caused lethargy (taken with keflex)   Teflaro [ceftaroline] Other (See Comments)   Lethargy, possibly rash      Medication List    STOP taking these medications   losartan 100 MG tablet Commonly known as:  COZAAR     TAKE these medications   allopurinol 100 MG tablet Commonly known as:  ZYLOPRIM Take 100 mg by mouth 2 (two) times daily.   amLODipine 5 MG tablet Commonly known as:  NORVASC Take 5 mg by mouth daily.   aspirin EC 81 MG tablet Take 81 mg by mouth daily.   bisoprolol 10 MG tablet Commonly known as:  ZEBETA Take 10 mg by mouth daily.   Calcium-D 600-400 MG-UNIT Tabs Take 1 tablet by mouth daily.   clobetasol 0.05 % Gel Commonly known as:  TEMOVATE Apply 1 application topically daily as needed (psoriasis).   ferrous sulfate 325 (65 FE) MG tablet Commonly known as:  FERROUSUL Take 1 tablet (325 mg total) by mouth 2 (two) times daily with a meal. Start taking on:  09/03/2016   fluocinonide cream 0.05 % Commonly  known as:  LIDEX Apply 1 application topically 2 (two) times daily as needed (psoriasis).   gabapentin 300 MG capsule Commonly known as:  NEURONTIN Take 300-600 mg by mouth See admin instructions. Take 1 capsule (300 mg) by mouth every morning and 2 capsules (600 mg) at night   Insulin Glargine 100 UNIT/ML Solostar Pen Commonly known as:  LANTUS SOLOSTAR Inject 30 Units into the skin at bedtime. Titrate up Insulin dose at the time of Follow up with PCP What changed:  how much to take  additional instructions   levofloxacin 500 MG tablet Commonly known as:  LEVAQUIN Take 1 tablet (500 mg total) by mouth every other day. For 4days Start taking on:  08/29/2016   linagliptin 5 MG Tabs tablet Commonly known as:  TRADJENTA Take 5 mg by mouth daily.   linezolid 600 MG tablet Commonly known as:  ZYVOX Take 1 tablet (600 mg total) by mouth every 12 (twelve) hours. For 4days   loratadine 10 MG tablet Commonly known as:  CLARITIN Take 10 mg by mouth daily.   oxyCODONE-acetaminophen 5-325 MG tablet Commonly known as:  PERCOCET/ROXICET Take 1-2 tablets by mouth every 6 (six) hours as needed for moderate pain or severe pain.   senna-docusate 8.6-50 MG tablet Commonly known as:  Senokot-S Take 1 tablet by mouth at bedtime as needed for mild constipation.   simvastatin 5 MG tablet Commonly  known as:  ZOCOR Take 5 mg by mouth at bedtime.   traMADol 50 MG tablet Commonly known as:  ULTRAM Take 50 mg by mouth 2 (two) times daily.   vitamin C 500 MG tablet Commonly known as:  ASCORBIC ACID Take 500 mg by mouth daily.            Durable Medical Equipment        Start     Ordered   08/28/16 1104  For home use only DME 3 n 1  Once     08/28/16 1103

## 2016-08-28 NOTE — Progress Notes (Signed)
D/c PICC per order.  Verbalizes understanding  Of D/C instructions, when to call doctor and signs and interventions for infection and bleeding- via teach back method.  Site cleaned with CHG swab, covered with vaseline guaze and dry 2x2.  Site without signs of infection present.

## 2016-08-28 NOTE — Care Management Note (Signed)
Case Management Note Marvetta Gibbons RN, BSN Unit 2W-Case Manager [phone] Covering [phone]  Patient Details  Name: Angela Castro MRN: [id number] Date of Birth: December 03, 1940  Subjective/Objective:  Pt admitted with osteomyelitis, s/p 1st ray amputation on 4/17                Action/Plan: PTA pt lived at home with spouse- was active with [facility] for home IV abx- pt will be going home with po abx this time- order placed for HHPT- spoke with pt and spouse at bedside- choice offered for HHPT- pt and spouse would like to use [facility] for therapy -if unavailable for services then are fine with continued service with [facility]- pt will also need 3n1 for home- referral called to [facility]- referral has been accepted for [place] with [facility] notified of pt's choice for different agency for [facility] services also notified for DME need- 3n1 to be delivered to room prior to discharge.   Expected Discharge Date:  08/28/16               Expected Discharge Plan:  [place]  In-House Referral:     Discharge planning Services  CM Consult  Post Acute Care Choice:  Home Health, Durable Medical Equipment Choice offered to:  Patient, Spouse  DME Arranged:  3-N-1 DME Agency:  [place]:  PT [place]:  [facility])  Status of Service:  Completed, signed off  If discussed at [employer] of Stay Meetings, dates discussed:    Discharge Disposition: Home with Home Health   Additional Comments:  Dawayne Patricia, RN 08/28/2016, 11:09 AM

## 2016-08-28 NOTE — Care Management Important Message (Signed)
Important Message  Patient Details  Name: Angela Castro MRN: [id number] Date of Birth: 1940-06-03   Medicare Important Message Given:  Yes    Nathen May 08/28/2016, 12:27 PM

## 2016-08-28 NOTE — Progress Notes (Signed)
Subjective: 2 Days Post-Op Procedure(s) (LRB): AMPUTATION RAY/1ST (Left)  Patient reports pain as mild.  Tolerating POs well.  Denies fever, chills, N/V, SOB, CP.  Reports that she worked well with therapy yesterday and is ready to go home.  Objective:   VITALS:  Temp:  [97.8 F (36.6 C)-98.3 F (36.8 C)] 97.8 F (36.6 C) (04/19 0446) Pulse Rate:  [70-77] 75 (04/19 0446) Resp:  [16-20] 16 (04/19 0446) BP: (117-129)/(48-51) 129/51 (04/19 0446) SpO2:  [93 %-98 %] 93 % (04/19 0446)  General: WDWN patient in NAD. Psych:  Appropriate mood and affect. Neuro:  A&O x 3, Moving all extremities, sensation diminished to light touch due to baseline P.N. HEENT:  EOMs intact Chest:  Even non-labored respirations Skin:  Dressing C/D/I, no rashes or lesions Extremities: warm/dry, no visible edema, erythema or echymosis.  No lymphadenopathy. Pulses: Popliteus 2+ MSK:  ROM: full ankle ROM, MMT: patient can perform quad set, (-) Homan's    LABS  Recent Labs  08/26/16 0531 08/27/16 0601 08/28/16 0421  HGB 8.5* 7.7* 8.1*  WBC 4.4 4.7 3.7*  PLT 198 186 220    Recent Labs  08/27/16 0601 08/28/16 0421  NA 143 143  K 4.3 4.2  CL 115* 114*  CO2 22 22  BUN 15 17  CREATININE 1.64* 1.62*  GLUCOSE 133* 181*   No results for input(s): LABPT, INR in the last 72 hours.   Assessment/Plan: 2 Days Post-Op Procedure(s) (LRB): AMPUTATION RAY/1ST (Left)  WBAT LLE in flat post-op shoe Reinforce dressing only, do not remove. Plan for 2 week outpatient post-op visit with Dr. Doran Durand Ortho signing off.   Please contact with any concerns.  Mechele Claude, PA-C [facility] Office:  [phone]

## 2016-08-28 NOTE — Discharge Summary (Signed)
Physician Discharge Summary  Angela Castro NLZ:[id number] DOB: Apr 03, 1941 DOA: 08/24/2016  PCP: Pcp Not In System  Admit date: 08/24/2016 Discharge date: 08/28/2016  Time spent: 35 minutes  Recommendations for Outpatient Follow-up:  [place] PT/RN PCP in 1 week  Discharge Diagnoses:  Principal Problem:   Osteomyelitis ([place])   Diarrhea   Weakness   Diabetes mellitus type 2 in obese [facility])   Acute-on-chronic renal failure 3   CKD 3   Iron deficiency anemia   Discharge Condition: stable  Diet recommendation: DM  Filed Weights   08/24/16 1205  Weight: 75.3 kg (166 lb)    History of present illness:  76 year old female with a history of hypertension, diabetes, nonhealing left foot ulcer, followed by infectious disease, status post receiving teflaro.Patient had been on Teflaro since 3/20 for wound treatment. Patient subsequently developed a rash secondary to Teflaro .She presented 4/15 with fever and wound infection  Hospital Course:   Osteomyelitis L foot, hallux and 1st MT -Ortho and ID consulted -she had been on Teflaro prior to admission per ID  -Then started on Daptomycin this admission, developed severe diarrhea from this and hence abx changed to Levaquin and Zyvox, she underwent 1st ray amputation by Dr.Hewitt 4/17 -ABI unremarkable -discharged home in a stable condition,  With ORtho follow up  Diarrhea -abx associated -cdiff PCR negative -improved, received imodium in hospital  LUE swelling -completely resolved -due to BP cuff in arm all day 4/16-4/17 and FLuid resusucitation  Diabetes -resumed lantus at lower dose, resumed tradjenta Hemoglobin A1c 7.2 -will need dose titration as outpatient  Hypertension -stable, stopped cozaar due to AKI -continue zebeta  AKI, in the setting of chronic kidney disease stage III -Baseline Cr 2.24, Cr on admit 3.75, now 2.68>1.95  after IV fluids -improved with hydration, ARB stopped -stable now,  creatinine on discharge is 1.6  Asthma  -stable, continue Prn albuterol  Gout Cont allopurinol  Drug Rash,  -secondary to teflaro, improved  Discharge Exam: Vitals:   08/27/16 2100 08/28/16 0446  BP: (!) 129/49 (!) 129/51  Pulse: 70 75  Resp: 16 16  Temp: 97.8 F (36.6 C) 97.8 F (36.6 C)    General: AAOx3 Cardiovascular: S1S2/RRR Respiratory: CTAB  Discharge Instructions   Discharge Instructions    Diet - low sodium heart healthy    Complete by:  As directed    Diet Carb Modified    Complete by:  As directed    Increase activity slowly    Complete by:  As directed      Current Discharge Medication List    START taking these medications   Details  ferrous sulfate (FERROUSUL) 325 (65 FE) MG tablet Take 1 tablet (325 mg total) by mouth 2 (two) times daily with a meal. Qty: 60 tablet, Refills: 1    levofloxacin (LEVAQUIN) 500 MG tablet Take 1 tablet (500 mg total) by mouth every other day. For 4days Qty: 2 tablet, Refills: 0    linezolid (ZYVOX) 600 MG tablet Take 1 tablet (600 mg total) by mouth every 12 (twelve) hours. For 4days Qty: 8 tablet, Refills: 0    oxyCODONE-acetaminophen (PERCOCET/ROXICET) 5-325 MG tablet Take 1-2 tablets by mouth every 6 (six) hours as needed for moderate pain or severe pain. Qty: 30 tablet, Refills: 0    senna-docusate (SENOKOT-S) 8.6-50 MG tablet Take 1 tablet by mouth at bedtime as needed for mild constipation. Qty: 5 tablet, Refills: 0      CONTINUE these medications which  have CHANGED   Details  Insulin Glargine (LANTUS SOLOSTAR) 100 UNIT/ML Solostar Pen Inject 30 Units into the skin at bedtime. Titrate up Insulin dose at the time of Follow up with PCP      CONTINUE these medications which have NOT CHANGED   Details  allopurinol (ZYLOPRIM) 100 MG tablet Take 100 mg by mouth 2 (two) times daily.     amLODipine (NORVASC) 5 MG tablet Take 5 mg by mouth daily.    bisoprolol (ZEBETA) 10 MG tablet Take 10 mg by mouth  daily.    Calcium Carbonate-Vitamin D (CALCIUM-D) 600-400 MG-UNIT TABS Take 1 tablet by mouth daily.    clobetasol (TEMOVATE) 0.05 % GEL Apply 1 application topically daily as needed (psoriasis).     fluocinonide cream (LIDEX) 4.01 % Apply 1 application topically 2 (two) times daily as needed (psoriasis).     gabapentin (NEURONTIN) 300 MG capsule Take 300-600 mg by mouth See admin instructions. Take 1 capsule (300 mg) by mouth every morning and 2 capsules (600 mg) at night    linagliptin (TRADJENTA) 5 MG TABS tablet Take 5 mg by mouth daily.    loratadine (CLARITIN) 10 MG tablet Take 10 mg by mouth daily.    simvastatin (ZOCOR) 5 MG tablet Take 5 mg by mouth at bedtime.     traMADol (ULTRAM) 50 MG tablet Take 50 mg by mouth 2 (two) times daily.     vitamin C (ASCORBIC ACID) 500 MG tablet Take 500 mg by mouth daily.    aspirin EC 81 MG tablet Take 81 mg by mouth daily.      STOP taking these medications     losartan (COZAAR) 100 MG tablet        Allergies  Allergen Reactions  . Sulfa Antibiotics Shortness Of Breath and Rash  . Adhesive [Tape] Other (See Comments)    Causes redness, please use paper tape  . Keflex [Cephalexin] Other (See Comments)    Possibly caused lethargy (taken with Flagyl)  . Metronidazole Other (See Comments)    Possibly caused lethargy (taken with keflex)  . Teflaro [Ceftaroline] Other (See Comments)    Lethargy, possibly rash   Follow-up Information    HEWITT, JOHN, MD. Schedule an appointment as soon as possible for a visit on 09/11/2016.   Specialty:  Orthopedic Surgery Why:  Appointment is on 09/11/16 at 2:30 please be there at 2pm for for paperwork Contact information: [address] [place] [address] [phone]        [facility].   Specialty:  [place] Why:  HHPT arranged- services to start within 24-48 hr post discharge- they will call you to set up home visits Contact information: [address] [place] [address] [phone]        Inc[facility] - Dme [facility].   Why:  3n1 arranged- to be delivered to room prior to discharge Contact information: [address] [place] [address] [phone]            The results of significant diagnostics from this hospitalization (including imaging, microbiology, ancillary and laboratory) are listed below for reference.    Significant Diagnostic Studies: Dg Chest 2 View  Result Date: 08/24/2016 CLINICAL DATA:  Fever EXAM: CHEST  2 VIEW COMPARISON:  07/30/2016 FINDINGS: Cardiomediastinal silhouette is stable. Mild streaky left basilar atelectasis or scarring. No infiltrate or pleural effusion. There is right arm PICC line with tip in SVC. IMPRESSION: No infiltrate or  pleural effusion. Mild streaky bilateral basilar atelectasis or scarring. Right arm PICC line with tip in SVC. Electronically Signed   By: Lahoma Crocker M.D.   On: 08/24/2016 14:16   Ct Head Wo Contrast  Result Date: 08/24/2016 CLINICAL DATA:  76 year old female with altered mental status. EXAM: CT HEAD WITHOUT CONTRAST TECHNIQUE: Contiguous axial images were obtained from the base of the skull through the vertex without intravenous contrast. COMPARISON:  None. FINDINGS: Brain: No evidence of acute infarction, hemorrhage, hydrocephalus, extra-axial collection or mass lesion/mass effect. Mild generalized cerebral volume loss noted. Vascular: No hyperdense vessel or unexpected calcification. Skull: Normal. Negative for fracture or focal lesion. Sinuses/Orbits: No acute finding. Other: None. IMPRESSION: No evidence of acute intracranial abnormality. Electronically Signed   By: Margarette Canada M.D.   On: 08/24/2016 14:22   Ir US Guide Vasc Access Right  Result Date: 07/30/2016 INDICATION: 76 year old with left foot ulcer and possible osteomyelitis. PICC line needed for antibiotics. EXAM: RIGHT ARM PICC LINE PLACEMENT WITH ULTRASOUND AND FLUOROSCOPIC GUIDANCE  MEDICATIONS: None ANESTHESIA/SEDATION: None FLUOROSCOPY TIME:  Fluoroscopy Time: 18 seconds, 0.6 mGy COMPLICATIONS: None immediate. PROCEDURE: The patient was advised of the possible risks and complications and agreed to undergo the procedure. The patient was then brought to the angiographic suite for the procedure. The right arm was prepped with chlorhexidine, draped in the usual sterile fashion using maximum barrier technique (cap and mask, sterile gown, sterile gloves, large sterile sheet, hand hygiene and cutaneous antisepsis) and infiltrated locally with 1% Lidocaine. Ultrasound demonstrated patency of the right basilic vein, and this was documented with an image. Under real-time ultrasound guidance, this vein was accessed with a 21 gauge micropuncture needle and image documentation was performed. A 0.018 wire was introduced in to the vein. Over this, a 5 [place] single lumen power injectable PICC was advanced to the lower SVC/right atrial junction. Fluoroscopy during the procedure and fluoro spot radiograph confirms appropriate catheter position. The catheter was flushed and covered with a sterile dressing. Catheter length: 37 cm IMPRESSION: Successful right arm Power PICC line placement with ultrasound and fluoroscopic guidance. The catheter is ready for use. Electronically Signed   By: Markus Daft M.D.   On: 07/30/2016 09:47   Dg Toe Great Left  Result Date: 08/24/2016 CLINICAL DATA:  Open wound of the proximal great toe. EXAM: LEFT GREAT TOE COMPARISON:  06/12/2016 radiographs and 07/02/2016 MR FINDINGS: Fragmentation and osteolysis of the first metatarsal head is compatible with osteomyelitis. There is apparent cortical loss at the base of the great toe proximal phalanx worrisome for acute osteomyelitis as well. No soft tissue gas is identified. IMPRESSION: Osteomyelitis of the first metatarsal head and base of the great toe proximal phalanx. Electronically Signed   By: Margarette Canada M.D.   On: 08/24/2016  14:18   Ir Fluoro Guide Cv Midline Picc Right  Result Date: 07/30/2016 INDICATION: 76 year old with left foot ulcer and possible osteomyelitis. PICC line needed for antibiotics. EXAM: RIGHT ARM PICC LINE PLACEMENT WITH ULTRASOUND AND FLUOROSCOPIC GUIDANCE MEDICATIONS: None ANESTHESIA/SEDATION: None FLUOROSCOPY TIME:  Fluoroscopy Time: 18 seconds, 0.6 mGy COMPLICATIONS: None immediate. PROCEDURE: The patient was advised of the possible risks and complications and agreed to undergo the procedure. The patient was then brought to the angiographic suite for the procedure. The right arm was prepped with chlorhexidine, draped in the usual sterile fashion using maximum barrier technique (cap and mask, sterile gown, sterile gloves, large sterile sheet, hand hygiene and cutaneous antisepsis) and infiltrated locally with 1%  Lidocaine. Ultrasound demonstrated patency of the right basilic vein, and this was documented with an image. Under real-time ultrasound guidance, this vein was accessed with a 21 gauge micropuncture needle and image documentation was performed. A 0.018 wire was introduced in to the vein. Over this, a 5 [place] single lumen power injectable PICC was advanced to the lower SVC/right atrial junction. Fluoroscopy during the procedure and fluoro spot radiograph confirms appropriate catheter position. The catheter was flushed and covered with a sterile dressing. Catheter length: 37 cm IMPRESSION: Successful right arm Power PICC line placement with ultrasound and fluoroscopic guidance. The catheter is ready for use. Electronically Signed   By: Markus Daft M.D.   On: 07/30/2016 09:47    Microbiology: Recent Results (from the past 240 hour(s))  Culture, blood (Routine x 2)     Status: None (Preliminary result)   Collection Time: 08/24/16 12:20 PM  Result Value Ref Range Status   Specimen Description BLOOD LEFT ANTECUBITAL  Final   Special Requests   Final    BOTTLES DRAWN AEROBIC AND ANAEROBIC Blood  Culture adequate volume   Culture NO GROWTH 4 DAYS  Final   Report Status PENDING  Incomplete  C difficile quick scan w PCR reflex     Status: None   Collection Time: 08/25/16  4:34 PM  Result Value Ref Range Status   C Diff antigen NEGATIVE NEGATIVE Final   C Diff toxin NEGATIVE NEGATIVE Final   C Diff interpretation No C. difficile detected.  Final  Surgical pcr screen     Status: None   Collection Time: 08/25/16  6:45 PM  Result Value Ref Range Status   MRSA, PCR NEGATIVE NEGATIVE Final   Staphylococcus aureus NEGATIVE NEGATIVE Final    Comment:        The Xpert SA Assay (FDA approved for NASAL specimens in patients over 75 years of age), is one component of a comprehensive surveillance program.  Test performance has been validated by [facility] for patients greater than or equal to 53 year old. It is not intended to diagnose infection nor to guide or monitor treatment.      Labs: Basic Metabolic Panel:  Recent Labs Lab 08/24/16 1218 08/24/16 1702 08/25/16 0644 08/26/16 0531 08/27/16 0601 08/28/16 0421  NA 142  --  143 146* 143 143  K 4.6  --  4.9 4.3 4.3 4.2  CL 108  --  113* 117* 115* 114*  CO2 25  --  23 21* 22 22  GLUCOSE 110*  --  107* 108* 133* 181*  BUN 48*  --  37* 25* 15 17  CREATININE 3.75* 3.48* 2.68* 1.95* 1.64* 1.62*  CALCIUM 8.6*  --  8.3* 8.5* 8.3* 8.4*  MG  --  1.8  --   --   --   --   PHOS  --  4.4  --   --   --   --    Liver Function Tests:  Recent Labs Lab 08/24/16 1218 08/26/16 0531 08/27/16 0601  AST 30 20 26   ALT 23 19 21   ALKPHOS 65 56 59  BILITOT 0.6 0.6 0.4  PROT 5.7* 5.2* 4.9*  ALBUMIN 2.5* 2.2* 2.2*   No results for input(s): LIPASE, AMYLASE in the last 168 hours. No results for input(s): AMMONIA in the last 168 hours. CBC:  Recent Labs Lab 08/24/16 1218 08/24/16 1702 08/25/16 0430 08/26/16 0531 08/27/16 0601 08/28/16 0421  WBC 4.7 4.3 4.2 4.4 4.7 3.7*  NEUTROABS 2.2  --   --   --   --   --  HGB 9.6* 9.3*  9.0* 8.5* 7.7* 8.1*  HCT 31.0* 30.5* 29.2* 27.2* 24.9* 26.1*  MCV 89.6 88.9 89.3 86.9 87.4 87.6  PLT 180 175 110* 198 186 220   Cardiac Enzymes:  Recent Labs Lab 08/24/16 1702  CKTOTAL 97   BNP: BNP (last 3 results) No results for input(s): BNP in the last 8760 hours.  ProBNP (last 3 results) No results for input(s): PROBNP in the last 8760 hours.  CBG:  Recent Labs Lab 08/27/16 0754 08/27/16 1326 08/27/16 1724 08/27/16 2057 08/28/16 0823  GLUCAP 108* 162* 183* 188* 151*       SignedDomenic Polite MD.  [facility] 08/28/2016, 12:33 PM

## 2016-08-28 NOTE — Telephone Encounter (Signed)
Pharmacist from [facility] called to advise the patient was prescribed Zyvox 600 mg and it is no on the formulary and they would like a different medication.   Spoke with the pharmacist at [facility] and Dr Johnnye Sima who advised to change the order to Doxy 100 mg BID for 3 days. Will send to the pharmacy.

## 2016-08-30 LAB — CULTURE, BLOOD (ROUTINE X 2)
CULTURE: NO GROWTH
SPECIAL REQUESTS: ADEQUATE

## 2016-09-02 ENCOUNTER — Ambulatory Visit: Payer: Medicare Other | Admitting: Internal Medicine

## 2016-09-02 ENCOUNTER — Inpatient Hospital Stay (HOSPITAL_COMMUNITY): Admission: RE | Admit: 2016-09-02 | Payer: Medicare Other | Source: Ambulatory Visit

## 2017-01-01 NOTE — Addendum Note (Signed)
Addendum  created 01/01/17 1316 by Roberts Gaudy, MD   Sign clinical note

## 2017-07-14 IMAGING — CT CT HEAD W/O CM
4 series · 17 of 47 positions shown, 19 images · non-contrast
Comparison: None.

CLINICAL DATA: 76-year-old female with altered mental status.

EXAM:
CT HEAD WITHOUT CONTRAST
TECHNIQUE: Contiguous axial images were obtained from the base of the skull
through the vertex without intravenous contrast.

[Series 3: head without · axial · non-contrast · 0.41mm/px · z∈[+1224,+1334]mm · 7 of 30 slices shown, 9 images]
[im 4/30  brain]
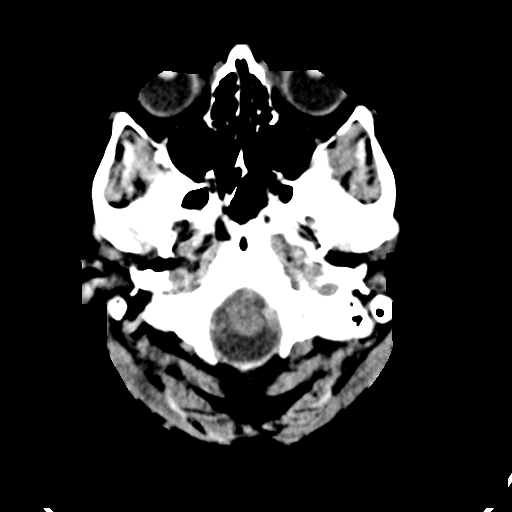
[im 4/30  bone]
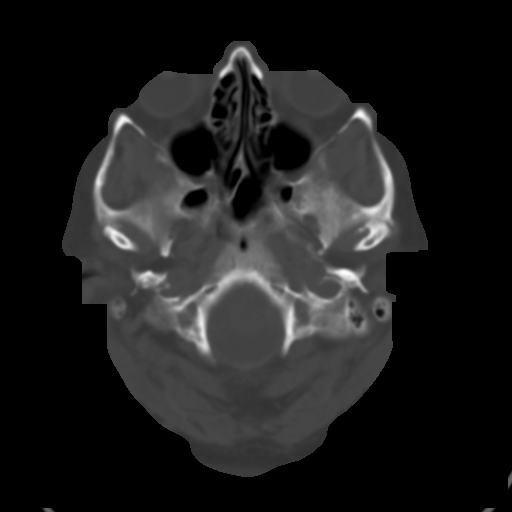
[im 8/30  brain]
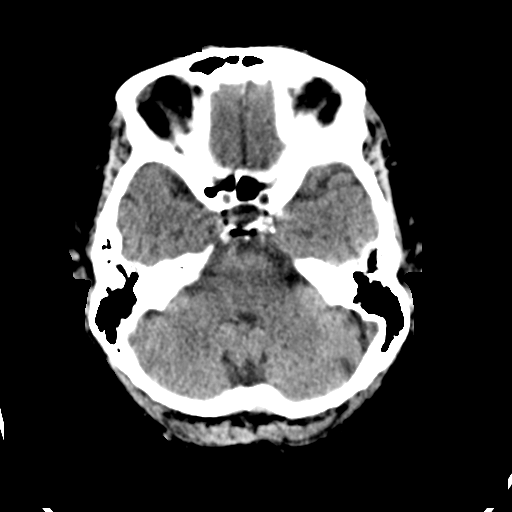
[im 11/30  brain]
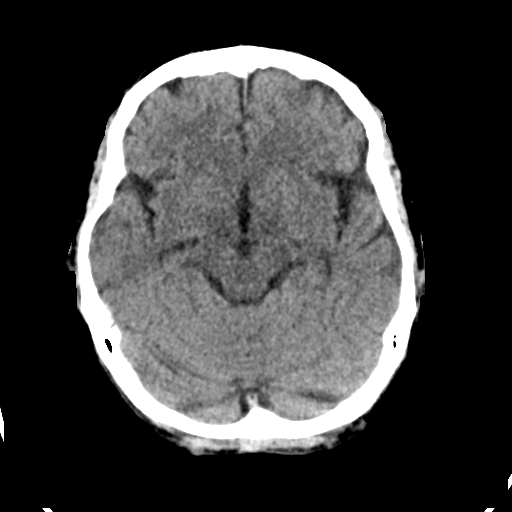
[im 15/30  brain]
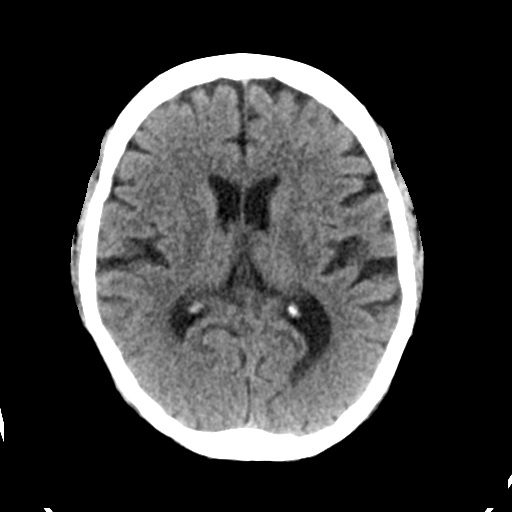
[im 19/30  brain]
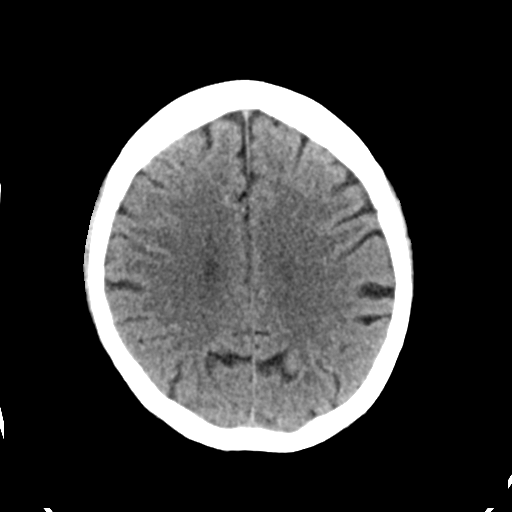
[im 19/30  bone]
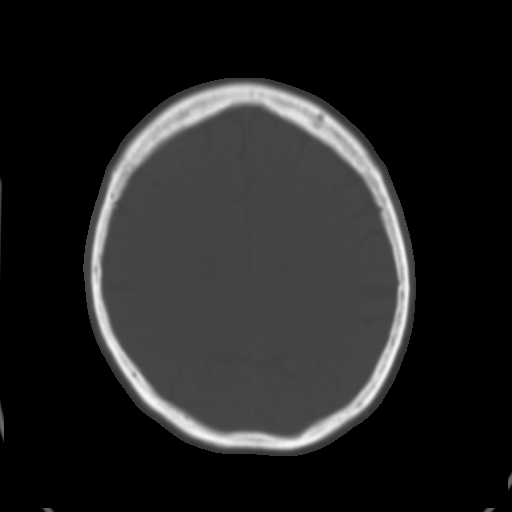
[im 22/30  brain]
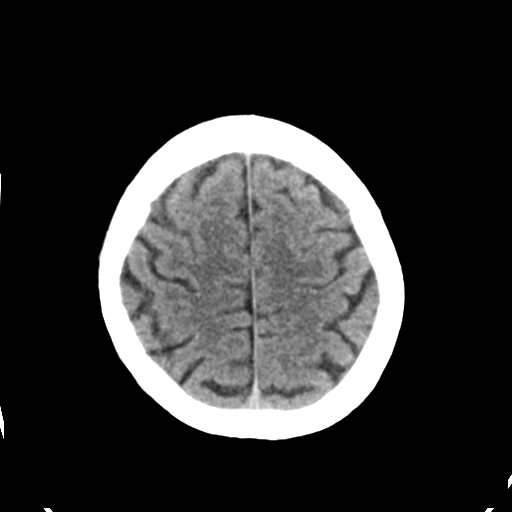
[im 26/30  brain]
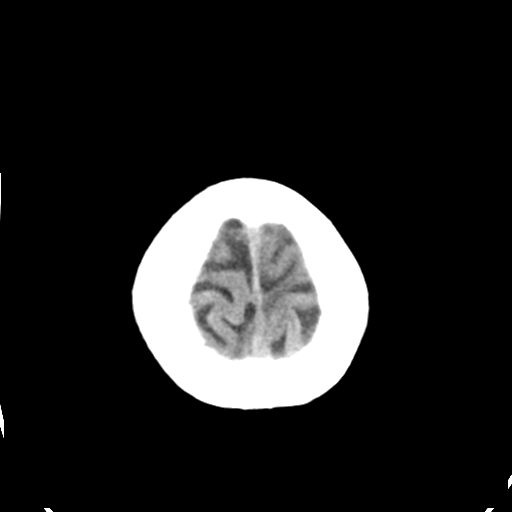

[Series 4: head bone · axial · 0.41mm/px · z∈[+1223,+1275]mm · 4 of 75 slices shown]
[im 8/75  bone]
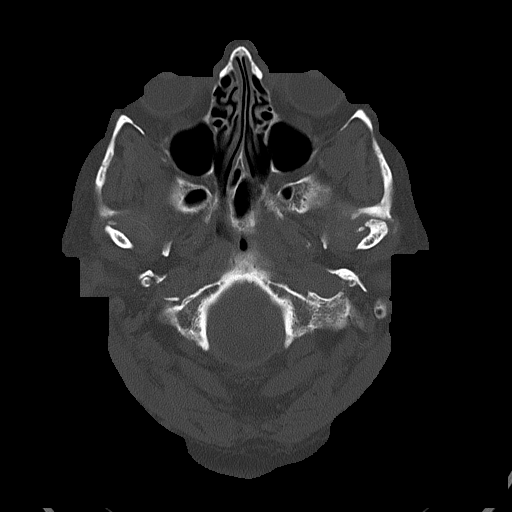
[im 15/75  bone]
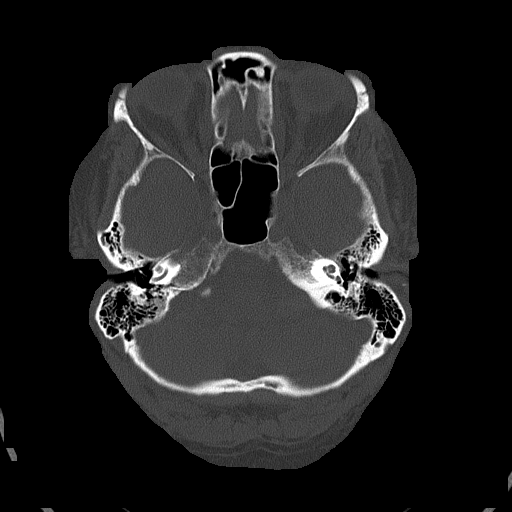
[im 23/75  bone]
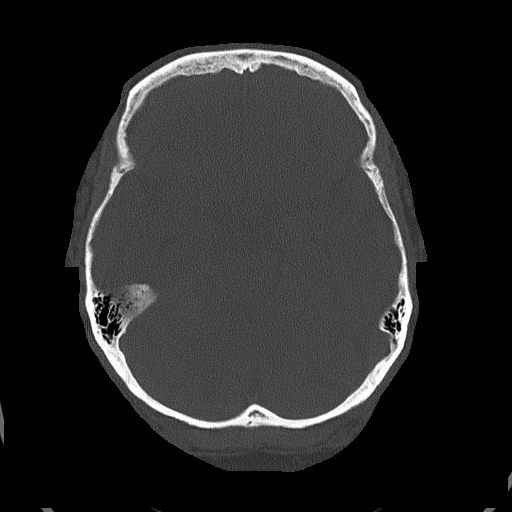
[im 34/75  bone]
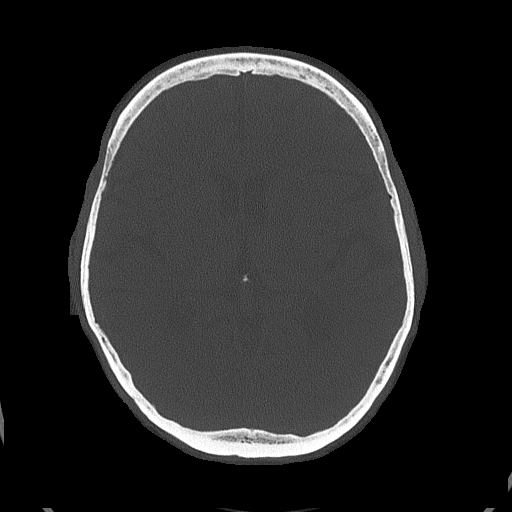

[Series 5: head without cor · coronal · non-contrast · 0.29mm/px · 3 of 52 slices shown]
[im 18/52  brain]
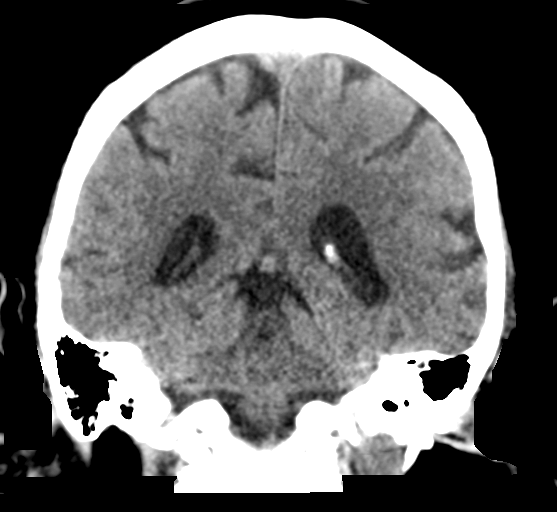
[im 23/52  brain]
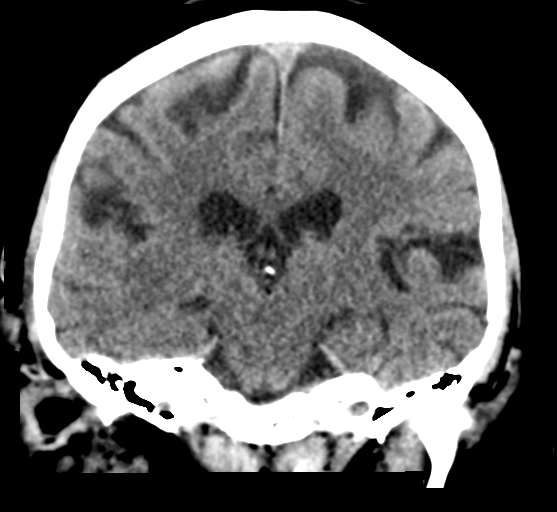
[im 29/52  brain]
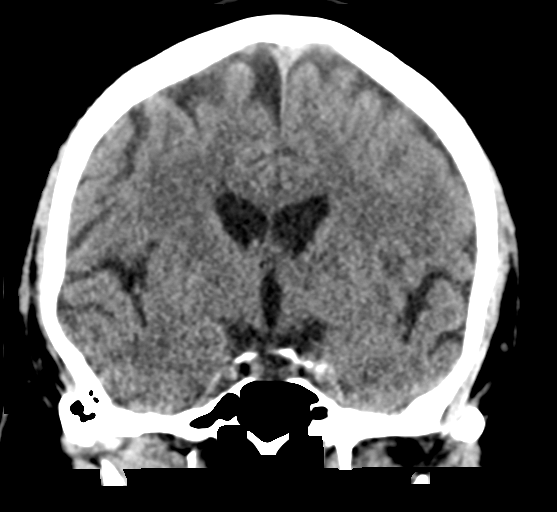

[Series 6: head without sag · sagittal · non-contrast · 0.29mm/px · 3 of 47 slices shown]
[im 16/47  brain]
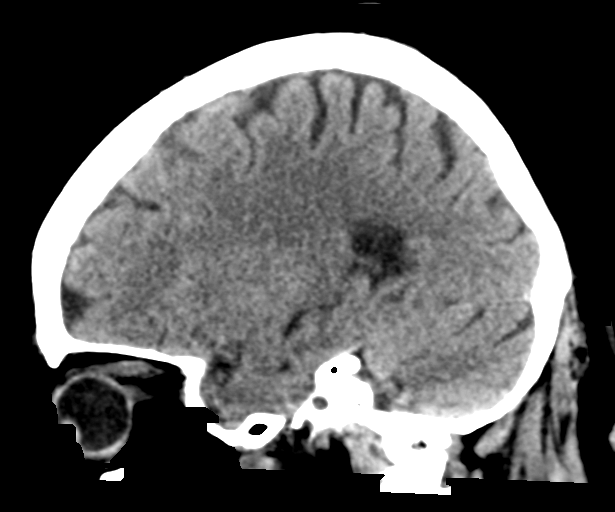
[im 24/47  brain]
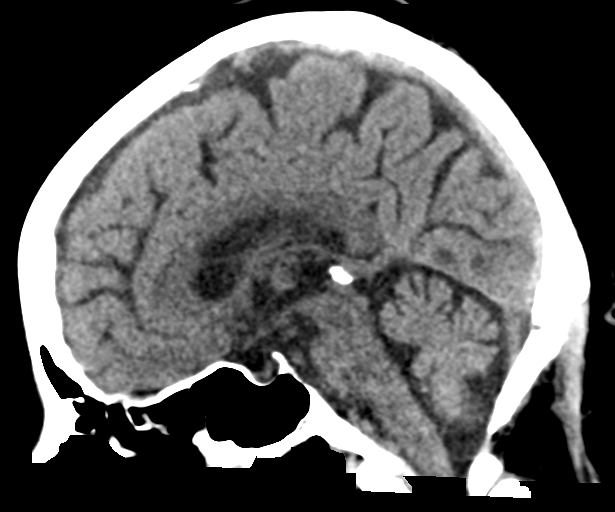
[im 31/47  brain]
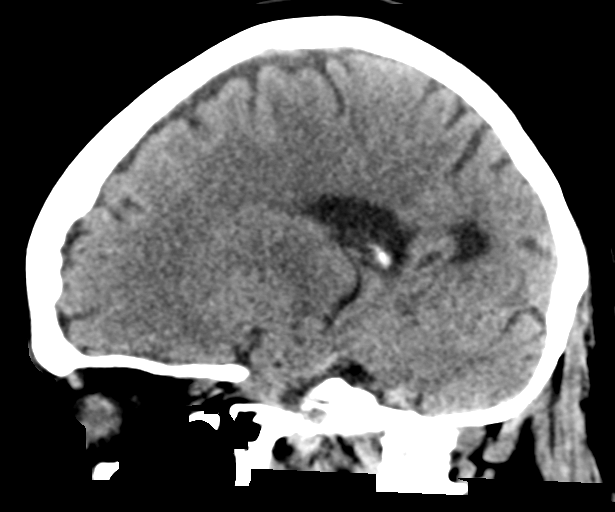

[17 of 47 positions shown; findings below may reference images not displayed]

FINDINGS: Brain: No evidence of acute infarction, hemorrhage, hydrocephalus,
extra-axial collection or mass lesion/mass effect. Mild generalized
cerebral volume loss noted.

Vascular: No hyperdense vessel or unexpected calcification.

Skull: Normal. Negative for fracture or focal lesion.

Sinuses/Orbits: No acute finding.

Other: None.
IMPRESSION: No evidence of acute intracranial abnormality.

## 2019-03-17 ENCOUNTER — Other Ambulatory Visit: Payer: Self-pay

## 2019-03-17 DIAGNOSIS — Z20822 Contact with and (suspected) exposure to covid-19: Secondary | ICD-10-CM

## 2019-03-18 LAB — NOVEL CORONAVIRUS, NAA: SARS-CoV-2, NAA: NOT DETECTED

## 2019-12-20 DIAGNOSIS — E1140 Type 2 diabetes mellitus with diabetic neuropathy, unspecified: Secondary | ICD-10-CM | POA: Diagnosis not present

## 2019-12-20 DIAGNOSIS — S99929A Unspecified injury of unspecified foot, initial encounter: Secondary | ICD-10-CM | POA: Diagnosis not present

## 2019-12-20 DIAGNOSIS — R609 Edema, unspecified: Secondary | ICD-10-CM | POA: Diagnosis not present

## 2019-12-20 DIAGNOSIS — Z89422 Acquired absence of other left toe(s): Secondary | ICD-10-CM | POA: Diagnosis not present

## 2019-12-20 DIAGNOSIS — E785 Hyperlipidemia, unspecified: Secondary | ICD-10-CM | POA: Diagnosis not present

## 2019-12-22 DIAGNOSIS — E785 Hyperlipidemia, unspecified: Secondary | ICD-10-CM | POA: Diagnosis not present

## 2019-12-22 DIAGNOSIS — E1140 Type 2 diabetes mellitus with diabetic neuropathy, unspecified: Secondary | ICD-10-CM | POA: Diagnosis not present

## 2020-02-14 DIAGNOSIS — H40023 Open angle with borderline findings, high risk, bilateral: Secondary | ICD-10-CM | POA: Diagnosis not present

## 2020-02-14 DIAGNOSIS — H25013 Cortical age-related cataract, bilateral: Secondary | ICD-10-CM | POA: Diagnosis not present

## 2020-02-14 DIAGNOSIS — H25043 Posterior subcapsular polar age-related cataract, bilateral: Secondary | ICD-10-CM | POA: Diagnosis not present

## 2020-02-14 DIAGNOSIS — H2513 Age-related nuclear cataract, bilateral: Secondary | ICD-10-CM | POA: Diagnosis not present

## 2020-02-14 DIAGNOSIS — E113293 Type 2 diabetes mellitus with mild nonproliferative diabetic retinopathy without macular edema, bilateral: Secondary | ICD-10-CM | POA: Diagnosis not present

## 2020-02-14 DIAGNOSIS — H2511 Age-related nuclear cataract, right eye: Secondary | ICD-10-CM | POA: Diagnosis not present

## 2020-02-16 DIAGNOSIS — E1140 Type 2 diabetes mellitus with diabetic neuropathy, unspecified: Secondary | ICD-10-CM | POA: Diagnosis not present

## 2020-02-16 DIAGNOSIS — I10 Essential (primary) hypertension: Secondary | ICD-10-CM | POA: Diagnosis not present

## 2020-02-16 DIAGNOSIS — E785 Hyperlipidemia, unspecified: Secondary | ICD-10-CM | POA: Diagnosis not present

## 2020-02-16 DIAGNOSIS — Z0000 Encounter for general adult medical examination without abnormal findings: Secondary | ICD-10-CM | POA: Diagnosis not present

## 2020-02-16 DIAGNOSIS — Z23 Encounter for immunization: Secondary | ICD-10-CM | POA: Diagnosis not present

## 2020-02-16 DIAGNOSIS — Z1239 Encounter for other screening for malignant neoplasm of breast: Secondary | ICD-10-CM | POA: Diagnosis not present

## 2020-02-16 DIAGNOSIS — N1832 Chronic kidney disease, stage 3b: Secondary | ICD-10-CM | POA: Diagnosis not present

## 2020-02-16 DIAGNOSIS — D649 Anemia, unspecified: Secondary | ICD-10-CM | POA: Diagnosis not present

## 2020-02-16 DIAGNOSIS — E1165 Type 2 diabetes mellitus with hyperglycemia: Secondary | ICD-10-CM | POA: Diagnosis not present

## 2020-02-20 ENCOUNTER — Other Ambulatory Visit: Payer: Self-pay | Admitting: Family Medicine

## 2020-02-20 DIAGNOSIS — Z1231 Encounter for screening mammogram for malignant neoplasm of breast: Secondary | ICD-10-CM

## 2020-03-21 DIAGNOSIS — H25811 Combined forms of age-related cataract, right eye: Secondary | ICD-10-CM | POA: Diagnosis not present

## 2020-03-21 DIAGNOSIS — H2511 Age-related nuclear cataract, right eye: Secondary | ICD-10-CM | POA: Diagnosis not present

## 2020-04-10 DIAGNOSIS — D649 Anemia, unspecified: Secondary | ICD-10-CM | POA: Diagnosis not present

## 2020-04-10 DIAGNOSIS — E1165 Type 2 diabetes mellitus with hyperglycemia: Secondary | ICD-10-CM | POA: Diagnosis not present

## 2020-04-10 DIAGNOSIS — Z0000 Encounter for general adult medical examination without abnormal findings: Secondary | ICD-10-CM | POA: Diagnosis not present

## 2020-04-10 DIAGNOSIS — E785 Hyperlipidemia, unspecified: Secondary | ICD-10-CM | POA: Diagnosis not present

## 2020-06-19 ENCOUNTER — Ambulatory Visit
Admission: RE | Admit: 2020-06-19 | Discharge: 2020-06-19 | Disposition: A | Discharge status: Home / Self Care | Payer: Medicare PPO | Source: Ambulatory Visit | Attending: Family Medicine | Admitting: Family Medicine

## 2020-06-19 ENCOUNTER — Other Ambulatory Visit: Payer: Self-pay | Admitting: Family Medicine

## 2020-06-19 DIAGNOSIS — W19XXXA Unspecified fall, initial encounter: Secondary | ICD-10-CM

## 2020-06-19 DIAGNOSIS — M7989 Other specified soft tissue disorders: Secondary | ICD-10-CM | POA: Diagnosis not present

## 2020-06-19 DIAGNOSIS — S60222A Contusion of left hand, initial encounter: Secondary | ICD-10-CM | POA: Diagnosis not present

## 2020-06-19 DIAGNOSIS — M19042 Primary osteoarthritis, left hand: Secondary | ICD-10-CM | POA: Diagnosis not present

## 2020-07-05 DIAGNOSIS — E785 Hyperlipidemia, unspecified: Secondary | ICD-10-CM | POA: Diagnosis not present

## 2020-07-05 DIAGNOSIS — M7989 Other specified soft tissue disorders: Secondary | ICD-10-CM | POA: Diagnosis not present

## 2020-07-05 DIAGNOSIS — E1165 Type 2 diabetes mellitus with hyperglycemia: Secondary | ICD-10-CM | POA: Diagnosis not present

## 2020-07-05 DIAGNOSIS — I10 Essential (primary) hypertension: Secondary | ICD-10-CM | POA: Diagnosis not present

## 2020-08-06 DIAGNOSIS — E1165 Type 2 diabetes mellitus with hyperglycemia: Secondary | ICD-10-CM | POA: Diagnosis not present

## 2020-08-06 DIAGNOSIS — M7989 Other specified soft tissue disorders: Secondary | ICD-10-CM | POA: Diagnosis not present

## 2020-08-06 DIAGNOSIS — E785 Hyperlipidemia, unspecified: Secondary | ICD-10-CM | POA: Diagnosis not present

## 2020-09-17 DIAGNOSIS — R609 Edema, unspecified: Secondary | ICD-10-CM | POA: Diagnosis not present

## 2020-09-17 DIAGNOSIS — S80212A Abrasion, left knee, initial encounter: Secondary | ICD-10-CM | POA: Diagnosis not present

## 2020-09-17 DIAGNOSIS — M79642 Pain in left hand: Secondary | ICD-10-CM | POA: Diagnosis not present

## 2020-09-17 DIAGNOSIS — Z89422 Acquired absence of other left toe(s): Secondary | ICD-10-CM | POA: Diagnosis not present

## 2020-09-17 DIAGNOSIS — E1140 Type 2 diabetes mellitus with diabetic neuropathy, unspecified: Secondary | ICD-10-CM | POA: Diagnosis not present

## 2020-09-17 DIAGNOSIS — E785 Hyperlipidemia, unspecified: Secondary | ICD-10-CM | POA: Diagnosis not present

## 2020-10-23 DIAGNOSIS — E1165 Type 2 diabetes mellitus with hyperglycemia: Secondary | ICD-10-CM | POA: Diagnosis not present

## 2020-12-21 DIAGNOSIS — I10 Essential (primary) hypertension: Secondary | ICD-10-CM | POA: Diagnosis not present

## 2020-12-21 DIAGNOSIS — L0390 Cellulitis, unspecified: Secondary | ICD-10-CM | POA: Diagnosis not present

## 2020-12-24 DIAGNOSIS — R200 Anesthesia of skin: Secondary | ICD-10-CM | POA: Diagnosis not present

## 2020-12-24 DIAGNOSIS — M25642 Stiffness of left hand, not elsewhere classified: Secondary | ICD-10-CM | POA: Diagnosis not present

## 2020-12-24 DIAGNOSIS — M19039 Primary osteoarthritis, unspecified wrist: Secondary | ICD-10-CM | POA: Diagnosis not present

## 2020-12-24 DIAGNOSIS — M19032 Primary osteoarthritis, left wrist: Secondary | ICD-10-CM | POA: Diagnosis not present

## 2020-12-24 DIAGNOSIS — M1812 Unilateral primary osteoarthritis of first carpometacarpal joint, left hand: Secondary | ICD-10-CM | POA: Diagnosis not present

## 2020-12-27 DIAGNOSIS — Z7984 Long term (current) use of oral hypoglycemic drugs: Secondary | ICD-10-CM | POA: Diagnosis not present

## 2020-12-27 DIAGNOSIS — E1165 Type 2 diabetes mellitus with hyperglycemia: Secondary | ICD-10-CM | POA: Diagnosis not present

## 2020-12-27 DIAGNOSIS — R609 Edema, unspecified: Secondary | ICD-10-CM | POA: Diagnosis not present

## 2020-12-27 DIAGNOSIS — L0390 Cellulitis, unspecified: Secondary | ICD-10-CM | POA: Diagnosis not present

## 2021-01-01 DIAGNOSIS — H25012 Cortical age-related cataract, left eye: Secondary | ICD-10-CM | POA: Diagnosis not present

## 2021-01-01 DIAGNOSIS — H40023 Open angle with borderline findings, high risk, bilateral: Secondary | ICD-10-CM | POA: Diagnosis not present

## 2021-01-01 DIAGNOSIS — H2512 Age-related nuclear cataract, left eye: Secondary | ICD-10-CM | POA: Diagnosis not present

## 2021-01-01 DIAGNOSIS — H25042 Posterior subcapsular polar age-related cataract, left eye: Secondary | ICD-10-CM | POA: Diagnosis not present

## 2021-01-04 DIAGNOSIS — S61209A Unspecified open wound of unspecified finger without damage to nail, initial encounter: Secondary | ICD-10-CM | POA: Diagnosis not present

## 2021-01-04 DIAGNOSIS — L03012 Cellulitis of left finger: Secondary | ICD-10-CM | POA: Diagnosis not present

## 2021-01-04 DIAGNOSIS — E1165 Type 2 diabetes mellitus with hyperglycemia: Secondary | ICD-10-CM | POA: Diagnosis not present

## 2021-01-04 DIAGNOSIS — S61209D Unspecified open wound of unspecified finger without damage to nail, subsequent encounter: Secondary | ICD-10-CM | POA: Diagnosis not present

## 2021-01-04 DIAGNOSIS — L0390 Cellulitis, unspecified: Secondary | ICD-10-CM | POA: Diagnosis not present

## 2021-01-04 DIAGNOSIS — L03119 Cellulitis of unspecified part of limb: Secondary | ICD-10-CM | POA: Diagnosis not present

## 2021-01-09 DIAGNOSIS — H25012 Cortical age-related cataract, left eye: Secondary | ICD-10-CM | POA: Diagnosis not present

## 2021-01-09 DIAGNOSIS — H25042 Posterior subcapsular polar age-related cataract, left eye: Secondary | ICD-10-CM | POA: Diagnosis not present

## 2021-01-09 DIAGNOSIS — H25812 Combined forms of age-related cataract, left eye: Secondary | ICD-10-CM | POA: Diagnosis not present

## 2021-01-09 DIAGNOSIS — H2512 Age-related nuclear cataract, left eye: Secondary | ICD-10-CM | POA: Diagnosis not present

## 2021-01-25 DIAGNOSIS — M25561 Pain in right knee: Secondary | ICD-10-CM | POA: Diagnosis not present

## 2021-01-25 DIAGNOSIS — M79642 Pain in left hand: Secondary | ICD-10-CM | POA: Diagnosis not present

## 2021-01-25 DIAGNOSIS — S60222A Contusion of left hand, initial encounter: Secondary | ICD-10-CM | POA: Diagnosis not present

## 2021-01-25 DIAGNOSIS — Z96651 Presence of right artificial knee joint: Secondary | ICD-10-CM | POA: Diagnosis not present

## 2021-01-25 DIAGNOSIS — L03115 Cellulitis of right lower limb: Secondary | ICD-10-CM | POA: Diagnosis not present

## 2021-02-05 ENCOUNTER — Other Ambulatory Visit: Payer: Self-pay

## 2021-02-05 ENCOUNTER — Ambulatory Visit
Admission: RE | Admit: 2021-02-05 | Discharge: 2021-02-05 | Disposition: A | Discharge status: Home / Self Care | Payer: Medicare PPO | Source: Ambulatory Visit | Attending: Family Medicine | Admitting: Family Medicine

## 2021-02-05 DIAGNOSIS — Z1231 Encounter for screening mammogram for malignant neoplasm of breast: Secondary | ICD-10-CM

## 2021-02-22 DIAGNOSIS — N1832 Chronic kidney disease, stage 3b: Secondary | ICD-10-CM | POA: Diagnosis not present

## 2021-02-22 DIAGNOSIS — D649 Anemia, unspecified: Secondary | ICD-10-CM | POA: Diagnosis not present

## 2021-02-22 DIAGNOSIS — I10 Essential (primary) hypertension: Secondary | ICD-10-CM | POA: Diagnosis not present

## 2021-02-22 DIAGNOSIS — E785 Hyperlipidemia, unspecified: Secondary | ICD-10-CM | POA: Diagnosis not present

## 2021-02-22 DIAGNOSIS — E1140 Type 2 diabetes mellitus with diabetic neuropathy, unspecified: Secondary | ICD-10-CM | POA: Diagnosis not present

## 2021-02-22 DIAGNOSIS — Z0000 Encounter for general adult medical examination without abnormal findings: Secondary | ICD-10-CM | POA: Diagnosis not present

## 2021-02-22 DIAGNOSIS — Z23 Encounter for immunization: Secondary | ICD-10-CM | POA: Diagnosis not present

## 2021-02-22 DIAGNOSIS — E1121 Type 2 diabetes mellitus with diabetic nephropathy: Secondary | ICD-10-CM | POA: Diagnosis not present

## 2021-02-22 DIAGNOSIS — E1165 Type 2 diabetes mellitus with hyperglycemia: Secondary | ICD-10-CM | POA: Diagnosis not present

## 2021-03-11 DIAGNOSIS — E1165 Type 2 diabetes mellitus with hyperglycemia: Secondary | ICD-10-CM | POA: Diagnosis not present

## 2021-03-11 DIAGNOSIS — E785 Hyperlipidemia, unspecified: Secondary | ICD-10-CM | POA: Diagnosis not present

## 2021-03-11 DIAGNOSIS — H35033 Hypertensive retinopathy, bilateral: Secondary | ICD-10-CM | POA: Diagnosis not present

## 2021-03-11 DIAGNOSIS — I10 Essential (primary) hypertension: Secondary | ICD-10-CM | POA: Diagnosis not present

## 2021-03-11 DIAGNOSIS — N1832 Chronic kidney disease, stage 3b: Secondary | ICD-10-CM | POA: Diagnosis not present

## 2021-03-11 DIAGNOSIS — E1140 Type 2 diabetes mellitus with diabetic neuropathy, unspecified: Secondary | ICD-10-CM | POA: Diagnosis not present

## 2021-03-11 DIAGNOSIS — D649 Anemia, unspecified: Secondary | ICD-10-CM | POA: Diagnosis not present

## 2021-03-11 DIAGNOSIS — E1121 Type 2 diabetes mellitus with diabetic nephropathy: Secondary | ICD-10-CM | POA: Diagnosis not present

## 2021-03-24 ENCOUNTER — Inpatient Hospital Stay (HOSPITAL_COMMUNITY)
Admission: EM | Admit: 2021-03-24 | Discharge: 2021-03-28 | DRG: 481 | Disposition: A | Discharge status: Skilled Nursing Facility | Payer: Medicare PPO | Attending: Internal Medicine | Admitting: Internal Medicine

## 2021-03-24 ENCOUNTER — Emergency Department (HOSPITAL_COMMUNITY): Payer: Medicare PPO

## 2021-03-24 ENCOUNTER — Other Ambulatory Visit: Payer: Self-pay

## 2021-03-24 ENCOUNTER — Encounter (HOSPITAL_COMMUNITY): Payer: Self-pay | Admitting: Emergency Medicine

## 2021-03-24 DIAGNOSIS — Z419 Encounter for procedure for purposes other than remedying health state, unspecified: Secondary | ICD-10-CM | POA: Diagnosis not present

## 2021-03-24 DIAGNOSIS — S72401A Unspecified fracture of lower end of right femur, initial encounter for closed fracture: Secondary | ICD-10-CM | POA: Diagnosis not present

## 2021-03-24 DIAGNOSIS — N184 Chronic kidney disease, stage 4 (severe): Secondary | ICD-10-CM | POA: Diagnosis present

## 2021-03-24 DIAGNOSIS — R609 Edema, unspecified: Secondary | ICD-10-CM | POA: Diagnosis not present

## 2021-03-24 DIAGNOSIS — Z89432 Acquired absence of left foot: Secondary | ICD-10-CM | POA: Diagnosis not present

## 2021-03-24 DIAGNOSIS — Z6836 Body mass index (BMI) 36.0-36.9, adult: Secondary | ICD-10-CM

## 2021-03-24 DIAGNOSIS — Z79899 Other long term (current) drug therapy: Secondary | ICD-10-CM | POA: Diagnosis not present

## 2021-03-24 DIAGNOSIS — S72451A Displaced supracondylar fracture without intracondylar extension of lower end of right femur, initial encounter for closed fracture: Secondary | ICD-10-CM | POA: Diagnosis not present

## 2021-03-24 DIAGNOSIS — I129 Hypertensive chronic kidney disease with stage 1 through stage 4 chronic kidney disease, or unspecified chronic kidney disease: Secondary | ICD-10-CM | POA: Diagnosis present

## 2021-03-24 DIAGNOSIS — Z888 Allergy status to other drugs, medicaments and biological substances status: Secondary | ICD-10-CM

## 2021-03-24 DIAGNOSIS — D62 Acute posthemorrhagic anemia: Secondary | ICD-10-CM | POA: Diagnosis not present

## 2021-03-24 DIAGNOSIS — E669 Obesity, unspecified: Secondary | ICD-10-CM | POA: Diagnosis present

## 2021-03-24 DIAGNOSIS — J45909 Unspecified asthma, uncomplicated: Secondary | ICD-10-CM | POA: Diagnosis present

## 2021-03-24 DIAGNOSIS — R9431 Abnormal electrocardiogram [ECG] [EKG]: Secondary | ICD-10-CM | POA: Diagnosis not present

## 2021-03-24 DIAGNOSIS — S7290XA Unspecified fracture of unspecified femur, initial encounter for closed fracture: Secondary | ICD-10-CM | POA: Diagnosis not present

## 2021-03-24 DIAGNOSIS — T148XXA Other injury of unspecified body region, initial encounter: Secondary | ICD-10-CM

## 2021-03-24 DIAGNOSIS — I10 Essential (primary) hypertension: Secondary | ICD-10-CM | POA: Diagnosis present

## 2021-03-24 DIAGNOSIS — S72491D Other fracture of lower end of right femur, subsequent encounter for closed fracture with routine healing: Secondary | ICD-10-CM | POA: Diagnosis not present

## 2021-03-24 DIAGNOSIS — E875 Hyperkalemia: Secondary | ICD-10-CM | POA: Diagnosis not present

## 2021-03-24 DIAGNOSIS — Z882 Allergy status to sulfonamides status: Secondary | ICD-10-CM | POA: Diagnosis not present

## 2021-03-24 DIAGNOSIS — E1122 Type 2 diabetes mellitus with diabetic chronic kidney disease: Secondary | ICD-10-CM | POA: Diagnosis not present

## 2021-03-24 DIAGNOSIS — Z20822 Contact with and (suspected) exposure to covid-19: Secondary | ICD-10-CM | POA: Diagnosis not present

## 2021-03-24 DIAGNOSIS — E119 Type 2 diabetes mellitus without complications: Secondary | ICD-10-CM | POA: Diagnosis not present

## 2021-03-24 DIAGNOSIS — M109 Gout, unspecified: Secondary | ICD-10-CM | POA: Diagnosis present

## 2021-03-24 DIAGNOSIS — Z794 Long term (current) use of insulin: Secondary | ICD-10-CM | POA: Diagnosis not present

## 2021-03-24 DIAGNOSIS — R600 Localized edema: Secondary | ICD-10-CM

## 2021-03-24 DIAGNOSIS — M1990 Unspecified osteoarthritis, unspecified site: Secondary | ICD-10-CM | POA: Diagnosis present

## 2021-03-24 DIAGNOSIS — Z7401 Bed confinement status: Secondary | ICD-10-CM | POA: Diagnosis not present

## 2021-03-24 DIAGNOSIS — E1165 Type 2 diabetes mellitus with hyperglycemia: Secondary | ICD-10-CM | POA: Diagnosis present

## 2021-03-24 DIAGNOSIS — E785 Hyperlipidemia, unspecified: Secondary | ICD-10-CM | POA: Diagnosis present

## 2021-03-24 DIAGNOSIS — N179 Acute kidney failure, unspecified: Secondary | ICD-10-CM | POA: Diagnosis not present

## 2021-03-24 DIAGNOSIS — Z9181 History of falling: Secondary | ICD-10-CM | POA: Diagnosis not present

## 2021-03-24 DIAGNOSIS — Z7982 Long term (current) use of aspirin: Secondary | ICD-10-CM

## 2021-03-24 DIAGNOSIS — S79929A Unspecified injury of unspecified thigh, initial encounter: Secondary | ICD-10-CM | POA: Diagnosis not present

## 2021-03-24 DIAGNOSIS — S72491A Other fracture of lower end of right femur, initial encounter for closed fracture: Secondary | ICD-10-CM | POA: Diagnosis not present

## 2021-03-24 DIAGNOSIS — N183 Chronic kidney disease, stage 3 unspecified: Secondary | ICD-10-CM

## 2021-03-24 DIAGNOSIS — M6281 Muscle weakness (generalized): Secondary | ICD-10-CM | POA: Diagnosis not present

## 2021-03-24 DIAGNOSIS — Z96651 Presence of right artificial knee joint: Secondary | ICD-10-CM | POA: Diagnosis not present

## 2021-03-24 DIAGNOSIS — R2689 Other abnormalities of gait and mobility: Secondary | ICD-10-CM | POA: Diagnosis not present

## 2021-03-24 DIAGNOSIS — W19XXXA Unspecified fall, initial encounter: Secondary | ICD-10-CM

## 2021-03-24 DIAGNOSIS — G8918 Other acute postprocedural pain: Secondary | ICD-10-CM | POA: Diagnosis not present

## 2021-03-24 DIAGNOSIS — M9711XA Periprosthetic fracture around internal prosthetic right knee joint, initial encounter: Secondary | ICD-10-CM | POA: Diagnosis present

## 2021-03-24 DIAGNOSIS — M7989 Other specified soft tissue disorders: Secondary | ICD-10-CM | POA: Diagnosis not present

## 2021-03-24 DIAGNOSIS — R627 Adult failure to thrive: Secondary | ICD-10-CM | POA: Diagnosis present

## 2021-03-24 DIAGNOSIS — M25561 Pain in right knee: Secondary | ICD-10-CM | POA: Diagnosis not present

## 2021-03-24 DIAGNOSIS — M9701XA Periprosthetic fracture around internal prosthetic right hip joint, initial encounter: Secondary | ICD-10-CM | POA: Diagnosis not present

## 2021-03-24 DIAGNOSIS — E1142 Type 2 diabetes mellitus with diabetic polyneuropathy: Secondary | ICD-10-CM | POA: Diagnosis present

## 2021-03-24 DIAGNOSIS — S8991XA Unspecified injury of right lower leg, initial encounter: Secondary | ICD-10-CM | POA: Diagnosis not present

## 2021-03-24 DIAGNOSIS — S72351A Displaced comminuted fracture of shaft of right femur, initial encounter for closed fracture: Secondary | ICD-10-CM | POA: Diagnosis not present

## 2021-03-24 DIAGNOSIS — R41841 Cognitive communication deficit: Secondary | ICD-10-CM | POA: Diagnosis not present

## 2021-03-24 DIAGNOSIS — Z91048 Other nonmedicinal substance allergy status: Secondary | ICD-10-CM

## 2021-03-24 LAB — CBC WITH DIFFERENTIAL/PLATELET
Abs Immature Granulocytes: 0.04 10*3/uL (ref 0.00–0.07)
Basophils Absolute: 0.1 10*3/uL (ref 0.0–0.1)
Basophils Relative: 1 %
Eosinophils Absolute: 0.4 10*3/uL (ref 0.0–0.5)
Eosinophils Relative: 4 %
HCT: 32.4 % — ABNORMAL LOW (ref 36.0–46.0)
Hemoglobin: 10.7 g/dL — ABNORMAL LOW (ref 12.0–15.0)
Immature Granulocytes: 0 %
Lymphocytes Relative: 10 %
Lymphs Abs: 1.2 10*3/uL (ref 0.7–4.0)
MCH: 32.4 pg (ref 26.0–34.0)
MCHC: 33 g/dL (ref 30.0–36.0)
MCV: 98.2 fL (ref 80.0–100.0)
Monocytes Absolute: 0.7 10*3/uL (ref 0.1–1.0)
Monocytes Relative: 6 %
Neutro Abs: 9 10*3/uL — ABNORMAL HIGH (ref 1.7–7.7)
Neutrophils Relative %: 79 %
Platelets: 233 10*3/uL (ref 150–400)
RBC: 3.3 MIL/uL — ABNORMAL LOW (ref 3.87–5.11)
RDW: 14.6 % (ref 11.5–15.5)
WBC: 11.4 10*3/uL — ABNORMAL HIGH (ref 4.0–10.5)
nRBC: 0 % (ref 0.0–0.2)

## 2021-03-24 LAB — COMPREHENSIVE METABOLIC PANEL
ALT: 17 U/L (ref 0–44)
AST: 22 U/L (ref 15–41)
Albumin: 3.7 g/dL (ref 3.5–5.0)
Alkaline Phosphatase: 83 U/L (ref 38–126)
Anion gap: 6 (ref 5–15)
BUN: 24 mg/dL — ABNORMAL HIGH (ref 8–23)
CO2: 28 mmol/L (ref 22–32)
Calcium: 8.8 mg/dL — ABNORMAL LOW (ref 8.9–10.3)
Chloride: 108 mmol/L (ref 98–111)
Creatinine, Ser: 1.53 mg/dL — ABNORMAL HIGH (ref 0.44–1.00)
GFR, Estimated: 34 mL/min — ABNORMAL LOW (ref >60)
Glucose, Bld: 226 mg/dL — ABNORMAL HIGH (ref 70–99)
Potassium: 4.5 mmol/L (ref 3.5–5.1)
Sodium: 142 mmol/L (ref 135–145)
Total Bilirubin: 0.5 mg/dL (ref 0.3–1.2)
Total Protein: 6.7 g/dL (ref 6.5–8.1)

## 2021-03-24 LAB — RESP PANEL BY RT-PCR (FLU A&B, COVID) ARPGX2
Influenza A by PCR: NEGATIVE
Influenza B by PCR: NEGATIVE
SARS Coronavirus 2 by RT PCR: NEGATIVE

## 2021-03-24 LAB — CBG MONITORING, ED: Glucose-Capillary: 242 mg/dL — ABNORMAL HIGH (ref 70–99)

## 2021-03-24 MED ORDER — OYSTER SHELL CALCIUM/D3 500-5 MG-MCG PO TABS
1.0000 | ORAL_TABLET | Freq: Every day | ORAL | Status: DC
  Administered 2021-03-25 – 2021-03-28 (×4): 1 via ORAL
  Filled 2021-03-24 (×4): qty 1

## 2021-03-24 MED ORDER — GABAPENTIN 300 MG PO CAPS: 300.0000 mg | ORAL_CAPSULE | ORAL | Status: DC

## 2021-03-24 MED ORDER — HYDRALAZINE HCL 10 MG PO TABS: 10.0000 mg | ORAL_TABLET | Freq: Four times a day (QID) | ORAL | Status: DC | PRN

## 2021-03-24 MED ORDER — CALCIUM-D 600-400 MG-UNIT PO TABS: 1.0000 | ORAL_TABLET | Freq: Every day | ORAL | Status: DC

## 2021-03-24 MED ORDER — LORATADINE 10 MG PO TABS
10.0000 mg | ORAL_TABLET | Freq: Every day | ORAL | Status: DC
  Administered 2021-03-25 – 2021-03-28 (×4): 10 mg via ORAL
  Filled 2021-03-24 (×4): qty 1

## 2021-03-24 MED ORDER — INSULIN ASPART 100 UNIT/ML IJ SOLN
0.0000 [IU] | Freq: Three times a day (TID) | INTRAMUSCULAR | Status: DC
  Administered 2021-03-26 (×3): 15 [IU] via SUBCUTANEOUS
  Administered 2021-03-27: 3 [IU] via SUBCUTANEOUS
  Administered 2021-03-27: 5 [IU] via SUBCUTANEOUS
  Administered 2021-03-27 – 2021-03-28 (×2): 3 [IU] via SUBCUTANEOUS
  Filled 2021-03-24: qty 0.15

## 2021-03-24 MED ORDER — OXYCODONE-ACETAMINOPHEN 5-325 MG PO TABS
1.0000 | ORAL_TABLET | Freq: Four times a day (QID) | ORAL | Status: DC | PRN
  Administered 2021-03-24 – 2021-03-28 (×7): 2 via ORAL
  Filled 2021-03-24 (×7): qty 2

## 2021-03-24 MED ORDER — CLOBETASOL PROPIONATE 0.05 % EX OINT: 1.0000 "application " | TOPICAL_OINTMENT | Freq: Every day | CUTANEOUS | Status: DC | PRN

## 2021-03-24 MED ORDER — FERROUS SULFATE 325 (65 FE) MG PO TABS
325.0000 mg | ORAL_TABLET | Freq: Two times a day (BID) | ORAL | Status: DC
  Administered 2021-03-25 – 2021-03-28 (×7): 325 mg via ORAL
  Filled 2021-03-24 (×6): qty 1

## 2021-03-24 MED ORDER — FLUOCINONIDE 0.05 % EX CREA: 1.0000 "application " | TOPICAL_CREAM | Freq: Two times a day (BID) | CUTANEOUS | Status: DC | PRN

## 2021-03-24 MED ORDER — GABAPENTIN 400 MG PO CAPS
400.0000 mg | ORAL_CAPSULE | Freq: Three times a day (TID) | ORAL | Status: DC
  Administered 2021-03-24 – 2021-03-27 (×7): 400 mg via ORAL
  Filled 2021-03-24 (×7): qty 1

## 2021-03-24 MED ORDER — ASCORBIC ACID 500 MG PO TABS
500.0000 mg | ORAL_TABLET | Freq: Every day | ORAL | Status: DC
  Administered 2021-03-25 – 2021-03-28 (×4): 500 mg via ORAL
  Filled 2021-03-24 (×4): qty 1

## 2021-03-24 MED ORDER — HYDROMORPHONE HCL 1 MG/ML IJ SOLN
0.5000 mg | INTRAMUSCULAR | Status: DC | PRN
  Administered 2021-03-24 – 2021-03-25 (×3): 0.5 mg via INTRAVENOUS
  Filled 2021-03-24 (×3): qty 1

## 2021-03-24 MED ORDER — TRAMADOL HCL 50 MG PO TABS
50.0000 mg | ORAL_TABLET | Freq: Two times a day (BID) | ORAL | Status: DC
  Administered 2021-03-24 – 2021-03-28 (×9): 50 mg via ORAL
  Filled 2021-03-24 (×9): qty 1

## 2021-03-24 MED ORDER — ALLOPURINOL 100 MG PO TABS
100.0000 mg | ORAL_TABLET | Freq: Two times a day (BID) | ORAL | Status: DC
  Administered 2021-03-24 – 2021-03-28 (×9): 100 mg via ORAL
  Filled 2021-03-24 (×10): qty 1

## 2021-03-24 MED ORDER — FENTANYL CITRATE PF 50 MCG/ML IJ SOSY
100.0000 ug | PREFILLED_SYRINGE | Freq: Once | INTRAMUSCULAR | Status: AC
  Administered 2021-03-24: 100 ug via INTRAVENOUS
  Filled 2021-03-24: qty 2

## 2021-03-24 MED ORDER — LINAGLIPTIN 5 MG PO TABS: 5.0000 mg | ORAL_TABLET | Freq: Every day | ORAL | Status: DC

## 2021-03-24 MED ORDER — SODIUM CHLORIDE 0.9 % IV SOLN: INTRAVENOUS | Status: DC

## 2021-03-24 MED ORDER — BISOPROLOL FUMARATE 10 MG PO TABS
10.0000 mg | ORAL_TABLET | Freq: Every day | ORAL | Status: DC
  Administered 2021-03-25 – 2021-03-28 (×4): 10 mg via ORAL
  Filled 2021-03-24: qty 1
  Filled 2021-03-24: qty 2
  Filled 2021-03-24 (×2): qty 1

## 2021-03-24 MED ORDER — SIMVASTATIN 5 MG PO TABS
5.0000 mg | ORAL_TABLET | Freq: Every day | ORAL | Status: DC
  Administered 2021-03-24 – 2021-03-28 (×5): 5 mg via ORAL
  Filled 2021-03-24 (×6): qty 1

## 2021-03-24 MED ORDER — INSULIN GLARGINE-YFGN 100 UNIT/ML ~~LOC~~ SOLN
30.0000 [IU] | Freq: Every day | SUBCUTANEOUS | Status: DC
  Administered 2021-03-24 – 2021-03-26 (×2): 30 [IU] via SUBCUTANEOUS
  Filled 2021-03-24 (×4): qty 0.3

## 2021-03-24 MED ORDER — SENNOSIDES-DOCUSATE SODIUM 8.6-50 MG PO TABS: 1.0000 | ORAL_TABLET | Freq: Every evening | ORAL | Status: DC | PRN

## 2021-03-24 NOTE — ED Triage Notes (Signed)
Pt arrives by [facility], Per EMS: pt coming from home w/ c/o r knee pain after falling when getting out of car today. Pt fell on right knee. Pt has shortening and rotation. Denies hip pain. Denies LOC, denies hitting head, denies blood thinners  other than baby aspirin. 133mcg fentanyl given en route. All vitals WDL

## 2021-03-24 NOTE — Progress Notes (Signed)
Orthopedic Tech Progress Note Patient Details:  Angela Castro [birth year] [id number]  Ortho Devices Type of Ortho Device: Knee Immobilizer Ortho Device/Splint Location: right Ortho Device/Splint Interventions: Application   Post Interventions Patient Tolerated: Well Instructions Provided: Care of device  [place] Pink 03/24/2021, 5:29 PM

## 2021-03-24 NOTE — Progress Notes (Signed)
Consulted by EDP for R ppx distal femur fx. EDP has contacted [facility] for admission; Patient will need to be admitted to [facility] for ortho trauma. Please place knee immobilizer. NWB RLE. CT scan R knee pending. Will need ORIF. I have spoken with Dr. Doreatha Martin, who plans on surgery, likely tomorrow. NPO after MN.

## 2021-03-24 NOTE — H&P (Addendum)
History and Physical    Angela Castro WUJ:[id number] DOB: [birth year] DOA: 03/24/2021  PCP: London Pepper, MD Patient coming from: Home  I have personally briefly reviewed patient's old medical records in [place]  Chief Complaint: Right knee pain after falling when getting out of car today  HPI: Angela Castro is a 80 y.o. female with medical history significant of HTN, HLD, IDDM 2, CKD 3, sent to [facility], ER by EMS after found on getting out of car today, she fell on her right knee. She is in her usual state of health, she went to do [employer], returned home and trying get out of her car in her garage and fell, she denies sob, no chest pain, no dizziness, no fever. She was treated with abx two weeks ago for left lower leg cellulitis and she finished treatment and reports is doing well.  ED Course: Blood pressure (!) 147/73, pulse 71, temperature 97.9 F (36.6 C), temperature source Oral, resp. rate 15, SpO2 96 %.  CBC/CMP no significant change from her baseline except for mild leukocytosis WBC 11.4, blood glucose 226. CT right knee showed:Acute comminuted and displaced periprosthetic fracture of the distal femoral metaphysis  COVID screening in process Patient received IV fentanyl for pain control, IV hydration in the ED, EDP discussed case with Ortho Dr. Lyla Glassing who recommend placed knee immobilizer , NWB R LE , n.p.o. after midnight ,hospitalist admission to [facility] for Ortho trauma.  Review of Systems: As per HPI otherwise all other systems reviewed and are negative.   Past Medical History:  Diagnosis Date   Asthma    Diabetes mellitus without complication ([place])    Hypertension     Past Surgical History:  Procedure Laterality Date   AMPUTATION Left 08/26/2016   Procedure: AMPUTATION RAY/1ST;  Surgeon: Wylene Simmer, MD;  Location: [facility];  Service: Orthopedics;  Laterality: Left;   IR GENERIC HISTORICAL  07/30/2016   IR US GUIDE VASC ACCESS RIGHT  07/30/2016 Markus Daft, MD [facility]   IR GENERIC HISTORICAL  07/30/2016   IR FLUORO GUIDE CV MIDLINE PICC RIGHT 07/30/2016 Markus Daft, MD [facility]   KNEE ARTHROSCOPY     x 4   REPLACEMENT TOTAL KNEE      Social History  reports that she has never smoked. She has never used smokeless tobacco. She reports that she does not drink alcohol and does not use drugs.  Allergies  Allergen Reactions   Sulfa Antibiotics Shortness Of Breath and Rash   Adhesive [Tape] Other (See Comments)    Causes redness, please use paper tape   Keflex [Cephalexin] Other (See Comments)    Possibly caused lethargy (taken with Flagyl)   Lisinopril Cough   Metronidazole Other (See Comments)    Possibly caused lethargy (taken with keflex)   Teflaro [Ceftaroline] Other (See Comments)    Lethargy, possibly rash    History reviewed. No pertinent family history.  Prior to Admission medications   Medication Sig Start Date End Date Taking? Authorizing Provider  allopurinol (ZYLOPRIM) 100 MG tablet Take 100 mg by mouth 2 (two) times daily.     [provider]  amLODipine (NORVASC) 5 MG tablet Take 5 mg by mouth daily.    [provider]  aspirin EC 81 MG tablet Take 81 mg by mouth daily.    [provider]  bisoprolol (ZEBETA) 10 MG tablet Take 10 mg by mouth daily.    [provider]  Calcium Carbonate-Vitamin D (CALCIUM-D)  600-400 MG-UNIT TABS Take 1 tablet by mouth daily.    [provider]  clobetasol (TEMOVATE) 0.05 % GEL Apply 1 application topically daily as needed (psoriasis).     [provider]  ferrous sulfate (FERROUSUL) 325 (65 FE) MG tablet Take 1 tablet (325 mg total) by mouth 2 (two) times daily with a meal. 09/03/16   Domenic Polite, MD  fluocinonide cream (LIDEX) 1.02 % Apply 1 application topically 2 (two) times daily as needed (psoriasis).     [provider]  gabapentin (NEURONTIN) 300 MG capsule Take 300-600 mg by mouth See admin  instructions. Take 1 capsule (300 mg) by mouth every morning and 2 capsules (600 mg) at night    [provider]  Insulin Glargine (LANTUS SOLOSTAR) 100 UNIT/ML Solostar Pen Inject 30 Units into the skin at bedtime. Titrate up Insulin dose at the time of Follow up with PCP 08/28/16   Domenic Polite, MD  levofloxacin (LEVAQUIN) 500 MG tablet Take 1 tablet (500 mg total) by mouth every other day. For 4days 08/29/16   Domenic Polite, MD  linagliptin (TRADJENTA) 5 MG TABS tablet Take 5 mg by mouth daily.    [provider]  linezolid (ZYVOX) 600 MG tablet Take 1 tablet (600 mg total) by mouth every 12 (twelve) hours. For 4days 08/28/16   Domenic Polite, MD  loratadine (CLARITIN) 10 MG tablet Take 10 mg by mouth daily.    [provider]  oxyCODONE-acetaminophen (PERCOCET/ROXICET) 5-325 MG tablet Take 1-2 tablets by mouth every 6 (six) hours as needed for moderate pain or severe pain. 08/28/16   Domenic Polite, MD  senna-docusate (SENOKOT-S) 8.6-50 MG tablet Take 1 tablet by mouth at bedtime as needed for mild constipation. 08/28/16   Domenic Polite, MD  simvastatin (ZOCOR) 5 MG tablet Take 5 mg by mouth at bedtime.     [provider]  traMADol (ULTRAM) 50 MG tablet Take 50 mg by mouth 2 (two) times daily.     [provider]  vitamin C (ASCORBIC ACID) 500 MG tablet Take 500 mg by mouth daily.    [provider]    Physical Exam: Vitals:   03/24/21 1500 03/24/21 1600 03/24/21 1615  BP: (!) 186/90 (!) 147/73   Pulse: 72 69 71  Resp: 19 15   Temp: 97.9 F (36.6 C)    TempSrc: Oral    SpO2: 98% 92% 96%    Constitutional: NAD, calm, comfortable Eyes: PERRL, lids and conjunctivae normal ENMT: Mucous membranes are moist.  Respiratory: clear to auscultation bilaterally, no wheezing, no crackles. Normal respiratory effort. No accessory muscle use.  Cardiovascular: Regular rate and rhythm,  2+ pedal pulses. No carotid bruits.  Abdomen: no  tenderness, not distended, Bowel sounds positive.  Musculoskeletal: Right knee in immobilizer, bilateral lower extremity pitting edema, prior left first toe amputation  Skin: bilateral lower extremity venous stasis changes, mild erythema on anterior chin LLE Neurologic: CN 2-12 grossly intact. Sensation intact, Strength 5/5 in all 4.  Psychiatric: Normal judgment and insight. Alert and oriented x 3. Normal mood.    Labs on Admission: I have personally reviewed following labs and imaging studies  CBC: Recent Labs  Lab 03/24/21 1545  WBC 11.4*  NEUTROABS 9.0*  HGB 10.7*  HCT 32.4*  MCV 98.2  PLT 725    Basic Metabolic Panel: Recent Labs  Lab 03/24/21 1545  NA 142  K 4.5  CL 108  CO2 28  GLUCOSE 226*  BUN 24*  CREATININE  1.53*  CALCIUM 8.8*    GFR: CrCl cannot be calculated (Unknown ideal weight.).  Liver Function Tests: Recent Labs  Lab 03/24/21 1545  AST 22  ALT 17  ALKPHOS 83  BILITOT 0.5  PROT 6.7  ALBUMIN 3.7    Urine analysis:    Component Value Date/Time   COLORURINE YELLOW 08/24/2016 1508   APPEARANCEUR HAZY (A) 08/24/2016 1508   LABSPEC 1.015 08/24/2016 1508   PHURINE 5.0 08/24/2016 1508   GLUCOSEU NEGATIVE 08/24/2016 1508   HGBUR NEGATIVE 08/24/2016 1508   BILIRUBINUR NEGATIVE 08/24/2016 1508   KETONESUR NEGATIVE 08/24/2016 1508   PROTEINUR NEGATIVE 08/24/2016 1508   NITRITE NEGATIVE 08/24/2016 1508   LEUKOCYTESUR NEGATIVE 08/24/2016 1508    Radiological Exams on Admission: CT KNEE RIGHT WO CONTRAST  Result Date: 03/24/2021 CLINICAL DATA:  Right knee periprosthetic femur fracture. EXAM: CT OF THE RIGHT KNEE WITHOUT CONTRAST TECHNIQUE: Multidetector CT imaging of the right knee was performed according to the standard protocol. Multiplanar CT image reconstructions were also generated. COMPARISON:  Right knee x-rays from same day. FINDINGS: Bones/Joint/Cartilage Right total knee arthroplasty. Acute comminuted periprosthetic fracture of the  distal femoral metaphysis again noted with up to 2.5 cm lateral displacement, 1.9 cm posterior displacement, and 1.9 cm overriding. There is a nondisplaced longitudinal component extending superiorly into the diaphysis along the posterior cortex (series 604, image 24). No dislocation. No joint effusion. Ligaments Ligaments are suboptimally evaluated by CT. Muscles and Tendons Grossly intact. Moderate atrophy of the visualized lower leg muscles below the knee. Soft tissue Diffuse soft tissue swelling. No fluid collection or hematoma. No soft tissue mass. IMPRESSION: 1. Acute comminuted and displaced periprosthetic fracture of the distal femoral metaphysis as described above. Electronically Signed   By: Titus Dubin M.D.   On: 03/24/2021 16:47   DG Knee Complete 4 Views Right  Result Date: 03/24/2021 CLINICAL DATA:  Right knee pain after fall. EXAM: RIGHT KNEE - COMPLETE 4+ VIEW COMPARISON:  None. FINDINGS: Right total knee arthroplasty. No periprosthetic lucency. Acute periprosthetic fracture of the distal femoral metaphysis with 3.9 cm lateral displacement, 1.8 cm posterior displacement, and 1.8 cm of overriding. No dislocation. Diffuse soft tissue swelling. IMPRESSION: 1. Acute displaced periprosthetic fracture of the distal femoral metaphysis. Electronically Signed   By: Titus Dubin M.D.   On: 03/24/2021 15:34    EKG: Independently reviewed.   Assessment/Plan Active Problems:   Femur fracture (HCC)   Right distal femur fracture after mechanical fall -In knee immobilizer, NWB RLE -Pain control -Admit to [facility] for Ortho trauma  -n.p.o. after midnight -will follow Ortho recommendation  Insulin-dependent type 2 diabetes/is peripheral neuropathy Reports taking lantus 61units qhs at home, reports only check blood glucose a few times a week, but range from 80- 130 at home Will decrease lantus to 30units qhs due to npo for surgery tomorrow, start ssi Continue Neurontin 400 3 times  daily Hold Januvia and glipizide Check a1c  Bilateral lower extremity pitting edema Reports chronic, was told to elevate legs Never been on diuretics, not wearing compression stocking either Will hold norvasc venous doppler bilateral lower extremity ordered  Elevated leg   CKD 3 Stable at baseline Reports followed by nephrology   Hypertension Continue zebeta, hold norvasc due to edema Hold Cozaar for now, plan to resume post op Prn hydralazine  Hyperlipidemia Continue statin   Gout Stable, continue allopurinol  FTT/high fall risk with h/o peripheral neuropathy,had bilateral knee operation , had left first toe amputation Will need fall  precaution, start PT when ok with ortho   DVT prophylaxis: SCDs Start: 03/24/21 1751  To be determined after Ortho surgery   Code Status:  Full  Family Communication:    Patient is from: Home   Anticipated DC to: To be determined   Anticipated DC date:  To be determined   Consults called:  Orthopedics Dr. Maree Erie. [place] Admission status:  Inpatient  Severity of Illness:  Inpatient  The appropriate patient status for this patient is INPATIENT due to history and comorbidities, severity of illness, required intensity of service to ensure the patient's safety and to avoid risk of adverse events/further clinical deterioration.  Severity of illness/comorbidities: Right distal femur fracture Intensity of service: tests, high frequency of surveillance, interventions It is not anticipated that the patient will be medically stable for discharge from the hospital within 2 midnights of admission.    Voice Recognition Viviann Spare dictation system was used to create this note, attempts have been made to correct errors. Please contact the author with questions and/or clarifications.  Florencia Reasons MD PhD FACP [facility]  How to contact the [facility] Attending or Consulting provider [place] or covering provider during after hours [place], for this  patient?   Check the care team in [facility] and look for a) attending/consulting [facility] provider listed and b) the [facility] team listed Log into [email or web address] and use [place] universal password to access. If you do not have the password, please contact the hospital operator. Locate the [facility] provider you are looking for under [facility] and page to a number that you can be directly reached. If you still have difficulty reaching the provider, please page the [facility] (Director on Call) for the Hospitalists listed on amion for assistance.  03/24/2021, 6:32 PM

## 2021-03-24 NOTE — ED Provider Notes (Signed)
[facility] DEPT Provider Note   CSN: [id number] Arrival date & time: 03/24/21  1444     History Chief Complaint  Patient presents with   Fall   Knee Pain    Right    Angela Castro is a 80 y.o. female.  HPI She presents for evaluation of a fall that occurred when she was walking after getting out of the car.  She fell forward onto both knees.  She could not get up afterwards because of severe right knee pain.  She denies head injury, neck pain or back pain.  She has been having some swelling in the affected knee, right side, for about a week and plan to see her orthopedist this week.  She has previous right knee prosthesis placement.  She denies recent illnesses including fever, chills, vomiting or dizziness.  There are no other known active modifying factors.    Past Medical History:  Diagnosis Date   Asthma    Diabetes mellitus without complication ([place])    Hypertension     Patient Active Problem List   Diagnosis Date Noted   Femur fracture ([place]) 03/24/2021   Osteomyelitis ([place]) 08/24/2016   Weakness 08/24/2016   Diabetes mellitus type 2 in obese ([place]) 08/24/2016   Acute-on-chronic renal failure ([place]) 08/24/2016   Foot ulcer with fat layer exposed, left ([place]) 08/19/2016   Medication monitoring encounter 08/19/2016   Chronic osteomyelitis ([place]) 07/29/2016   Diabetes mellitus, type II, insulin dependent ([place]) 07/14/2016   Gout 07/14/2016   Hypertension, essential 07/14/2016   Generalized osteoarthritis 07/14/2016    Past Surgical History:  Procedure Laterality Date   AMPUTATION Left 08/26/2016   Procedure: AMPUTATION RAY/1ST;  Surgeon: Wylene Simmer, MD;  Location: [facility];  Service: Orthopedics;  Laterality: Left;   IR GENERIC HISTORICAL  07/30/2016   IR US GUIDE VASC ACCESS RIGHT 07/30/2016 Markus Daft, MD [facility]   IR GENERIC HISTORICAL  07/30/2016   IR FLUORO GUIDE CV MIDLINE PICC RIGHT 07/30/2016 Markus Daft, MD [facility]   KNEE  ARTHROSCOPY     x 4   REPLACEMENT TOTAL KNEE       OB History   No obstetric history on file.     History reviewed. No pertinent family history.  Social History   Tobacco Use   Smoking status: Never   Smokeless tobacco: Never  Substance Use Topics   Alcohol use: No   Drug use: No    Home Medications Prior to Admission medications   Medication Sig Start Date End Date Taking? Authorizing Provider  allopurinol (ZYLOPRIM) 100 MG tablet Take 100 mg by mouth 2 (two) times daily.     [provider]  amLODipine (NORVASC) 5 MG tablet Take 5 mg by mouth daily.    [provider]  aspirin EC 81 MG tablet Take 81 mg by mouth daily.    [provider]  bisoprolol (ZEBETA) 10 MG tablet Take 10 mg by mouth daily.    [provider]  Calcium Carbonate-Vitamin D (CALCIUM-D) 600-400 MG-UNIT TABS Take 1 tablet by mouth daily.    [provider]  clobetasol (TEMOVATE) 0.05 % GEL Apply 1 application topically daily as needed (psoriasis).     [provider]  ferrous sulfate (FERROUSUL) 325 (65 FE) MG tablet Take 1 tablet (325 mg total) by mouth 2 (two) times daily with a meal. 09/03/16   Domenic Polite, MD  fluocinonide cream (LIDEX) 7.61 % Apply 1 application topically 2 (two) times  daily as needed (psoriasis).     [provider]  gabapentin (NEURONTIN) 300 MG capsule Take 300-600 mg by mouth See admin instructions. Take 1 capsule (300 mg) by mouth every morning and 2 capsules (600 mg) at night    [provider]  Insulin Glargine (LANTUS SOLOSTAR) 100 UNIT/ML Solostar Pen Inject 30 Units into the skin at bedtime. Titrate up Insulin dose at the time of Follow up with PCP 08/28/16   Domenic Polite, MD  levofloxacin (LEVAQUIN) 500 MG tablet Take 1 tablet (500 mg total) by mouth every other day. For 4days 08/29/16   Domenic Polite, MD  linagliptin (TRADJENTA) 5 MG TABS tablet Take 5 mg by mouth daily.    [provider]   linezolid (ZYVOX) 600 MG tablet Take 1 tablet (600 mg total) by mouth every 12 (twelve) hours. For 4days 08/28/16   Domenic Polite, MD  loratadine (CLARITIN) 10 MG tablet Take 10 mg by mouth daily.    [provider]  oxyCODONE-acetaminophen (PERCOCET/ROXICET) 5-325 MG tablet Take 1-2 tablets by mouth every 6 (six) hours as needed for moderate pain or severe pain. 08/28/16   Domenic Polite, MD  senna-docusate (SENOKOT-S) 8.6-50 MG tablet Take 1 tablet by mouth at bedtime as needed for mild constipation. 08/28/16   Domenic Polite, MD  simvastatin (ZOCOR) 5 MG tablet Take 5 mg by mouth at bedtime.     [provider]  traMADol (ULTRAM) 50 MG tablet Take 50 mg by mouth 2 (two) times daily.     [provider]  vitamin C (ASCORBIC ACID) 500 MG tablet Take 500 mg by mouth daily.    [provider]    Allergies    Sulfa antibiotics, Adhesive [tape], Keflex [cephalexin], Metronidazole, and Teflaro [ceftaroline]  Review of Systems   Review of Systems  All other systems reviewed and are negative.  Physical Exam Updated Vital Signs BP (!) 147/73   Pulse 71   Temp 97.9 F (36.6 C) (Oral)   Resp 15   SpO2 96%   Physical Exam Vitals and nursing note reviewed.  Constitutional:      General: She is not in acute distress.    Appearance: She is well-developed. She is not ill-appearing.  HENT:     Head: Normocephalic and atraumatic.     Right Ear: External ear normal.     Left Ear: External ear normal.  Eyes:     Conjunctiva/sclera: Conjunctivae normal.     Pupils: Pupils are equal, round, and reactive to light.  Neck:     Trachea: Phonation normal.  Cardiovascular:     Rate and Rhythm: Normal rate and regular rhythm.     Heart sounds: Normal heart sounds.  Pulmonary:     Effort: Pulmonary effort is normal.     Breath sounds: Normal breath sounds.  Abdominal:     General: There is no distension.     Palpations: Abdomen is soft.     Tenderness: There  is no abdominal tenderness.  Musculoskeletal:     Cervical back: Normal range of motion and neck supple.     Comments: Right leg with tenderness and swelling over the right knee.  Right leg is shortened relative to left.  Neurovascular intact distally in the right foot.  No right hip tenderness or crepitation.  Skin:    General: Skin is warm and dry.  Neurological:     Mental Status: She is alert and oriented to person, place, and time.  Cranial Nerves: No cranial nerve deficit.     Sensory: No sensory deficit.     Motor: No abnormal muscle tone.     Coordination: Coordination normal.  Psychiatric:        Mood and Affect: Mood normal.        Behavior: Behavior normal.        Thought Content: Thought content normal.        Judgment: Judgment normal.    ED Results / Procedures / Treatments   Labs (all labs ordered are listed, but only abnormal results are displayed) Labs Reviewed  COMPREHENSIVE METABOLIC PANEL - Abnormal; Notable for the following components:      Result Value   Glucose, Bld 226 (*)    BUN 24 (*)    Creatinine, Ser 1.53 (*)    Calcium 8.8 (*)    GFR, Estimated 34 (*)    All other components within normal limits  CBC WITH DIFFERENTIAL/PLATELET - Abnormal; Notable for the following components:   WBC 11.4 (*)    RBC 3.30 (*)    Hemoglobin 10.7 (*)    HCT 32.4 (*)    Neutro Abs 9.0 (*)    All other components within normal limits  RESP PANEL BY RT-PCR (FLU A&B, COVID) ARPGX2    EKG None  Radiology CT KNEE RIGHT WO CONTRAST  Result Date: 03/24/2021 CLINICAL DATA:  Right knee periprosthetic femur fracture. EXAM: CT OF THE RIGHT KNEE WITHOUT CONTRAST TECHNIQUE: Multidetector CT imaging of the right knee was performed according to the standard protocol. Multiplanar CT image reconstructions were also generated. COMPARISON:  Right knee x-rays from same day. FINDINGS: Bones/Joint/Cartilage Right total knee arthroplasty. Acute comminuted periprosthetic fracture  of the distal femoral metaphysis again noted with up to 2.5 cm lateral displacement, 1.9 cm posterior displacement, and 1.9 cm overriding. There is a nondisplaced longitudinal component extending superiorly into the diaphysis along the posterior cortex (series 604, image 24). No dislocation. No joint effusion. Ligaments Ligaments are suboptimally evaluated by CT. Muscles and Tendons Grossly intact. Moderate atrophy of the visualized lower leg muscles below the knee. Soft tissue Diffuse soft tissue swelling. No fluid collection or hematoma. No soft tissue mass. IMPRESSION: 1. Acute comminuted and displaced periprosthetic fracture of the distal femoral metaphysis as described above. Electronically Signed   By: Titus Dubin M.D.   On: 03/24/2021 16:47   DG Knee Complete 4 Views Right  Result Date: 03/24/2021 CLINICAL DATA:  Right knee pain after fall. EXAM: RIGHT KNEE - COMPLETE 4+ VIEW COMPARISON:  None. FINDINGS: Right total knee arthroplasty. No periprosthetic lucency. Acute periprosthetic fracture of the distal femoral metaphysis with 3.9 cm lateral displacement, 1.8 cm posterior displacement, and 1.8 cm of overriding. No dislocation. Diffuse soft tissue swelling. IMPRESSION: 1. Acute displaced periprosthetic fracture of the distal femoral metaphysis. Electronically Signed   By: Titus Dubin M.D.   On: 03/24/2021 15:34    Procedures Procedures   Medications Ordered in ED Medications  0.9 %  sodium chloride infusion ( Intravenous New Bag/Given 03/24/21 1608)  fentaNYL (SUBLIMAZE) injection 100 mcg (100 mcg Intravenous Given 03/24/21 1648)    ED Course  I have reviewed the triage vital signs and the nursing notes.  Pertinent labs & imaging results that were available during my care of the patient were reviewed by me and considered in my medical decision making (see chart for details).    MDM Rules/Calculators/A&P  Patient Vitals for the past 24 hrs:  BP Temp  Temp src Pulse Resp SpO2  03/24/21 1615 -- -- -- 71 -- 96 %  03/24/21 1600 (!) 147/73 -- -- 69 15 92 %  03/24/21 1500 (!) 186/90 97.9 F (36.6 C) Oral 72 19 98 %    5:04 PM Reevaluation with update and discussion. After initial assessment and treatment, an updated evaluation reveals she required additional dose of opiate.  She is fairly comfortable at this time.  Findings discussed with patient and husband, questions answered. Daleen Bo   Medical Decision Making:  This patient is presenting for evaluation of right knee pain after fall, which does require a range of treatment options, and is a complaint that involves a moderate risk of morbidity and mortality. The differential diagnoses include fracture, contusion, dislocation of the right leg. I decided to review old records, and in summary elderly female with prior right knee replacement presenting with isolated pain to the right knee after a fall.  She is not anticoagulated.  She has a history of hypertension, osteomyelitis, diabetes and renal insufficiency.  I did not require additional historical information from anyone.  Clinical Laboratory Tests Ordered, included CBC, Metabolic panel, and viral panel . Review indicates normal except glucose high, BUN high, creatinine high, calcium low, white count high, hemoglobin low. Radiologic Tests Ordered, included x-ray right knee .  I independently Visualized: Radiographic images, which show periprostatic fracture, distal femur angulated and displaced     Critical Interventions-clinical evaluation, laboratory testing, radiography, pain medicines, discussion with orthopedics, observation and reassessment  After These Interventions, the Patient was reevaluated and was found with mechanical fall resulting in right femur fracture.  This is periprostatic, complicated, will require orthopedic traumatologist.  Patient will be [facility] to [facility] for [facility].  Screen evaluation begun  reveals stable chronic abnormalities including hyperglycemia, azotemia and renal insufficiency hemoglobin stable at 10.7.  CRITICAL CARE-no Performed by: Daleen Bo  Nursing Notes Reviewed/ Care Coordinated Applicable Imaging Reviewed Interpretation of Laboratory Data incorporated into ED treatment   4:52 PM-Consult complete with hospitalist. Patient case explained and discussed.  She agrees to admit patient for further evaluation and treatment. Call ended at 5:02 PM    Final Clinical Impression(s) / ED Diagnoses Final diagnoses:  Closed fracture of distal end of right femur, unspecified fracture morphology, initial encounter [facility])  Fall, initial encounter    Rx / DC Orders ED Discharge Orders     None        Daleen Bo, MD 03/24/21 1705

## 2021-03-25 ENCOUNTER — Encounter (HOSPITAL_COMMUNITY): Payer: Self-pay | Admitting: Internal Medicine

## 2021-03-25 ENCOUNTER — Inpatient Hospital Stay (HOSPITAL_COMMUNITY): Payer: Medicare PPO | Admitting: Certified Registered"

## 2021-03-25 ENCOUNTER — Inpatient Hospital Stay (HOSPITAL_COMMUNITY): Payer: Medicare PPO

## 2021-03-25 ENCOUNTER — Encounter (HOSPITAL_COMMUNITY)
Admission: EM | Disposition: A | Discharge status: Skilled Nursing Facility | Payer: Self-pay | Source: Home / Self Care | Attending: Internal Medicine

## 2021-03-25 DIAGNOSIS — R609 Edema, unspecified: Secondary | ICD-10-CM

## 2021-03-25 HISTORY — PX: ORIF FEMUR FRACTURE: SHX2119

## 2021-03-25 LAB — BASIC METABOLIC PANEL
Anion gap: 8 (ref 5–15)
BUN: 29 mg/dL — ABNORMAL HIGH (ref 8–23)
CO2: 25 mmol/L (ref 22–32)
Calcium: 8.4 mg/dL — ABNORMAL LOW (ref 8.9–10.3)
Chloride: 108 mmol/L (ref 98–111)
Creatinine, Ser: 1.61 mg/dL — ABNORMAL HIGH (ref 0.44–1.00)
GFR, Estimated: 32 mL/min — ABNORMAL LOW (ref >60)
Glucose, Bld: 241 mg/dL — ABNORMAL HIGH (ref 70–99)
Potassium: 5.1 mmol/L (ref 3.5–5.1)
Sodium: 141 mmol/L (ref 135–145)

## 2021-03-25 LAB — CBC
HCT: 23.4 % — ABNORMAL LOW (ref 36.0–46.0)
HCT: 36 % (ref 36.0–46.0)
Hemoglobin: 7.6 g/dL — ABNORMAL LOW (ref 12.0–15.0)
Hemoglobin: 11.8 g/dL — ABNORMAL LOW (ref 12.0–15.0)
MCH: 32.8 pg (ref 26.0–34.0)
MCH: 31 pg (ref 26.0–34.0)
MCHC: 32.5 g/dL (ref 30.0–36.0)
MCHC: 32.8 g/dL (ref 30.0–36.0)
MCV: 100.9 fL — ABNORMAL HIGH (ref 80.0–100.0)
MCV: 94.5 fL (ref 80.0–100.0)
Platelets: 161 10*3/uL (ref 150–400)
Platelets: 160 10*3/uL (ref 150–400)
RBC: 2.32 MIL/uL — ABNORMAL LOW (ref 3.87–5.11)
RBC: 3.81 MIL/uL — ABNORMAL LOW (ref 3.87–5.11)
RDW: 14.6 % (ref 11.5–15.5)
RDW: 16.3 % — ABNORMAL HIGH (ref 11.5–15.5)
WBC: 7.1 10*3/uL (ref 4.0–10.5)
WBC: 8.6 10*3/uL (ref 4.0–10.5)
nRBC: 0.3 % — ABNORMAL HIGH (ref 0.0–0.2)
nRBC: 0.2 % (ref 0.0–0.2)

## 2021-03-25 LAB — GLUCOSE, CAPILLARY
Glucose-Capillary: 190 mg/dL — ABNORMAL HIGH (ref 70–99)
Glucose-Capillary: 218 mg/dL — ABNORMAL HIGH (ref 70–99)
Glucose-Capillary: 319 mg/dL — ABNORMAL HIGH (ref 70–99)

## 2021-03-25 LAB — TYPE AND SCREEN
ABO/RH(D): O POS
Antibody Screen: NEGATIVE

## 2021-03-25 LAB — CBG MONITORING, ED
Glucose-Capillary: 226 mg/dL — ABNORMAL HIGH (ref 70–99)
Glucose-Capillary: 216 mg/dL — ABNORMAL HIGH (ref 70–99)

## 2021-03-25 LAB — HEMOGLOBIN A1C
Hgb A1c MFr Bld: 8.5 % — ABNORMAL HIGH (ref 4.8–5.6)
Mean Plasma Glucose: 197 mg/dL

## 2021-03-25 SURGERY — OPEN REDUCTION INTERNAL FIXATION (ORIF) DISTAL FEMUR FRACTURE
Anesthesia: General | Laterality: Right

## 2021-03-25 MED ORDER — ONDANSETRON HCL 4 MG/2ML IJ SOLN
INTRAMUSCULAR | Status: DC | PRN
  Administered 2021-03-25: 4 mg via INTRAVENOUS

## 2021-03-25 MED ORDER — SODIUM CHLORIDE 0.9 % IV SOLN: INTRAVENOUS | Status: DC | PRN

## 2021-03-25 MED ORDER — HYDROMORPHONE HCL 1 MG/ML IJ SOLN: 0.2500 mg | INTRAMUSCULAR | Status: DC | PRN

## 2021-03-25 MED ORDER — LACTATED RINGERS IV SOLN: INTRAVENOUS | Status: DC

## 2021-03-25 MED ORDER — ONDANSETRON HCL 4 MG/2ML IJ SOLN: 4.0000 mg | Freq: Four times a day (QID) | INTRAMUSCULAR | Status: DC | PRN

## 2021-03-25 MED ORDER — VANCOMYCIN HCL IN DEXTROSE 1-5 GM/200ML-% IV SOLN
1000.0000 mg | INTRAVENOUS | Status: AC
  Administered 2021-03-25: 1000 mg via INTRAVENOUS
  Filled 2021-03-25: qty 200

## 2021-03-25 MED ORDER — FENTANYL CITRATE (PF) 250 MCG/5ML IJ SOLN
INTRAMUSCULAR | Status: AC
  Filled 2021-03-25: qty 5

## 2021-03-25 MED ORDER — CHLORHEXIDINE GLUCONATE 0.12 % MT SOLN
15.0000 mL | Freq: Once | OROMUCOSAL | Status: DC
  Filled 2021-03-25: qty 15

## 2021-03-25 MED ORDER — POLYETHYLENE GLYCOL 3350 17 G PO PACK: 17.0000 g | PACK | Freq: Every day | ORAL | Status: DC | PRN

## 2021-03-25 MED ORDER — FENTANYL CITRATE (PF) 100 MCG/2ML IJ SOLN: 50.0000 ug | Freq: Once | INTRAMUSCULAR | Status: AC

## 2021-03-25 MED ORDER — METOCLOPRAMIDE HCL 5 MG PO TABS: 5.0000 mg | ORAL_TABLET | Freq: Three times a day (TID) | ORAL | Status: DC | PRN

## 2021-03-25 MED ORDER — VANCOMYCIN HCL 1000 MG IV SOLR
INTRAVENOUS | Status: DC | PRN
  Administered 2021-03-25: 1000 mg via TOPICAL

## 2021-03-25 MED ORDER — SUGAMMADEX SODIUM 200 MG/2ML IV SOLN
INTRAVENOUS | Status: DC | PRN
  Administered 2021-03-25: 200 mg via INTRAVENOUS

## 2021-03-25 MED ORDER — FENTANYL CITRATE (PF) 250 MCG/5ML IJ SOLN
INTRAMUSCULAR | Status: DC | PRN
  Administered 2021-03-25: 100 ug via INTRAVENOUS

## 2021-03-25 MED ORDER — DEXAMETHASONE SODIUM PHOSPHATE 4 MG/ML IJ SOLN
INTRAMUSCULAR | Status: DC | PRN
  Administered 2021-03-25: 4 mg via PERINEURAL

## 2021-03-25 MED ORDER — PROPOFOL 10 MG/ML IV BOLUS
INTRAVENOUS | Status: DC | PRN
  Administered 2021-03-25: 120 mg via INTRAVENOUS

## 2021-03-25 MED ORDER — FENTANYL CITRATE (PF) 100 MCG/2ML IJ SOLN
INTRAMUSCULAR | Status: AC
  Administered 2021-03-25: 50 ug via INTRAVENOUS
  Filled 2021-03-25: qty 2

## 2021-03-25 MED ORDER — VANCOMYCIN HCL 1000 MG/200ML IV SOLN
1000.0000 mg | Freq: Two times a day (BID) | INTRAVENOUS | Status: AC
  Administered 2021-03-26: 1000 mg via INTRAVENOUS
  Filled 2021-03-25 (×2): qty 200

## 2021-03-25 MED ORDER — 0.9 % SODIUM CHLORIDE (POUR BTL) OPTIME
TOPICAL | Status: DC | PRN
  Administered 2021-03-25: 1000 mL

## 2021-03-25 MED ORDER — PHENYLEPHRINE 40 MCG/ML (10ML) SYRINGE FOR IV PUSH (FOR BLOOD PRESSURE SUPPORT)
PREFILLED_SYRINGE | INTRAVENOUS | Status: DC | PRN
Start: 2021-03-25 — End: 2021-03-25
  Administered 2021-03-25 (×2): 200 ug via INTRAVENOUS

## 2021-03-25 MED ORDER — DOCUSATE SODIUM 100 MG PO CAPS
100.0000 mg | ORAL_CAPSULE | Freq: Two times a day (BID) | ORAL | Status: DC
  Administered 2021-03-26 – 2021-03-28 (×6): 100 mg via ORAL
  Filled 2021-03-25 (×6): qty 1

## 2021-03-25 MED ORDER — TRANEXAMIC ACID-NACL 1000-0.7 MG/100ML-% IV SOLN
1000.0000 mg | Freq: Once | INTRAVENOUS | Status: AC
  Administered 2021-03-25: 1000 mg via INTRAVENOUS
  Filled 2021-03-25: qty 100

## 2021-03-25 MED ORDER — VANCOMYCIN HCL 1000 MG IV SOLR
INTRAVENOUS | Status: AC
  Filled 2021-03-25: qty 20

## 2021-03-25 MED ORDER — POVIDONE-IODINE 10 % EX SWAB
2.0000 "application " | Freq: Once | CUTANEOUS | Status: AC
  Administered 2021-03-25: 2 via TOPICAL

## 2021-03-25 MED ORDER — SODIUM CHLORIDE 0.9% IV SOLUTION: Freq: Once | INTRAVENOUS | Status: DC

## 2021-03-25 MED ORDER — SODIUM CHLORIDE 0.9 % IV SOLN: INTRAVENOUS | Status: DC

## 2021-03-25 MED ORDER — ORAL CARE MOUTH RINSE: 15.0000 mL | Freq: Once | OROMUCOSAL | Status: DC

## 2021-03-25 MED ORDER — CLONIDINE HCL (ANALGESIA) 100 MCG/ML EP SOLN
EPIDURAL | Status: DC | PRN
  Administered 2021-03-25: 80 ug

## 2021-03-25 MED ORDER — PHENYLEPHRINE HCL-NACL 20-0.9 MG/250ML-% IV SOLN
INTRAVENOUS | Status: DC | PRN
  Administered 2021-03-25: 40 ug/min via INTRAVENOUS

## 2021-03-25 MED ORDER — ACETAMINOPHEN 325 MG PO TABS: 325.0000 mg | ORAL_TABLET | Freq: Four times a day (QID) | ORAL | Status: DC | PRN

## 2021-03-25 MED ORDER — DROPERIDOL 2.5 MG/ML IJ SOLN: 0.6250 mg | Freq: Once | INTRAMUSCULAR | Status: DC | PRN

## 2021-03-25 MED ORDER — DEXAMETHASONE SODIUM PHOSPHATE 10 MG/ML IJ SOLN
INTRAMUSCULAR | Status: DC | PRN
  Administered 2021-03-25: 5 mg via INTRAVENOUS

## 2021-03-25 MED ORDER — METOCLOPRAMIDE HCL 5 MG/ML IJ SOLN: 5.0000 mg | Freq: Three times a day (TID) | INTRAMUSCULAR | Status: DC | PRN

## 2021-03-25 MED ORDER — LIDOCAINE 2% (20 MG/ML) 5 ML SYRINGE
INTRAMUSCULAR | Status: DC | PRN
  Administered 2021-03-25: 40 mg via INTRAVENOUS

## 2021-03-25 MED ORDER — ENOXAPARIN SODIUM 40 MG/0.4ML IJ SOSY
40.0000 mg | PREFILLED_SYRINGE | INTRAMUSCULAR | Status: DC
  Administered 2021-03-26: 40 mg via SUBCUTANEOUS
  Filled 2021-03-25: qty 0.4

## 2021-03-25 MED ORDER — TRANEXAMIC ACID-NACL 1000-0.7 MG/100ML-% IV SOLN
INTRAVENOUS | Status: AC
  Filled 2021-03-25: qty 100

## 2021-03-25 MED ORDER — TRANEXAMIC ACID-NACL 1000-0.7 MG/100ML-% IV SOLN
1000.0000 mg | INTRAVENOUS | Status: AC
  Administered 2021-03-25: 1000 mg via INTRAVENOUS

## 2021-03-25 MED ORDER — CHLORHEXIDINE GLUCONATE 4 % EX LIQD
60.0000 mL | Freq: Once | CUTANEOUS | Status: AC
  Administered 2021-03-25: 4 via TOPICAL

## 2021-03-25 MED ORDER — ROCURONIUM BROMIDE 10 MG/ML (PF) SYRINGE
PREFILLED_SYRINGE | INTRAVENOUS | Status: DC | PRN
  Administered 2021-03-25: 60 mg via INTRAVENOUS

## 2021-03-25 MED ORDER — ONDANSETRON HCL 4 MG PO TABS: 4.0000 mg | ORAL_TABLET | Freq: Four times a day (QID) | ORAL | Status: DC | PRN

## 2021-03-25 MED ORDER — ROPIVACAINE HCL 7.5 MG/ML IJ SOLN
INTRAMUSCULAR | Status: DC | PRN
  Administered 2021-03-25: 20 mL via PERINEURAL

## 2021-03-25 SURGICAL SUPPLY — 73 items
BAG COUNTER SPONGE SURGICOUNT (BAG) ×2 IMPLANT
BAG SURGICOUNT SPONGE COUNTING (BAG) ×1
BIT DRILL 4.3 (BIT) ×2
BIT DRILL 4.3MM (BIT) ×1
BIT DRILL 4.3X300MM (BIT) IMPLANT
BIT DRILL LONG 3.3 (BIT) ×1 IMPLANT
BIT DRILL LONG 3.3MM (BIT) ×1
BIT DRILL QC 3.3X195 (BIT) ×2 IMPLANT
BLADE CLIPPER SURG (BLADE) IMPLANT
BNDG COHESIVE 6X5 TAN STRL LF (GAUZE/BANDAGES/DRESSINGS) ×3 IMPLANT
BNDG ELASTIC 6X10 VLCR STRL LF (GAUZE/BANDAGES/DRESSINGS) ×3 IMPLANT
BRUSH SCRUB EZ PLAIN DRY (MISCELLANEOUS) ×6 IMPLANT
CANISTER SUCT 3000ML PPV (MISCELLANEOUS) ×3 IMPLANT
CAP LOCK NCB (Cap) ×14 IMPLANT
CHLORAPREP W/TINT 26 (MISCELLANEOUS) ×3 IMPLANT
COVER SURGICAL LIGHT HANDLE (MISCELLANEOUS) ×3 IMPLANT
DERMABOND ADHESIVE PROPEN (GAUZE/BANDAGES/DRESSINGS) ×4
DERMABOND ADVANCED .7 DNX6 (GAUZE/BANDAGES/DRESSINGS) IMPLANT
DRAPE C-ARM 42X72 X-RAY (DRAPES) ×3 IMPLANT
DRAPE C-ARMOR (DRAPES) ×3 IMPLANT
DRAPE HALF SHEET 40X57 (DRAPES) ×6 IMPLANT
DRAPE ORTHO SPLIT 77X108 STRL (DRAPES) ×4
DRAPE SURG 17X23 STRL (DRAPES) ×3 IMPLANT
DRAPE SURG ORHT 6 SPLT 77X108 (DRAPES) ×2 IMPLANT
DRAPE U-SHAPE 47X51 STRL (DRAPES) ×3 IMPLANT
DRSG ADAPTIC 3X8 NADH LF (GAUZE/BANDAGES/DRESSINGS) IMPLANT
DRSG MEPILEX BORDER 4X12 (GAUZE/BANDAGES/DRESSINGS) IMPLANT
DRSG MEPILEX BORDER 4X4 (GAUZE/BANDAGES/DRESSINGS) IMPLANT
DRSG MEPILEX BORDER 4X8 (GAUZE/BANDAGES/DRESSINGS) IMPLANT
DRSG PAD ABDOMINAL 8X10 ST (GAUZE/BANDAGES/DRESSINGS) ×3 IMPLANT
ELECT REM PT RETURN 9FT ADLT (ELECTROSURGICAL) ×3
ELECTRODE REM PT RTRN 9FT ADLT (ELECTROSURGICAL) ×1 IMPLANT
GAUZE SPONGE 4X4 12PLY STRL (GAUZE/BANDAGES/DRESSINGS) ×1 IMPLANT
GLOVE SURG ENC MOIS LTX SZ6.5 (GLOVE) ×9 IMPLANT
GLOVE SURG ENC MOIS LTX SZ7.5 (GLOVE) ×12 IMPLANT
GLOVE SURG UNDER POLY LF SZ6.5 (GLOVE) ×3 IMPLANT
GLOVE SURG UNDER POLY LF SZ7.5 (GLOVE) ×3 IMPLANT
GOWN STRL REUS W/ TWL LRG LVL3 (GOWN DISPOSABLE) ×3 IMPLANT
GOWN STRL REUS W/TWL LRG LVL3 (GOWN DISPOSABLE) ×6
K-WIRE 2.0 (WIRE) ×2
K-WIRE FXSTD 280X2XNS SS (WIRE) ×1
KIT BASIN OR (CUSTOM PROCEDURE TRAY) ×3 IMPLANT
KIT TURNOVER KIT B (KITS) ×3 IMPLANT
KWIRE FXSTD 280X2XNS SS (WIRE) IMPLANT
NS IRRIG 1000ML POUR BTL (IV SOLUTION) ×3 IMPLANT
PACK TOTAL JOINT (CUSTOM PROCEDURE TRAY) ×3 IMPLANT
PAD ARMBOARD 7.5X6 YLW CONV (MISCELLANEOUS) ×6 IMPLANT
PAD CAST 4YDX4 CTTN HI CHSV (CAST SUPPLIES) ×1 IMPLANT
PADDING CAST COTTON 4X4 STRL (CAST SUPPLIES) ×2
PADDING CAST COTTON 6X4 STRL (CAST SUPPLIES) ×3 IMPLANT
PADDING CAST SYN 6 (CAST SUPPLIES) ×2
PADDING CAST SYNTHETIC 6X4 NS (CAST SUPPLIES) IMPLANT
PLATE FEM DIST NCB PP 278MM (Plate) ×2 IMPLANT
SCREW 5.0 80MM (Screw) ×8 IMPLANT
SCREW NCB 3.5X75X5X6.2XST (Screw) IMPLANT
SCREW NCB 4.0MX34M (Screw) ×2 IMPLANT
SCREW NCB 5.0X34MM (Screw) ×2 IMPLANT
SCREW NCB 5.0X38 (Screw) ×2 IMPLANT
SCREW NCB 5.0X75MM (Screw) ×2 IMPLANT
SPONGE T-LAP 18X18 ~~LOC~~+RFID (SPONGE) ×4 IMPLANT
STAPLER VISISTAT 35W (STAPLE) ×3 IMPLANT
SUCTION FRAZIER HANDLE 10FR (MISCELLANEOUS) ×2
SUCTION TUBE FRAZIER 10FR DISP (MISCELLANEOUS) ×1 IMPLANT
SUT ETHILON 3 0 PS 1 (SUTURE) ×6 IMPLANT
SUT VIC AB 0 CT1 27 (SUTURE) ×2
SUT VIC AB 0 CT1 27XBRD ANBCTR (SUTURE) IMPLANT
SUT VIC AB 1 CT1 27 (SUTURE) ×2
SUT VIC AB 1 CT1 27XBRD ANBCTR (SUTURE) IMPLANT
SUT VIC AB 2-0 CT1 27 (SUTURE) ×6
SUT VIC AB 2-0 CT1 TAPERPNT 27 (SUTURE) ×2 IMPLANT
TOWEL GREEN STERILE (TOWEL DISPOSABLE) ×6 IMPLANT
TRAY FOLEY MTR SLVR 16FR STAT (SET/KITS/TRAYS/PACK) IMPLANT
WATER STERILE IRR 1000ML POUR (IV SOLUTION) ×6 IMPLANT

## 2021-03-25 NOTE — Op Note (Signed)
Orthopaedic Surgery Operative Note (CSN: [id number] ) Date of Surgery: 03/25/2021  Admit Date: 03/24/2021   Diagnoses: Pre-Op Diagnoses: Right periprosthetic distal femur fracture  Post-Op Diagnosis: Same  Procedures: CPT 27511-Open reduction internal fixation of right distal femur fracture  Surgeons : Primary: Shona Needles, MD  Assistant: Patrecia Pace, PA-C  Location: OR 3   Anesthesia:General   Antibiotics: Ancef 2g preop with 1 gm vancomycin powder placed topically   Tourniquet time:None    Estimated Blood OZDG:64 mL  Complications:None   Specimens:None   Implants: Implant Name Type Inv. Item Serial No. Manufacturer Lot No. LRB No. Used Action  CAP LOCK NCB - [id number] Cap CAP LOCK NCB [id number] [employer](ORTH,TRAU,[employer]) [id number] Right 1 Implanted  PLATE FEM DIST NCB PP 278MM - [id number] Plate PLATE FEM DIST NCB PP 278MM [id number] [employer](ORTH,TRAU,BIO,SG) [id number] Right 1 Implanted  SCREW NCB 5.0X75MM - [id number] Screw SCREW NCB 5.0X75MM [id number] [employer](ORTH,TRAU,BIO,SG) [id number] Right 1 Implanted  SCREW 5.0 80MM - [id number] Screw SCREW 5.0 80MM [id number] [employer](ORTH,TRAU,BIO,SG) [id number] Right 4 Implanted  SCREW NCB 5.0X38 - [id number] Screw SCREW NCB 5.0X38 [id number] [employer](ORTH,TRAU,BIO,SG) [id number] Right 1 Implanted  SCREW NCB 5.0X38 - [id number] Screw SCREW NCB 5.0X38 [id number] [employer](ORTH,TRAU,BIO,SG) [id number] Right 1 Implanted     Indications for Surgery: 80 year old female who sustained a ground-level fall and had a periprosthetic right distal femur fracture.  Due to the unstable nature of her injury I recommended proceeding with open reduction internal fixation.  Risks and benefits were discussed with the patient.  Risks included but not limited to bleeding, infection, malunion, nonunion, hardware failure, hardware irritation, nerve or blood vessel injury, DVT, even the possibility anesthetic  complications.  She agreed to proceed with surgery and consent was obtained.  Operative Findings: Open reduction internal fixation of right periprosthetic distal femur fracture using [employer] NCB distal femoral locking plate.  Procedure: The patient was identified in the preoperative holding area. Consent was confirmed with the patient and their family and all questions were answered. The operative extremity was marked after confirmation with the patient. she was then brought back to the operating room by our anesthesia colleagues.  She was placed under general anesthetic and carefully transferred over to a radiolucent flat top table.  A bump was placed under her operative hip.  The right lower extremity was then prepped and draped in usual sterile fashion.  A timeout was performed to verify the patient, the procedure, and the extremity.  Preoperative antibiotics were dosed.  Fluoroscopic imaging was obtained to show the unstable nature of her injury.  The hip and knee were flexed over a triangle.  Reduction maneuver was performed to align the fracture appropriately.  I then made a lateral incision directed over the lateral distal femur.  I carried it down through skin and subcutaneous tissue.  I incised through the IT band and reflected the vastus lateralis off the lateral femur to expose the lateral condyle.  A bone hook was used to manipulate the shaft of the femur into the reduction.  I then slid a 12 hole [employer] NCB distal femoral locking plate attached to the targeting arm along the lateral cortex of the femur.  I held provisionally with a K wire distally and then I percutaneously placed a 3.3 mm drill bit to align the proximal portion of the plate.  I then drilled and placed a 5.0 millimeter screws distally to bring the plate flush to bone.  I then percutaneously placed 5.0 millimeter  screws in the femoral shaft to correct the coronal alignment.  A total of 3 screws were placed in the  femoral shaft.  5.0 millimeter screws were placed in the most proximal one was a 4.0 millimeter screw to prevent a stress riser.  Locking caps were placed on all with 5.0 millimeter screws in the femoral shaft.  I returned to the distal segment and placed a total of five 5.0 millimeter screws.  Locking caps were placed on all of the screws.  Final fluoroscopic imaging was then obtained.  The incision was copiously irrigated.  A gram of vancomycin powder was placed into the incision.  A layered closure of 0 Vicryl, 2-0 Vicryl and 3-0 Monocryl with Dermabond was used to close the skin.  Sterile dressing was applied.  The patient was then awoken from anesthesia and taken to the PACU in stable condition.  Post Op Plan/Instructions: Patient will be 50% partial weightbearing on the right lower extremity.  She will receive postoperative antibiotics.  She will receive Lovenox for DVT prophylaxis.  We will have her mobilize with physical and Occupational Therapy.  I was present and performed the entire surgery.  Patrecia Pace, PA-C did assist me throughout the case. An assistant was necessary given the difficulty in approach, maintenance of reduction and ability to instrument the fracture.   Katha Hamming, MD [facility]

## 2021-03-25 NOTE — ED Notes (Signed)
Pt soiled bed and brief with urine, changed and cleaned. Pt placed on purewick.

## 2021-03-25 NOTE — Anesthesia Procedure Notes (Signed)
Procedure Name: Intubation Date/Time: 03/25/2021 6:13 PM Performed by: Reece Agar, CRNA Pre-anesthesia Checklist: Patient identified, Emergency Drugs available, Suction available and Patient being monitored Patient Re-evaluated:Patient Re-evaluated prior to induction Oxygen Delivery Method: Circle System Utilized Preoxygenation: Pre-oxygenation with 100% oxygen Induction Type: IV induction Ventilation: Mask ventilation without difficulty and Oral airway inserted - appropriate to patient size Laryngoscope Size: Mac and 3 Grade View: Grade II Tube type: Oral Tube size: 7.0 mm Number of attempts: 1 Airway Equipment and Method: Stylet and Oral airway Placement Confirmation: ETT inserted through vocal cords under direct vision, positive ETCO2 and breath sounds checked- equal and bilateral Secured at: 22 cm Tube secured with: Tape Dental Injury: Teeth and Oropharynx as per pre-operative assessment

## 2021-03-25 NOTE — Progress Notes (Signed)
Progress Note    Angela Castro   OIN:[id number]  DOB: May 07, 1941  DOA: 03/24/2021     1 PCP: London Pepper, MD  Initial CC: fall out of car  Hospital Course:  Angela Castro is a 80 y.o. female with medical history significant of HTN, HLD, IDDM 2, CKD 3, sent to [facility] by EMS after falling while getting out of the car.  She has a history of right knee replacement ~8 years ago and fell onto her right side.  She underwent xray and CT right knee. Images showed an acutely displaced periprosthetic fracture involving the distal femoral metaphysis.   Her case was discussed with orthopedic surgery and she was recommended for transfer to [facility] for Ortho trauma.  She was placed in a knee immobilizer and put on nonweightbearing restrictions to the right lower extremity.   Interval History:  Seen this morning in the ER still awaiting transfer.  Pain was controlled.  Husband present bedside.  She understands the plan is for transfer for surgical intervention.  Assessment & Plan:  Right distal femur fracture  - s/p mechanical fall getting out of car - continue knee immobilizer; NWB RLE -Pain control -Awaiting transfer to [facility] for Ortho trauma   IDDM Peripheral neuropathy Reports taking lantus 61units qhs at home, reports only check blood glucose a few times a week, but range from 80- 130 at home - lantus adjusted for now - continue neurontin  Hold Januvia and glipizide Check a1c   Bilateral lower extremity pitting edema Reports chronic, was told to elevate legs Never been on diuretics, not wearing compression stocking either Will hold norvasc - LE duplex negative for DVT bilaterally     CKD 3 - patient has history of CKD3b. Baseline creat ~ 1.5 - 1.6, eGFR 34-35 - currently stable and at baseline    Hypertension Continue zebeta, hold norvasc due to edema Hold Cozaar for now, plan to resume post op Prn hydralazine   Hyperlipidemia Continue statin     Gout Stable, continue allopurinol   Physical deconditioning - PT after surgery   Old records reviewed in assessment of this patient  Antimicrobials:   DVT prophylaxis: pending surgery  Code Status:   Code Status: Full Code  Disposition Plan:   Status is: Inpatient   Objective: Blood pressure 114/62, pulse 69, temperature 97.9 F (36.6 C), temperature source Oral, resp. rate 15, SpO2 93 %.  Examination:  Physical Exam Constitutional:      Appearance: Normal appearance.  HENT:     Head: Normocephalic and atraumatic.     Mouth/Throat:     Mouth: Mucous membranes are moist.  Eyes:     Extraocular Movements: Extraocular movements intact.  Cardiovascular:     Rate and Rhythm: Normal rate and regular rhythm.  Pulmonary:     Effort: Pulmonary effort is normal.     Breath sounds: Normal breath sounds.  Abdominal:     General: Bowel sounds are normal. There is no distension.     Palpations: Abdomen is soft.     Tenderness: There is no abdominal tenderness.  Musculoskeletal:     Cervical back: Normal range of motion and neck supple.     Comments: RLE noted in immobilizer. Equal B/L LE 1-2+ edema  Skin:    General: Skin is warm and dry.  Neurological:     Mental Status: She is alert and oriented to person, place, and time.  Psychiatric:        Mood  and Affect: Mood normal.        Behavior: Behavior normal.     Consultants:  Orthopedic  surgery  Procedures:    Data Reviewed: I have personally reviewed labs and imaging studies     LOS: 1 day  Time spent: Greater than 50% of the 35 minute visit was spent in counseling/coordination of care for the patient as laid out in the A&P.   Dwyane Dee, MD [facility] 03/25/2021, 1:01 PM

## 2021-03-25 NOTE — Anesthesia Postprocedure Evaluation (Signed)
Anesthesia Post Note  Patient: Angela Castro  Procedure(s) Performed: OPEN REDUCTION INTERNAL FIXATION (ORIF) DISTAL FEMUR FRACTURE (Right)     Patient location during evaluation: PACU Anesthesia Type: General Level of consciousness: awake and alert, patient cooperative and oriented Pain management: pain level controlled Vital Signs Assessment: post-procedure vital signs reviewed and stable Respiratory status: spontaneous breathing, nonlabored ventilation and respiratory function stable Cardiovascular status: blood pressure returned to baseline and stable Postop Assessment: no apparent nausea or vomiting Anesthetic complications: no   No notable events documented.  Last Vitals:  Vitals:   03/25/21 2005 03/25/21 2015  BP: 132/65 129/70  Pulse: 86 85  Resp: 16 14  Temp:  36.6 C  SpO2: 95% 94%    Last Pain:  Vitals:   03/25/21 2015  TempSrc: Oral  PainSc: 0-No pain                 Ona Rathert,E. Ismaeel Arvelo

## 2021-03-25 NOTE — Transfer of Care (Signed)
Immediate Anesthesia Transfer of Care Note  Patient: Angela Castro  Procedure(s) Performed: OPEN REDUCTION INTERNAL FIXATION (ORIF) DISTAL FEMUR FRACTURE (Right)  Patient Location: PACU  Anesthesia Type:General  Level of Consciousness: awake, alert  and oriented  Airway & Oxygen Therapy: Patient Spontanous Breathing and Patient connected to face mask oxygen  Post-op Assessment: Report given to RN and Post -op Vital signs reviewed and stable  Post vital signs: Reviewed and stable  Last Vitals:  Vitals Value Taken Time  BP 129/68 03/25/21 1933  Temp    Pulse 87 03/25/21 1937  Resp 16 03/25/21 1937  SpO2 99 % 03/25/21 1937  Vitals shown include unvalidated device data.  Last Pain:  Vitals:   03/25/21 1536  TempSrc: Oral  PainSc: 0-No pain         Complications: No notable events documented.

## 2021-03-25 NOTE — Interval H&P Note (Signed)
History and Physical Interval Note:  03/25/2021 4:42 PM  Angela Castro  has presented today for surgery, with the diagnosis of Right periprosthetic distal femur fracture.  The various methods of treatment have been discussed with the patient and family. After consideration of risks, benefits and other options for treatment, the patient has consented to  Procedure(s): OPEN REDUCTION INTERNAL FIXATION (ORIF) DISTAL FEMUR FRACTURE (Right) as a surgical intervention.  The patient's history has been reviewed, patient examined, no change in status, stable for surgery.  I have reviewed the patient's chart and labs.  Questions were answered to the patient's satisfaction.     Lennette Bihari P Dejuana Weist

## 2021-03-25 NOTE — Anesthesia Preprocedure Evaluation (Addendum)
Anesthesia Evaluation  Patient identified by MRN, date of birth, ID band Patient awake    Reviewed: Allergy & Precautions, NPO status , Patient's Chart, lab work & pertinent test results  Airway Mallampati: II  TM Distance: >3 FB Neck ROM: Full    Dental  (+) Dental Advisory Given, Teeth Intact   Pulmonary asthma ,    Pulmonary exam normal breath sounds clear to auscultation       Cardiovascular hypertension, Pt. on home beta blockers and Pt. on medications Normal cardiovascular exam Rhythm:Regular Rate:Normal     Neuro/Psych negative neurological ROS     GI/Hepatic negative GI ROS, Neg liver ROS,   Endo/Other  diabetes  Renal/GU Renal disease     Musculoskeletal  (+) Arthritis ,   Abdominal (+) + obese,   Peds  Hematology negative hematology ROS (+)   Anesthesia Other Findings   Reproductive/Obstetrics                            Anesthesia Physical  Anesthesia Plan  ASA: 3  Anesthesia Plan: General   Post-op Pain Management: GA combined w/ Regional for post-op pain   Induction: Intravenous  PONV Risk Score and Plan: 3 and Ondansetron, Dexamethasone, Treatment may vary due to age or medical condition and Diphenhydramine  Airway Management Planned: Oral ETT  Additional Equipment: None  Intra-op Plan:   Post-operative Plan: Extubation in OR  Informed Consent: I have reviewed the patients History and Physical, chart, labs and discussed the procedure including the risks, benefits and alternatives for the proposed anesthesia with the patient or authorized representative who has indicated his/her understanding and acceptance.     Dental advisory given  Plan Discussed with: CRNA  Anesthesia Plan Comments:        Anesthesia Quick Evaluation

## 2021-03-25 NOTE — Anesthesia Procedure Notes (Signed)
Anesthesia Regional Block: Adductor canal block   Pre-Anesthetic Checklist: , timeout performed,  Correct Patient, Correct Site, Correct Laterality,  Correct Procedure, Correct Position, site marked,  Risks and benefits discussed,  Surgical consent,  Pre-op evaluation,  At surgeon's request and post-op pain management  Laterality: Lower and Right  Prep: chloraprep       Needles:  Injection technique: Single-shot  Needle Type: Stimiplex     Needle Length: 9cm  Needle Gauge: 21     Additional Needles:   Procedures:,,,, ultrasound used (permanent image in chart),,    Narrative:  Start time: 03/25/2021 4:25 PM End time: 03/25/2021 4:45 PM Injection made incrementally with aspirations every 5 mL.  Performed by: Personally  Anesthesiologist: Nolon Nations, MD  Additional Notes: BP cuff, EKG monitors applied. Sedation begun. Artery and nerve location verified with ultrasound. Anesthetic injected incrementally (19ml), slowly, and after negative aspirations under direct u/s guidance. Good fascial/perineural spread. Tolerated well.

## 2021-03-25 NOTE — Consult Note (Signed)
Reason for Consult:Right distal femur fx Referring Physician: Dwyane Dee Time called: 1532 Time at bedside: [place] is an 80 y.o. female.  HPI: Perrin was getting out of her car when she thinks her right knee gave way and she fell. She had immediate knee pain and could not get up or bear weight. She was brought to the ED where x-rays showed a distal femur fx and orthopedic surgery was consulted. She lives with her husband and generally ambulates with a RW at baseline.  Past Medical History:  Diagnosis Date   Asthma    Diabetes mellitus without complication ([place])    Hypertension     Past Surgical History:  Procedure Laterality Date   AMPUTATION Left 08/26/2016   Procedure: AMPUTATION RAY/1ST;  Surgeon: Wylene Simmer, MD;  Location: [place];  Service: Orthopedics;  Laterality: Left;   IR GENERIC HISTORICAL  07/30/2016   IR US GUIDE VASC ACCESS RIGHT 07/30/2016 Markus Daft, MD [facility]   IR GENERIC HISTORICAL  07/30/2016   IR FLUORO GUIDE CV MIDLINE PICC RIGHT 07/30/2016 Markus Daft, MD [facility]   KNEE ARTHROSCOPY     x 4   REPLACEMENT TOTAL KNEE      History reviewed. No pertinent family history.  Social History:  reports that she has never smoked. She has never used smokeless tobacco. She reports that she does not drink alcohol and does not use drugs.  Allergies:  Allergies  Allergen Reactions   Sulfa Antibiotics Shortness Of Breath and Rash   Adhesive [Tape] Other (See Comments)    Causes redness, please use paper tape   Keflex [Cephalexin] Other (See Comments)    Possibly caused lethargy (taken with Flagyl)   Lisinopril Cough   Metronidazole Other (See Comments)    Possibly caused lethargy (taken with keflex)   Teflaro [Ceftaroline] Other (See Comments)    Lethargy, possibly rash    Medications: I have reviewed the patient's current medications.  Results for orders placed or performed during the hospital encounter of 03/24/21 (from the past 48 hour(s))   Comprehensive metabolic panel     Status: Abnormal   Collection Time: 03/24/21  3:45 PM  Result Value Ref Range   Sodium 142 135 - 145 mmol/L   Potassium 4.5 3.5 - 5.1 mmol/L   Chloride 108 98 - 111 mmol/L   CO2 28 22 - 32 mmol/L   Glucose, Bld 226 (H) 70 - 99 mg/dL    Comment: Glucose reference range applies only to samples taken after fasting for at least 8 hours.   BUN 24 (H) 8 - 23 mg/dL   Creatinine, Ser 1.53 (H) 0.44 - 1.00 mg/dL   Calcium 8.8 (L) 8.9 - 10.3 mg/dL   Total Protein 6.7 6.5 - 8.1 g/dL   Albumin 3.7 3.5 - 5.0 g/dL   AST 22 15 - 41 U/L   ALT 17 0 - 44 U/L   Alkaline Phosphatase 83 38 - 126 U/L   Total Bilirubin 0.5 0.3 - 1.2 mg/dL   GFR, Estimated 34 (L) >60 mL/min    Comment: (NOTE) Calculated using the CKD-EPI Creatinine Equation (2021)    Anion gap 6 5 - 15    Comment: Performed at [facility], [place] [address] [place], [place] [address]  CBC with Differential     Status: Abnormal   Collection Time: 03/24/21  3:45 PM  Result Value Ref Range   WBC 11.4 (H) 4.0 - 10.5 K/uL   RBC  3.30 (L) 3.87 - 5.11 MIL/uL   Hemoglobin 10.7 (L) 12.0 - 15.0 g/dL   HCT 32.4 (L) 36.0 - 46.0 %   MCV 98.2 80.0 - 100.0 fL   MCH 32.4 26.0 - 34.0 pg   MCHC 33.0 30.0 - 36.0 g/dL   RDW 14.6 11.5 - 15.5 %   Platelets 233 150 - 400 K/uL   nRBC 0.0 0.0 - 0.2 %   Neutrophils Relative % 79 %   Neutro Abs 9.0 (H) 1.7 - 7.7 K/uL   Lymphocytes Relative 10 %   Lymphs Abs 1.2 0.7 - 4.0 K/uL   Monocytes Relative 6 %   Monocytes Absolute 0.7 0.1 - 1.0 K/uL   Eosinophils Relative 4 %   Eosinophils Absolute 0.4 0.0 - 0.5 K/uL   Basophils Relative 1 %   Basophils Absolute 0.1 0.0 - 0.1 K/uL   Immature Granulocytes 0 %   Abs Immature Granulocytes 0.04 0.00 - 0.07 K/uL    Comment: Performed at [facility], [place] [address], [place], [place] [address]  Resp Panel by RT-PCR (Flu A&B, Covid) Nasopharyngeal Swab     Status: None   Collection Time: 03/24/21   3:45 PM   Specimen: Nasopharyngeal Swab; Nasopharyngeal(NP) swabs in vial transport medium  Result Value Ref Range   SARS Coronavirus 2 by RT PCR NEGATIVE NEGATIVE    Comment: (NOTE) SARS-CoV-2 target nucleic acids are NOT DETECTED.  The SARS-CoV-2 RNA is generally detectable in upper respiratory specimens during the acute phase of infection. The lowest concentration of SARS-CoV-2 viral copies this assay can detect is 138 copies/mL. A negative result does not preclude SARS-Cov-2 infection and should not be used as the sole basis for treatment or other patient management decisions. A negative result may occur with  improper specimen collection/handling, submission of specimen other than nasopharyngeal swab, presence of viral mutation(s) within the areas targeted by this assay, and inadequate number of viral copies(<138 copies/mL). A negative result must be combined with clinical observations, patient history, and epidemiological information. The expected result is Negative.  Fact Sheet for Patients:  EntrepreneurPulse.com.au  Fact Sheet for Healthcare Providers:  IncredibleEmployment.be  This test is no t yet approved or cleared by the [place] FDA and  has been authorized for detection and/or diagnosis of SARS-CoV-2 by FDA under an Emergency Use Authorization (EUA). This EUA will remain  in effect (meaning this test can be used) for the duration of the COVID-19 declaration under Section 564(b)(1) of the Act, 21 U.S.C.section 360bbb-3(b)(1), unless the authorization is terminated  or revoked sooner.       Influenza A by PCR NEGATIVE NEGATIVE   Influenza B by PCR NEGATIVE NEGATIVE    Comment: (NOTE) The Xpert Xpress SARS-CoV-2/FLU/RSV plus assay is intended as an aid in the diagnosis of influenza from Nasopharyngeal swab specimens and should not be used as a sole basis for treatment. Nasal washings and aspirates are unacceptable for  Xpert Xpress SARS-CoV-2/FLU/RSV testing.  Fact Sheet for Patients: EntrepreneurPulse.com.au  Fact Sheet for Healthcare Providers: IncredibleEmployment.be  This test is not yet approved or cleared by the [place] FDA and has been authorized for detection and/or diagnosis of SARS-CoV-2 by FDA under an Emergency Use Authorization (EUA). This EUA will remain in effect (meaning this test can be used) for the duration of the COVID-19 declaration under Section 564(b)(1) of the Act, 21 U.S.C. section 360bbb-3(b)(1), unless the authorization is terminated or revoked.  Performed at [facility], [place] [address] [place], [place] [address]   CBG monitoring, ED     Status: Abnormal   Collection Time: 03/24/21 11:10 PM  Result Value Ref Range   Glucose-Capillary 242 (H) 70 - 99 mg/dL    Comment: Glucose reference range applies only to samples taken after fasting for at least 8 hours.  CBC     Status: Abnormal   Collection Time: 03/25/21  4:20 AM  Result Value Ref Range   WBC 7.1 4.0 - 10.5 K/uL   RBC 2.32 (L) 3.87 - 5.11 MIL/uL   Hemoglobin 7.6 (L) 12.0 - 15.0 g/dL    Comment: DELTA CHECK NOTED   HCT 23.4 (L) 36.0 - 46.0 %   MCV 100.9 (H) 80.0 - 100.0 fL   MCH 32.8 26.0 - 34.0 pg   MCHC 32.5 30.0 - 36.0 g/dL   RDW 14.6 11.5 - 15.5 %   Platelets 161 150 - 400 K/uL   nRBC 0.3 (H) 0.0 - 0.2 %    Comment: Performed at [facility], [place] [address] [place], [place] [address]  Basic metabolic panel     Status: Abnormal   Collection Time: 03/25/21  4:20 AM  Result Value Ref Range   Sodium 141 135 - 145 mmol/L   Potassium 5.1 3.5 - 5.1 mmol/L   Chloride 108 98 - 111 mmol/L   CO2 25 22 - 32 mmol/L   Glucose, Bld 241 (H) 70 - 99 mg/dL    Comment: Glucose reference range applies only to samples taken after fasting for at least 8 hours.   BUN 29 (H) 8 - 23 mg/dL   Creatinine, Ser 1.61 (H) 0.44 - 1.00 mg/dL    Calcium 8.4 (L) 8.9 - 10.3 mg/dL   GFR, Estimated 32 (L) >60 mL/min    Comment: (NOTE) Calculated using the CKD-EPI Creatinine Equation (2021)    Anion gap 8 5 - 15    Comment: Performed at [facility], [place] [address], [place], [place] [address]  CBG monitoring, ED     Status: Abnormal   Collection Time: 03/25/21  8:02 AM  Result Value Ref Range   Glucose-Capillary 226 (H) 70 - 99 mg/dL    Comment: Glucose reference range applies only to samples taken after fasting for at least 8 hours.  Type and screen [place]     Status: None   Collection Time: 03/25/21 11:10 AM  Result Value Ref Range   ABO/RH(D) O POS    Antibody Screen NEG    Sample Expiration      03/28/2021,2359 Performed at [facility], [place] [address], [place], [place] [address]   CBG monitoring, ED     Status: Abnormal   Collection Time: 03/25/21 12:52 PM  Result Value Ref Range   Glucose-Capillary 216 (H) 70 - 99 mg/dL    Comment: Glucose reference range applies only to samples taken after fasting for at least 8 hours.  Glucose, capillary     Status: Abnormal   Collection Time: 03/25/21  3:26 PM  Result Value Ref Range   Glucose-Capillary 190 (H) 70 - 99 mg/dL    Comment: Glucose reference range applies only to samples taken after fasting for at least 8 hours.    CT KNEE RIGHT WO CONTRAST  Result Date: 03/24/2021 CLINICAL DATA:  Right knee periprosthetic femur fracture. EXAM: CT OF THE RIGHT KNEE WITHOUT CONTRAST TECHNIQUE: Multidetector CT imaging of the right knee was performed according to the standard protocol.  Multiplanar CT image reconstructions were also generated. COMPARISON:  Right knee x-rays from same day. FINDINGS: Bones/Joint/Cartilage Right total knee arthroplasty. Acute comminuted periprosthetic fracture of the distal femoral metaphysis again noted with up to 2.5 cm lateral displacement, 1.9 cm posterior displacement, and 1.9 cm overriding.  There is a nondisplaced longitudinal component extending superiorly into the diaphysis along the posterior cortex (series 604, image 24). No dislocation. No joint effusion. Ligaments Ligaments are suboptimally evaluated by CT. Muscles and Tendons Grossly intact. Moderate atrophy of the visualized lower leg muscles below the knee. Soft tissue Diffuse soft tissue swelling. No fluid collection or hematoma. No soft tissue mass. IMPRESSION: 1. Acute comminuted and displaced periprosthetic fracture of the distal femoral metaphysis as described above. Electronically Signed   By: Titus Dubin M.D.   On: 03/24/2021 16:47   DG Knee Complete 4 Views Right  Result Date: 03/24/2021 CLINICAL DATA:  Right knee pain after fall. EXAM: RIGHT KNEE - COMPLETE 4+ VIEW COMPARISON:  None. FINDINGS: Right total knee arthroplasty. No periprosthetic lucency. Acute periprosthetic fracture of the distal femoral metaphysis with 3.9 cm lateral displacement, 1.8 cm posterior displacement, and 1.8 cm of overriding. No dislocation. Diffuse soft tissue swelling. IMPRESSION: 1. Acute displaced periprosthetic fracture of the distal femoral metaphysis. Electronically Signed   By: Titus Dubin M.D.   On: 03/24/2021 15:34   VAS [place] LOWER EXTREMITY VENOUS (DVT)  Result Date: 03/25/2021  Lower Venous DVT Study Patient Name:  LEANDA PADMORE  Date of Exam:   03/25/2021 Medical Rec #: [id number]       Accession #:    [id number] Date of Birth: 04/12/1941       Patient Gender: F Patient Age:   89 years Exam Location:  [facility] Procedure:      VAS [place] LOWER EXTREMITY VENOUS (DVT) Referring Phys: Annamaria Boots XU --------------------------------------------------------------------------------  Indications: Edema.  Risk Factors: Trauma Fall with RLE femur fracture 03/24/2021. Limitations: Poor ultrasound/tissue interface and body habitus. Comparison Study: No previous exams Performing Technologist: Jody Hill RVT, RDMS  Examination Guidelines: A  complete evaluation includes B-mode imaging, spectral Doppler, color Doppler, and power Doppler as needed of all accessible portions of each vessel. Bilateral testing is considered an integral part of a complete examination. Limited examinations for reoccurring indications may be performed as noted. The reflux portion of the exam is performed with the patient in reverse Trendelenburg.  +---------+---------------+---------+-----------+----------+-------------------+ RIGHT    CompressibilityPhasicitySpontaneityPropertiesThrombus Aging      +---------+---------------+---------+-----------+----------+-------------------+ CFV      Full           Yes      Yes                                      +---------+---------------+---------+-----------+----------+-------------------+ SFJ      Full                                                             +---------+---------------+---------+-----------+----------+-------------------+ FV Prox  Full           Yes      Yes                                      +---------+---------------+---------+-----------+----------+-------------------+  FV Mid   Full           Yes      Yes                                      +---------+---------------+---------+-----------+----------+-------------------+ FV DistalFull           Yes      Yes                                      +---------+---------------+---------+-----------+----------+-------------------+ PFV      Full                                                             +---------+---------------+---------+-----------+----------+-------------------+ POP      Full           Yes      Yes                                      +---------+---------------+---------+-----------+----------+-------------------+ PTV      Full                                         Not well visualized +---------+---------------+---------+-----------+----------+-------------------+ PERO     Full                                                              +---------+---------------+---------+-----------+----------+-------------------+   +---------+---------------+---------+-----------+----------+--------------+ LEFT     CompressibilityPhasicitySpontaneityPropertiesThrombus Aging +---------+---------------+---------+-----------+----------+--------------+ CFV      Full           Yes      Yes                                 +---------+---------------+---------+-----------+----------+--------------+ SFJ      Full                                                        +---------+---------------+---------+-----------+----------+--------------+ FV Prox  Full           Yes      Yes                                 +---------+---------------+---------+-----------+----------+--------------+ FV Mid   Full           Yes      Yes                                 +---------+---------------+---------+-----------+----------+--------------+  FV DistalFull           Yes      Yes                                 +---------+---------------+---------+-----------+----------+--------------+ PFV      Full                                                        +---------+---------------+---------+-----------+----------+--------------+ POP      Full           Yes      Yes                                 +---------+---------------+---------+-----------+----------+--------------+ PTV      Full                                                        +---------+---------------+---------+-----------+----------+--------------+ PERO     Full                                                        +---------+---------------+---------+-----------+----------+--------------+     Summary: BILATERAL: - No evidence of deep vein thrombosis seen in the lower extremities, bilaterally. - No evidence of superficial venous thrombosis in the lower extremities, bilaterally. -No evidence of  popliteal cyst, bilaterally.   *See table(s) above for measurements and observations. Electronically signed by Monica Martinez MD on 03/25/2021 at 12:01:50 PM.    Final     Review of Systems  HENT:  Negative for ear discharge, ear pain, hearing loss and tinnitus.   Eyes:  Negative for photophobia and pain.  Respiratory:  Negative for cough and shortness of breath.   Cardiovascular:  Negative for chest pain.  Gastrointestinal:  Negative for abdominal pain, nausea and vomiting.  Genitourinary:  Negative for dysuria, flank pain, frequency and urgency.  Musculoskeletal:  Positive for arthralgias (Right knee). Negative for back pain, myalgias and neck pain.  Neurological:  Negative for dizziness and headaches.  Hematological:  Does not bruise/bleed easily.  Psychiatric/Behavioral:  The patient is not nervous/anxious.   Blood pressure 119/68, pulse 85, temperature 97.9 F (36.6 C), temperature source Oral, resp. rate 16, SpO2 94 %. Physical Exam Constitutional:      General: She is not in acute distress.    Appearance: She is well-developed. She is not diaphoretic.  HENT:     Head: Normocephalic and atraumatic.  Eyes:     General: No scleral icterus.       Right eye: No discharge.        Left eye: No discharge.     Conjunctiva/sclera: Conjunctivae normal.  Cardiovascular:     Rate and Rhythm: Normal rate and regular rhythm.  Pulmonary:     Effort: Pulmonary effort is normal. No respiratory distress.  Musculoskeletal:     Cervical back: Normal range of  motion.     Comments: RLE No traumatic wounds, ecchymosis, or rash  Mod TTP knee  No ankle effusion  Sens DPN, SPN, TN paresthetic  Motor EHL, ext, flex, evers 5/5  DP 1+, PT 0, No significant edema  Skin:    General: Skin is warm and dry.  Neurological:     Mental Status: She is alert.  Psychiatric:        Mood and Affect: Mood normal.        Behavior: Behavior normal.    Assessment/Plan: Right distal femur fx -- Plan ORIF  today with Dr. Doreatha Martin. Please keep NPO. Multiple medical problems including HTN, HLD, IDDM 2, and CKD 3 -- per primary service    Lisette Abu, PA-C Orthopedic Surgery [phone] 03/25/2021, 3:44 PM

## 2021-03-25 NOTE — Progress Notes (Signed)
BLE venous duplex has been completed.     Results can be found under chart review under CV PROC. 03/25/2021 11:09 AM Laria Grimmett RVT, RDMS

## 2021-03-25 NOTE — H&P (View-Only) (Signed)
Reason for Consult:Right distal femur fx Referring Physician: Dwyane Dee Time called: 1532 Time at bedside: [place] is an 80 y.o. female.  HPI: Angela Castro was getting out of her car when she thinks her right knee gave way and she fell. She had immediate knee pain and could not get up or bear weight. She was brought to the ED where x-rays showed a distal femur fx and orthopedic surgery was consulted. She lives with her husband and generally ambulates with a RW at baseline.  Past Medical History:  Diagnosis Date   Asthma    Diabetes mellitus without complication ([place])    Hypertension     Past Surgical History:  Procedure Laterality Date   AMPUTATION Left 08/26/2016   Procedure: AMPUTATION RAY/1ST;  Surgeon: Wylene Simmer, MD;  Location: [facility];  Service: Orthopedics;  Laterality: Left;   IR GENERIC HISTORICAL  07/30/2016   IR US GUIDE VASC ACCESS RIGHT 07/30/2016 Markus Daft, MD [facility]   IR GENERIC HISTORICAL  07/30/2016   IR FLUORO GUIDE CV MIDLINE PICC RIGHT 07/30/2016 Markus Daft, MD [facility]   KNEE ARTHROSCOPY     x 4   REPLACEMENT TOTAL KNEE      History reviewed. No pertinent family history.  Social History:  reports that she has never smoked. She has never used smokeless tobacco. She reports that she does not drink alcohol and does not use drugs.  Allergies:  Allergies  Allergen Reactions   Sulfa Antibiotics Shortness Of Breath and Rash   Adhesive [Tape] Other (See Comments)    Causes redness, please use paper tape   Keflex [Cephalexin] Other (See Comments)    Possibly caused lethargy (taken with Flagyl)   Lisinopril Cough   Metronidazole Other (See Comments)    Possibly caused lethargy (taken with keflex)   Teflaro [Ceftaroline] Other (See Comments)    Lethargy, possibly rash    Medications: I have reviewed the patient's current medications.  Results for orders placed or performed during the hospital encounter of 03/24/21 (from the past 48 hour(s))   Comprehensive metabolic panel     Status: Abnormal   Collection Time: 03/24/21  3:45 PM  Result Value Ref Range   Sodium 142 135 - 145 mmol/L   Potassium 4.5 3.5 - 5.1 mmol/L   Chloride 108 98 - 111 mmol/L   CO2 28 22 - 32 mmol/L   Glucose, Bld 226 (H) 70 - 99 mg/dL    Comment: Glucose reference range applies only to samples taken after fasting for at least 8 hours.   BUN 24 (H) 8 - 23 mg/dL   Creatinine, Ser 1.53 (H) 0.44 - 1.00 mg/dL   Calcium 8.8 (L) 8.9 - 10.3 mg/dL   Total Protein 6.7 6.5 - 8.1 g/dL   Albumin 3.7 3.5 - 5.0 g/dL   AST 22 15 - 41 U/L   ALT 17 0 - 44 U/L   Alkaline Phosphatase 83 38 - 126 U/L   Total Bilirubin 0.5 0.3 - 1.2 mg/dL   GFR, Estimated 34 (L) >60 mL/min    Comment: (NOTE) Calculated using the CKD-EPI Creatinine Equation (2021)    Anion gap 6 5 - 15    Comment: Performed at [facility], [place] [address], [place], [place] [address]  CBC with Differential     Status: Abnormal   Collection Time: 03/24/21  3:45 PM  Result Value Ref Range   WBC 11.4 (H) 4.0 - 10.5 K/uL   RBC  3.30 (L) 3.87 - 5.11 MIL/uL   Hemoglobin 10.7 (L) 12.0 - 15.0 g/dL   HCT 32.4 (L) 36.0 - 46.0 %   MCV 98.2 80.0 - 100.0 fL   MCH 32.4 26.0 - 34.0 pg   MCHC 33.0 30.0 - 36.0 g/dL   RDW 14.6 11.5 - 15.5 %   Platelets 233 150 - 400 K/uL   nRBC 0.0 0.0 - 0.2 %   Neutrophils Relative % 79 %   Neutro Abs 9.0 (H) 1.7 - 7.7 K/uL   Lymphocytes Relative 10 %   Lymphs Abs 1.2 0.7 - 4.0 K/uL   Monocytes Relative 6 %   Monocytes Absolute 0.7 0.1 - 1.0 K/uL   Eosinophils Relative 4 %   Eosinophils Absolute 0.4 0.0 - 0.5 K/uL   Basophils Relative 1 %   Basophils Absolute 0.1 0.0 - 0.1 K/uL   Immature Granulocytes 0 %   Abs Immature Granulocytes 0.04 0.00 - 0.07 K/uL    Comment: Performed at [facility], [place] [address], [place], [place] [address]  Resp Panel by RT-PCR (Flu A&B, Covid) Nasopharyngeal Swab     Status: None   Collection Time: 03/24/21   3:45 PM   Specimen: Nasopharyngeal Swab; Nasopharyngeal(NP) swabs in vial transport medium  Result Value Ref Range   SARS Coronavirus 2 by RT PCR NEGATIVE NEGATIVE    Comment: (NOTE) SARS-CoV-2 target nucleic acids are NOT DETECTED.  The SARS-CoV-2 RNA is generally detectable in upper respiratory specimens during the acute phase of infection. The lowest concentration of SARS-CoV-2 viral copies this assay can detect is 138 copies/mL. A negative result does not preclude SARS-Cov-2 infection and should not be used as the sole basis for treatment or other patient management decisions. A negative result may occur with  improper specimen collection/handling, submission of specimen other than nasopharyngeal swab, presence of viral mutation(s) within the areas targeted by this assay, and inadequate number of viral copies(<138 copies/mL). A negative result must be combined with clinical observations, patient history, and epidemiological information. The expected result is Negative.  Fact Sheet for Patients:  EntrepreneurPulse.com.au  Fact Sheet for Healthcare Providers:  IncredibleEmployment.be  This test is no t yet approved or cleared by the [place] FDA and  has been authorized for detection and/or diagnosis of SARS-CoV-2 by FDA under an Emergency Use Authorization (EUA). This EUA will remain  in effect (meaning this test can be used) for the duration of the COVID-19 declaration under Section 564(b)(1) of the Act, 21 U.S.C.section 360bbb-3(b)(1), unless the authorization is terminated  or revoked sooner.       Influenza A by PCR NEGATIVE NEGATIVE   Influenza B by PCR NEGATIVE NEGATIVE    Comment: (NOTE) The Xpert Xpress SARS-CoV-2/FLU/RSV plus assay is intended as an aid in the diagnosis of influenza from Nasopharyngeal swab specimens and should not be used as a sole basis for treatment. Nasal washings and aspirates are unacceptable for  Xpert Xpress SARS-CoV-2/FLU/RSV testing.  Fact Sheet for Patients: EntrepreneurPulse.com.au  Fact Sheet for Healthcare Providers: IncredibleEmployment.be  This test is not yet approved or cleared by the [place] FDA and has been authorized for detection and/or diagnosis of SARS-CoV-2 by FDA under an Emergency Use Authorization (EUA). This EUA will remain in effect (meaning this test can be used) for the duration of the COVID-19 declaration under Section 564(b)(1) of the Act, 21 U.S.C. section 360bbb-3(b)(1), unless the authorization is terminated or revoked.  Performed at [facility], [place]  [address] [place], [place] [address]   CBG monitoring, ED     Status: Abnormal   Collection Time: 03/24/21 11:10 PM  Result Value Ref Range   Glucose-Capillary 242 (H) 70 - 99 mg/dL    Comment: Glucose reference range applies only to samples taken after fasting for at least 8 hours.  CBC     Status: Abnormal   Collection Time: 03/25/21  4:20 AM  Result Value Ref Range   WBC 7.1 4.0 - 10.5 K/uL   RBC 2.32 (L) 3.87 - 5.11 MIL/uL   Hemoglobin 7.6 (L) 12.0 - 15.0 g/dL    Comment: DELTA CHECK NOTED   HCT 23.4 (L) 36.0 - 46.0 %   MCV 100.9 (H) 80.0 - 100.0 fL   MCH 32.8 26.0 - 34.0 pg   MCHC 32.5 30.0 - 36.0 g/dL   RDW 14.6 11.5 - 15.5 %   Platelets 161 150 - 400 K/uL   nRBC 0.3 (H) 0.0 - 0.2 %    Comment: Performed at [facility], [place] [address], [place], [place] [address]  Basic metabolic panel     Status: Abnormal   Collection Time: 03/25/21  4:20 AM  Result Value Ref Range   Sodium 141 135 - 145 mmol/L   Potassium 5.1 3.5 - 5.1 mmol/L   Chloride 108 98 - 111 mmol/L   CO2 25 22 - 32 mmol/L   Glucose, Bld 241 (H) 70 - 99 mg/dL    Comment: Glucose reference range applies only to samples taken after fasting for at least 8 hours.   BUN 29 (H) 8 - 23 mg/dL   Creatinine, Ser 1.61 (H) 0.44 - 1.00 mg/dL    Calcium 8.4 (L) 8.9 - 10.3 mg/dL   GFR, Estimated 32 (L) >60 mL/min    Comment: (NOTE) Calculated using the CKD-EPI Creatinine Equation (2021)    Anion gap 8 5 - 15    Comment: Performed at [facility], [place] [address], [place], [place] [address]  CBG monitoring, ED     Status: Abnormal   Collection Time: 03/25/21  8:02 AM  Result Value Ref Range   Glucose-Capillary 226 (H) 70 - 99 mg/dL    Comment: Glucose reference range applies only to samples taken after fasting for at least 8 hours.  Type and screen [place]     Status: None   Collection Time: 03/25/21 11:10 AM  Result Value Ref Range   ABO/RH(D) O POS    Antibody Screen NEG    Sample Expiration      03/28/2021,2359 Performed at [facility], [place] [address] [place], [place] [address]   CBG monitoring, ED     Status: Abnormal   Collection Time: 03/25/21 12:52 PM  Result Value Ref Range   Glucose-Capillary 216 (H) 70 - 99 mg/dL    Comment: Glucose reference range applies only to samples taken after fasting for at least 8 hours.  Glucose, capillary     Status: Abnormal   Collection Time: 03/25/21  3:26 PM  Result Value Ref Range   Glucose-Capillary 190 (H) 70 - 99 mg/dL    Comment: Glucose reference range applies only to samples taken after fasting for at least 8 hours.    CT KNEE RIGHT WO CONTRAST  Result Date: 03/24/2021 CLINICAL DATA:  Right knee periprosthetic femur fracture. EXAM: CT OF THE RIGHT KNEE WITHOUT CONTRAST TECHNIQUE: Multidetector CT imaging of the right knee was performed according to the standard protocol.  Multiplanar CT image reconstructions were also generated. COMPARISON:  Right knee x-rays from same day. FINDINGS: Bones/Joint/Cartilage Right total knee arthroplasty. Acute comminuted periprosthetic fracture of the distal femoral metaphysis again noted with up to 2.5 cm lateral displacement, 1.9 cm posterior displacement, and 1.9 cm overriding.  There is a nondisplaced longitudinal component extending superiorly into the diaphysis along the posterior cortex (series 604, image 24). No dislocation. No joint effusion. Ligaments Ligaments are suboptimally evaluated by CT. Muscles and Tendons Grossly intact. Moderate atrophy of the visualized lower leg muscles below the knee. Soft tissue Diffuse soft tissue swelling. No fluid collection or hematoma. No soft tissue mass. IMPRESSION: 1. Acute comminuted and displaced periprosthetic fracture of the distal femoral metaphysis as described above. Electronically Signed   By: Titus Dubin M.D.   On: 03/24/2021 16:47   DG Knee Complete 4 Views Right  Result Date: 03/24/2021 CLINICAL DATA:  Right knee pain after fall. EXAM: RIGHT KNEE - COMPLETE 4+ VIEW COMPARISON:  None. FINDINGS: Right total knee arthroplasty. No periprosthetic lucency. Acute periprosthetic fracture of the distal femoral metaphysis with 3.9 cm lateral displacement, 1.8 cm posterior displacement, and 1.8 cm of overriding. No dislocation. Diffuse soft tissue swelling. IMPRESSION: 1. Acute displaced periprosthetic fracture of the distal femoral metaphysis. Electronically Signed   By: Titus Dubin M.D.   On: 03/24/2021 15:34   VAS [place] LOWER EXTREMITY VENOUS (DVT)  Result Date: 03/25/2021  Lower Venous DVT Study Patient Name:  Angela Castro  Date of Exam:   03/25/2021 Medical Rec #: [id number]       Accession #:    [id number] Date of Birth: 1940/11/19       Patient Gender: F Patient Age:   31 years Exam Location:  [facility] Procedure:      VAS [place] LOWER EXTREMITY VENOUS (DVT) Referring Phys: Annamaria Boots XU --------------------------------------------------------------------------------  Indications: Edema.  Risk Factors: Trauma Fall with RLE femur fracture 03/24/2021. Limitations: Poor ultrasound/tissue interface and body habitus. Comparison Study: No previous exams Performing Technologist: Jody Hill RVT, RDMS  Examination Guidelines: A  complete evaluation includes B-mode imaging, spectral Doppler, color Doppler, and power Doppler as needed of all accessible portions of each vessel. Bilateral testing is considered an integral part of a complete examination. Limited examinations for reoccurring indications may be performed as noted. The reflux portion of the exam is performed with the patient in reverse Trendelenburg.  +---------+---------------+---------+-----------+----------+-------------------+ RIGHT    CompressibilityPhasicitySpontaneityPropertiesThrombus Aging      +---------+---------------+---------+-----------+----------+-------------------+ CFV      Full           Yes      Yes                                      +---------+---------------+---------+-----------+----------+-------------------+ SFJ      Full                                                             +---------+---------------+---------+-----------+----------+-------------------+ FV Prox  Full           Yes      Yes                                      +---------+---------------+---------+-----------+----------+-------------------+  FV Mid   Full           Yes      Yes                                      +---------+---------------+---------+-----------+----------+-------------------+ FV DistalFull           Yes      Yes                                      +---------+---------------+---------+-----------+----------+-------------------+ PFV      Full                                                             +---------+---------------+---------+-----------+----------+-------------------+ POP      Full           Yes      Yes                                      +---------+---------------+---------+-----------+----------+-------------------+ PTV      Full                                         Not well visualized +---------+---------------+---------+-----------+----------+-------------------+ PERO     Full                                                              +---------+---------------+---------+-----------+----------+-------------------+   +---------+---------------+---------+-----------+----------+--------------+ LEFT     CompressibilityPhasicitySpontaneityPropertiesThrombus Aging +---------+---------------+---------+-----------+----------+--------------+ CFV      Full           Yes      Yes                                 +---------+---------------+---------+-----------+----------+--------------+ SFJ      Full                                                        +---------+---------------+---------+-----------+----------+--------------+ FV Prox  Full           Yes      Yes                                 +---------+---------------+---------+-----------+----------+--------------+ FV Mid   Full           Yes      Yes                                 +---------+---------------+---------+-----------+----------+--------------+  FV DistalFull           Yes      Yes                                 +---------+---------------+---------+-----------+----------+--------------+ PFV      Full                                                        +---------+---------------+---------+-----------+----------+--------------+ POP      Full           Yes      Yes                                 +---------+---------------+---------+-----------+----------+--------------+ PTV      Full                                                        +---------+---------------+---------+-----------+----------+--------------+ PERO     Full                                                        +---------+---------------+---------+-----------+----------+--------------+     Summary: BILATERAL: - No evidence of deep vein thrombosis seen in the lower extremities, bilaterally. - No evidence of superficial venous thrombosis in the lower extremities, bilaterally. -No evidence of  popliteal cyst, bilaterally.   *See table(s) above for measurements and observations. Electronically signed by Monica Martinez MD on 03/25/2021 at 12:01:50 PM.    Final     Review of Systems  HENT:  Negative for ear discharge, ear pain, hearing loss and tinnitus.   Eyes:  Negative for photophobia and pain.  Respiratory:  Negative for cough and shortness of breath.   Cardiovascular:  Negative for chest pain.  Gastrointestinal:  Negative for abdominal pain, nausea and vomiting.  Genitourinary:  Negative for dysuria, flank pain, frequency and urgency.  Musculoskeletal:  Positive for arthralgias (Right knee). Negative for back pain, myalgias and neck pain.  Neurological:  Negative for dizziness and headaches.  Hematological:  Does not bruise/bleed easily.  Psychiatric/Behavioral:  The patient is not nervous/anxious.   Blood pressure 119/68, pulse 85, temperature 97.9 F (36.6 C), temperature source Oral, resp. rate 16, SpO2 94 %. Physical Exam Constitutional:      General: She is not in acute distress.    Appearance: She is well-developed. She is not diaphoretic.  HENT:     Head: Normocephalic and atraumatic.  Eyes:     General: No scleral icterus.       Right eye: No discharge.        Left eye: No discharge.     Conjunctiva/sclera: Conjunctivae normal.  Cardiovascular:     Rate and Rhythm: Normal rate and regular rhythm.  Pulmonary:     Effort: Pulmonary effort is normal. No respiratory distress.  Musculoskeletal:     Cervical back: Normal range of  motion.     Comments: RLE No traumatic wounds, ecchymosis, or rash  Mod TTP knee  No ankle effusion  Sens DPN, SPN, TN paresthetic  Motor EHL, ext, flex, evers 5/5  DP 1+, PT 0, No significant edema  Skin:    General: Skin is warm and dry.  Neurological:     Mental Status: She is alert.  Psychiatric:        Mood and Affect: Mood normal.        Behavior: Behavior normal.    Assessment/Plan: Right distal femur fx -- Plan ORIF  today with Dr. Doreatha Martin. Please keep NPO. Multiple medical problems including HTN, HLD, IDDM 2, and CKD 3 -- per primary service    Lisette Abu, PA-C Orthopedic Surgery [phone] 03/25/2021, 3:44 PM

## 2021-03-26 ENCOUNTER — Encounter (HOSPITAL_COMMUNITY): Payer: Self-pay | Admitting: Student

## 2021-03-26 DIAGNOSIS — Z419 Encounter for procedure for purposes other than remedying health state, unspecified: Secondary | ICD-10-CM

## 2021-03-26 LAB — TYPE AND SCREEN
ABO/RH(D): O POS
Antibody Screen: NEGATIVE
Unit division: 0
Unit division: 0

## 2021-03-26 LAB — CBC
HCT: 32.6 % — ABNORMAL LOW (ref 36.0–46.0)
Hemoglobin: 10.6 g/dL — ABNORMAL LOW (ref 12.0–15.0)
MCH: 31.2 pg (ref 26.0–34.0)
MCHC: 32.5 g/dL (ref 30.0–36.0)
MCV: 95.9 fL (ref 80.0–100.0)
Platelets: 163 10*3/uL (ref 150–400)
RBC: 3.4 MIL/uL — ABNORMAL LOW (ref 3.87–5.11)
RDW: 16.6 % — ABNORMAL HIGH (ref 11.5–15.5)
WBC: 7.3 10*3/uL (ref 4.0–10.5)
nRBC: 0 % (ref 0.0–0.2)

## 2021-03-26 LAB — CREATININE, SERUM
Creatinine, Ser: 2.08 mg/dL — ABNORMAL HIGH (ref 0.44–1.00)
GFR, Estimated: 24 mL/min — ABNORMAL LOW (ref >60)

## 2021-03-26 LAB — BASIC METABOLIC PANEL
Anion gap: 10 (ref 5–15)
Anion gap: 11 (ref 5–15)
BUN: 34 mg/dL — ABNORMAL HIGH (ref 8–23)
BUN: 35 mg/dL — ABNORMAL HIGH (ref 8–23)
CO2: 21 mmol/L — ABNORMAL LOW (ref 22–32)
CO2: 22 mmol/L (ref 22–32)
Calcium: 8.2 mg/dL — ABNORMAL LOW (ref 8.9–10.3)
Calcium: 8.7 mg/dL — ABNORMAL LOW (ref 8.9–10.3)
Chloride: 105 mmol/L (ref 98–111)
Chloride: 105 mmol/L (ref 98–111)
Creatinine, Ser: 2 mg/dL — ABNORMAL HIGH (ref 0.44–1.00)
Creatinine, Ser: 2.1 mg/dL — ABNORMAL HIGH (ref 0.44–1.00)
GFR, Estimated: 25 mL/min — ABNORMAL LOW (ref >60)
GFR, Estimated: 24 mL/min — ABNORMAL LOW (ref >60)
Glucose, Bld: 389 mg/dL — ABNORMAL HIGH (ref 70–99)
Glucose, Bld: 377 mg/dL — ABNORMAL HIGH (ref 70–99)
Potassium: 6.3 mmol/L (ref 3.5–5.1)
Potassium: 6.2 mmol/L — ABNORMAL HIGH (ref 3.5–5.1)
Sodium: 136 mmol/L (ref 135–145)
Sodium: 138 mmol/L (ref 135–145)

## 2021-03-26 LAB — BPAM RBC
Blood Product Expiration Date: 202212072359
Blood Product Expiration Date: 202212082359
ISSUE DATE / TIME: 202211141842
ISSUE DATE / TIME: 202211141842
Unit Type and Rh: 5100
Unit Type and Rh: 5100

## 2021-03-26 LAB — GLUCOSE, CAPILLARY
Glucose-Capillary: 367 mg/dL — ABNORMAL HIGH (ref 70–99)
Glucose-Capillary: 372 mg/dL — ABNORMAL HIGH (ref 70–99)
Glucose-Capillary: 384 mg/dL — ABNORMAL HIGH (ref 70–99)
Glucose-Capillary: 326 mg/dL — ABNORMAL HIGH (ref 70–99)

## 2021-03-26 LAB — VITAMIN D 25 HYDROXY (VIT D DEFICIENCY, FRACTURES): Vit D, 25-Hydroxy: 33.99 ng/mL (ref 30–100)

## 2021-03-26 LAB — PREPARE RBC (CROSSMATCH)

## 2021-03-26 MED ORDER — ENOXAPARIN SODIUM 30 MG/0.3ML IJ SOSY
30.0000 mg | PREFILLED_SYRINGE | INTRAMUSCULAR | Status: DC
  Administered 2021-03-27 – 2021-03-28 (×2): 30 mg via SUBCUTANEOUS
  Filled 2021-03-26 (×2): qty 0.3

## 2021-03-26 MED ORDER — INSULIN GLARGINE-YFGN 100 UNIT/ML ~~LOC~~ SOLN
45.0000 [IU] | Freq: Every day | SUBCUTANEOUS | Status: DC
  Administered 2021-03-26: 45 [IU] via SUBCUTANEOUS
  Filled 2021-03-26 (×3): qty 0.45

## 2021-03-26 MED ORDER — INSULIN GLARGINE-YFGN 100 UNIT/ML ~~LOC~~ SOLN
45.0000 [IU] | Freq: Every day | SUBCUTANEOUS | Status: DC
  Filled 2021-03-26: qty 0.45

## 2021-03-26 MED ORDER — SODIUM ZIRCONIUM CYCLOSILICATE 10 G PO PACK
10.0000 g | PACK | Freq: Once | ORAL | Status: AC
  Administered 2021-03-26: 10 g via ORAL
  Filled 2021-03-26: qty 1

## 2021-03-26 MED ORDER — INSULIN ASPART 100 UNIT/ML IJ SOLN
3.0000 [IU] | Freq: Three times a day (TID) | INTRAMUSCULAR | Status: DC
  Administered 2021-03-26 – 2021-03-28 (×5): 3 [IU] via SUBCUTANEOUS

## 2021-03-26 MED ORDER — SODIUM ZIRCONIUM CYCLOSILICATE 5 G PO PACK
5.0000 g | PACK | Freq: Two times a day (BID) | ORAL | Status: AC
  Administered 2021-03-26 (×2): 5 g via ORAL
  Filled 2021-03-26 (×2): qty 1

## 2021-03-26 NOTE — Progress Notes (Signed)
Orthopaedic Trauma Progress Note  SUBJECTIVE: Doing ok this AM. Leg is sore but pain manageable. Noted to have elevated K this AM. No chest pain. No SOB. No nausea/vomiting. No other complaints. Patient hopeful to return home, would like to avoid rehab facility if able. Husband at bedside  OBJECTIVE:  Vitals:   03/26/21 0045 03/26/21 0400  BP:  (!) 157/80  Pulse:  100  Resp:  18  Temp:  97.9 F (36.6 C)  SpO2: 94% 95%    General: Laying in bed, NAD Respiratory: No increased work of breathing.  RLE: Dressing CDI. Tender over distal thigh as expected does not tolerate any knee motion currently. Ankle DF/PF intact. Decreased sensation about the foot. Neuropathy at baseline, equal to contralateral side. Able to wiggle toes. +DP pulse. Compartment soft and compressible.  IMAGING: Stable post op imaging.   LABS:  Results for orders placed or performed during the hospital encounter of 03/24/21 (from the past 24 hour(s))  Type and screen [place]     Status: None   Collection Time: 03/25/21 11:10 AM  Result Value Ref Range   ABO/RH(D) O POS    Antibody Screen NEG    Sample Expiration      03/28/2021,2359 Performed at [facility], [place] [address] [place], [place] [address]   CBG monitoring, ED     Status: Abnormal   Collection Time: 03/25/21 12:52 PM  Result Value Ref Range   Glucose-Capillary 216 (H) 70 - 99 mg/dL  Glucose, capillary     Status: Abnormal   Collection Time: 03/25/21  3:26 PM  Result Value Ref Range   Glucose-Capillary 190 (H) 70 - 99 mg/dL  Type and screen [place]     Status: None (Preliminary result)   Collection Time: 03/25/21  3:45 PM  Result Value Ref Range   ABO/RH(D) O POS    Antibody Screen NEG    Sample Expiration 03/28/2021,2359    Unit Number [id number]    Blood Component Type RED CELLS,LR    Unit division 00    Status of Unit ISSUED    Transfusion Status OK TO TRANSFUSE     Crossmatch Result      Compatible Performed at [facility], [place] [address], [place], [place] [address]    Unit Number [id number]    Blood Component Type RED CELLS,LR    Unit division 00    Status of Unit ISSUED    Transfusion Status OK TO TRANSFUSE    Crossmatch Result Compatible   Glucose, capillary     Status: Abnormal   Collection Time: 03/25/21  7:32 PM  Result Value Ref Range   Glucose-Capillary 218 (H) 70 - 99 mg/dL  Prepare RBC (crossmatch)     Status: None   Collection Time: 03/25/21  8:34 PM  Result Value Ref Range   Order Confirmation      ORDER PROCESSED BY BLOOD BANK Performed at [facility], [place] [address] [place], [place] [address]   Glucose, capillary     Status: Abnormal   Collection Time: 03/25/21 10:43 PM  Result Value Ref Range   Glucose-Capillary 319 (H) 70 - 99 mg/dL  CBC     Status: Abnormal   Collection Time: 03/25/21 11:18 PM  Result Value Ref Range   WBC 8.6 4.0 - 10.5 K/uL   RBC 3.81 (L) 3.87 - 5.11 MIL/uL   Hemoglobin 11.8 (L) 12.0 - 15.0 g/dL   HCT 36.0  36.0 - 46.0 %   MCV 94.5 80.0 - 100.0 fL   MCH 31.0 26.0 - 34.0 pg   MCHC 32.8 30.0 - 36.0 g/dL   RDW 16.3 (H) 11.5 - 15.5 %   Platelets 160 150 - 400 K/uL   nRBC 0.2 0.0 - 0.2 %  Creatinine, serum     Status: Abnormal   Collection Time: 03/25/21 11:18 PM  Result Value Ref Range   Creatinine, Ser 2.08 (H) 0.44 - 1.00 mg/dL   GFR, Estimated 24 (L) >60 mL/min  Basic metabolic panel     Status: Abnormal   Collection Time: 03/26/21  2:15 AM  Result Value Ref Range   Sodium 136 135 - 145 mmol/L   Potassium 6.3 (HH) 3.5 - 5.1 mmol/L   Chloride 105 98 - 111 mmol/L   CO2 21 (L) 22 - 32 mmol/L   Glucose, Bld 389 (H) 70 - 99 mg/dL   BUN 34 (H) 8 - 23 mg/dL   Creatinine, Ser 2.00 (H) 0.44 - 1.00 mg/dL   Calcium 8.2 (L) 8.9 - 10.3 mg/dL   GFR, Estimated 25 (L) >60 mL/min   Anion gap 10 5 - 15  CBC     Status: Abnormal   Collection Time: 03/26/21  2:15 AM  Result Value Ref  Range   WBC 7.3 4.0 - 10.5 K/uL   RBC 3.40 (L) 3.87 - 5.11 MIL/uL   Hemoglobin 10.6 (L) 12.0 - 15.0 g/dL   HCT 32.6 (L) 36.0 - 46.0 %   MCV 95.9 80.0 - 100.0 fL   MCH 31.2 26.0 - 34.0 pg   MCHC 32.5 30.0 - 36.0 g/dL   RDW 16.6 (H) 11.5 - 15.5 %   Platelets 163 150 - 400 K/uL   nRBC 0.0 0.0 - 0.2 %  Basic metabolic panel     Status: Abnormal   Collection Time: 03/26/21  4:59 AM  Result Value Ref Range   Sodium 138 135 - 145 mmol/L   Potassium 6.2 (H) 3.5 - 5.1 mmol/L   Chloride 105 98 - 111 mmol/L   CO2 22 22 - 32 mmol/L   Glucose, Bld 377 (H) 70 - 99 mg/dL   BUN 35 (H) 8 - 23 mg/dL   Creatinine, Ser 2.10 (H) 0.44 - 1.00 mg/dL   Calcium 8.7 (L) 8.9 - 10.3 mg/dL   GFR, Estimated 24 (L) >60 mL/min   Anion gap 11 5 - 15  Glucose, capillary     Status: Abnormal   Collection Time: 03/26/21  8:52 AM  Result Value Ref Range   Glucose-Capillary 367 (H) 70 - 99 mg/dL    ASSESSMENT: Angela Castro is a 80 y.o. female, 1 Day Post-Op s/p ORIF RIGHT DISTAL FEMUR FRACTURE  CV/Blood loss: Acute blood loss anemia, Hgb 10.3 this morning. Hemodynamically stable  PLAN: Weightbearing:  50% PWB RLE   ROM: No ROM restrictions Incisional and dressing care:  Plan to remove dressing tomorrow Showering: Ok to begin showering 03/28/21 with assistance Orthopedic device(s): None  Pain management:  1. Tylenol 325-650 mg q 6 hours PRN 2. Tramadol 50 mg BID 3. Percocet 5-325 mg q 6 hours PRN 4. Neurontin 400 mg TID 5. Dilaudid 0.5 mg q 4 hours PRN VTE prophylaxis: Lovenox, SCDs ID:  Ancef 2gm post op Foley/Lines:  No foley, KVO IVFs Impediments to Fracture Healing: Vit D level 33, no supplementation needed Dispo: PT/OT eval today, dispo pending. Plan to change dressing tomorrow Follow - up plan: 2 weeks for  repeat x-rays  Contact information:  Katha Hamming MD, Patrecia Pace PA-C. After hours and holidays please check [email or web address] for group call information for [facility]   Angela Lober A. Ricci Barker,  PA-C [phone] (office) [email or web address]

## 2021-03-26 NOTE — Plan of Care (Signed)

## 2021-03-26 NOTE — Progress Notes (Signed)
[facility]  PROGRESS NOTE  Angela Castro LFY:[id number] DOB: Sep 20, 1940 DOA: 03/24/2021 PCP: London Pepper, MD   Brief HPI:   80 year old female with medical history of hypertension, hyperlipidemia, diabetes mellitus type 2, CKD stage III initially presented to ED after falling while getting out of car.  She has history of right knee replacement 8 years ago in the ED x-rays and CT of the right knee showed displaced periprosthetic fracture involving the distal femoral metaphysis. Knee was placed in immobilizer and patient was transferred to [facility].    Subjective   Patient seen and examined, underwent ORIF right distal femur fracture.  Denies any pain this morning.  Potassium was elevated this morning at 6.2.   Assessment/Plan:     Hyperkalemia -Potassium this morning was 6.2 -We will start Lokelma 5 g p.o. twice daily for 2 doses -Follow BMP in am  Diabetes mellitus type 2, uncontrolled -Blood glucose significantly elevated up to 300s -Patient takes Lantus 60 units daily at bedtime at home -She was started on Semglee 30 units subcu daily in the hospital -We will increase the dose of slightly to 45 units subcu daily -Continue moderate intensity sliding scale insulin with NovoLog -Add NovoLog 3 units 3 times daily with meal coverage  Acute kidney injury on CKD stage IV -Patient baseline creatinine is around 1.6-1.9 -Today creatinine up to 2.10 -Continue normal saline at 75 mill per hour -Follow BMP in am  Right distal femur fracture s/p ORIF -Orthopedics following   Bilateral extremity edema -Lower extremity venous duplex is negative for DVT -Norvasc on hold  Hypertension -Blood pressure is stable -Continue bisoprolol -Norvasc on hold  Hyperlipidemia -Continue simvastatin  Medications     sodium chloride   Intravenous Once   allopurinol  100 mg Oral BID   vitamin C  500 mg Oral Daily   bisoprolol  10 mg Oral Daily   calcium-vitamin D  1  tablet Oral Q breakfast   docusate sodium  100 mg Oral BID   enoxaparin (LOVENOX) injection  40 mg Subcutaneous Q24H   ferrous sulfate  325 mg Oral BID WC   gabapentin  400 mg Oral TID   insulin aspart  0-15 Units Subcutaneous TID WC   insulin aspart  3 Units Subcutaneous TID WC   insulin glargine-yfgn  45 Units Subcutaneous QHS   loratadine  10 mg Oral Daily   simvastatin  5 mg Oral QHS   sodium zirconium cyclosilicate  5 g Oral BID   traMADol  50 mg Oral BID     Data Reviewed:   CBG:  Recent Labs  Lab 03/25/21 1526 03/25/21 1932 03/25/21 2243 03/26/21 0852 03/26/21 1126  GLUCAP 190* 218* 319* 367* 372*    SpO2: 94 % O2 Flow Rate (L/min): 1 L/min    Vitals:   03/26/21 0045 03/26/21 0400 03/26/21 0856 03/26/21 1304  BP:  (!) 157/80 133/79 (!) 149/72  Pulse:  100 97 (!) 102  Resp:  18 16 18   Temp:  97.9 F (36.6 C) 97.8 F (36.6 C) 97.7 F (36.5 C)  TempSrc:  Oral Oral Oral  SpO2: 94% 95% 94% 94%  Weight:      Height:         Intake/Output Summary (Last 24 hours) at 03/26/2021 1355 Last data filed at 03/26/2021 1100 Gross per 24 hour  Intake 3007.69 ml  Output 850 ml  Net 2157.69 ml    11/13 1901 - 11/15 0700 In: 3007.7 [P.O.:480; I.V.:1578] Out: 250 [Urine:200]  Filed Weights   03/25/21 1536 03/25/21 2045  Weight: 77.1 kg 85.1 kg    Data Reviewed: Basic Metabolic Panel: Recent Labs  Lab 03/24/21 1545 03/25/21 0420 03/25/21 2318 03/26/21 0215 03/26/21 0459  NA 142 141  --  136 138  K 4.5 5.1  --  6.3* 6.2*  CL 108 108  --  105 105  CO2 28 25  --  21* 22  GLUCOSE 226* 241*  --  389* 377*  BUN 24* 29*  --  34* 35*  CREATININE 1.53* 1.61* 2.08* 2.00* 2.10*  CALCIUM 8.8* 8.4*  --  8.2* 8.7*   Liver Function Tests: Recent Labs  Lab 03/24/21 1545  AST 22  ALT 17  ALKPHOS 83  BILITOT 0.5  PROT 6.7  ALBUMIN 3.7   No results for input(s): LIPASE, AMYLASE in the last 168 hours. No results for input(s): AMMONIA in the last 168  hours. CBC: Recent Labs  Lab 03/24/21 1545 03/25/21 0420 03/25/21 2318 03/26/21 0215  WBC 11.4* 7.1 8.6 7.3  NEUTROABS 9.0*  --   --   --   HGB 10.7* 7.6* 11.8* 10.6*  HCT 32.4* 23.4* 36.0 32.6*  MCV 98.2 100.9* 94.5 95.9  PLT 233 161 160 163   Cardiac Enzymes: No results for input(s): CKTOTAL, CKMB, CKMBINDEX, TROPONINI in the last 168 hours. BNP (last 3 results) No results for input(s): BNP in the last 8760 hours.  ProBNP (last 3 results) No results for input(s): PROBNP in the last 8760 hours.  CBG: Recent Labs  Lab 03/25/21 1526 03/25/21 1932 03/25/21 2243 03/26/21 0852 03/26/21 1126  GLUCAP 190* 218* 319* 367* 372*       Radiology Reports  CT KNEE RIGHT WO CONTRAST  Result Date: 03/24/2021 CLINICAL DATA:  Right knee periprosthetic femur fracture. EXAM: CT OF THE RIGHT KNEE WITHOUT CONTRAST TECHNIQUE: Multidetector CT imaging of the right knee was performed according to the standard protocol. Multiplanar CT image reconstructions were also generated. COMPARISON:  Right knee x-rays from same day. FINDINGS: Bones/Joint/Cartilage Right total knee arthroplasty. Acute comminuted periprosthetic fracture of the distal femoral metaphysis again noted with up to 2.5 cm lateral displacement, 1.9 cm posterior displacement, and 1.9 cm overriding. There is a nondisplaced longitudinal component extending superiorly into the diaphysis along the posterior cortex (series 604, image 24). No dislocation. No joint effusion. Ligaments Ligaments are suboptimally evaluated by CT. Muscles and Tendons Grossly intact. Moderate atrophy of the visualized lower leg muscles below the knee. Soft tissue Diffuse soft tissue swelling. No fluid collection or hematoma. No soft tissue mass. IMPRESSION: 1. Acute comminuted and displaced periprosthetic fracture of the distal femoral metaphysis as described above. Electronically Signed   By: Titus Dubin M.D.   On: 03/24/2021 16:47   DG Knee Complete 4 Views  Right  Result Date: 03/24/2021 CLINICAL DATA:  Right knee pain after fall. EXAM: RIGHT KNEE - COMPLETE 4+ VIEW COMPARISON:  None. FINDINGS: Right total knee arthroplasty. No periprosthetic lucency. Acute periprosthetic fracture of the distal femoral metaphysis with 3.9 cm lateral displacement, 1.8 cm posterior displacement, and 1.8 cm of overriding. No dislocation. Diffuse soft tissue swelling. IMPRESSION: 1. Acute displaced periprosthetic fracture of the distal femoral metaphysis. Electronically Signed   By: Titus Dubin M.D.   On: 03/24/2021 15:34   DG Knee Right Port  Result Date: 03/25/2021 CLINICAL DATA:  Postop films.  ORIF of right femur fracture. EXAM: PORTABLE RIGHT KNEE - 1-2 VIEW COMPARISON:  X-ray intraoperative 03/25/2021, CT knee 03/24/2021 FINDINGS:  Redemonstration of a total right knee arthroplasty. Interval placement of a mid to distal femoral metadiaphysis plate and screw fixation. Improved anatomical alignment of the known comminuted femoral fracture. No evidence of new fracture or dislocation. Gas and fluid noted within the joint space consistent with postsurgical changes. Subcutaneus soft tissue edema and emphysema consistent postsurgical changes. IMPRESSION: Postop femoral fixation with plate and screw fixation in a patient with a total right knee arthroplasty. Electronically Signed   By: Iven Finn M.D.   On: 03/25/2021 20:24   DG C-Arm 1-60 Min-No Report  Result Date: 03/25/2021 Fluoroscopy was utilized by the requesting physician.  No radiographic interpretation.   DG FEMUR, MIN 2 VIEWS RIGHT  Result Date: 03/25/2021 CLINICAL DATA:  Distal right femoral fracture EXAM: RIGHT FEMUR 2 VIEWS COMPARISON:  03/24/2021 FLUOROSCOPY TIME:  Radiation Exposure Index (as provided by the fluoroscopic device): 4 mGy If the device does not provide the exposure index: Fluoroscopy Time:  45 seconds Number of Acquired Images:  8 FINDINGS: Initial images again demonstrate the distal  right femoral periprosthetic fracture. Fracture fragments were subsequently reduced and lateral fixation sideplate was placed with proximal and distal fixation screws. Fracture fragments are in near anatomic alignment. IMPRESSION: ORIF of distal right femoral fracture. Electronically Signed   By: Inez Catalina M.D.   On: 03/25/2021 19:57   VAS [place] LOWER EXTREMITY VENOUS (DVT)  Result Date: 03/25/2021  Lower Venous DVT Study Patient Name:  Angela Castro  Date of Exam:   03/25/2021 Medical Rec #: [id number]       Accession #:    [id number] Date of Birth: [birth year]       Patient Gender: F Patient Age:   47 years Exam Location:  [facility] Procedure:      VAS [place] LOWER EXTREMITY VENOUS (DVT) Referring Phys: Annamaria Boots XU --------------------------------------------------------------------------------  Indications: Edema.  Risk Factors: Trauma Fall with RLE femur fracture 03/24/2021. Limitations: Poor ultrasound/tissue interface and body habitus. Comparison Study: No previous exams Performing Technologist: Jody Hill RVT, RDMS  Examination Guidelines: A complete evaluation includes B-mode imaging, spectral Doppler, color Doppler, and power Doppler as needed of all accessible portions of each vessel. Bilateral testing is considered an integral part of a complete examination. Limited examinations for reoccurring indications may be performed as noted. The reflux portion of the exam is performed with the patient in reverse Trendelenburg.  +---------+---------------+---------+-----------+----------+-------------------+ RIGHT    CompressibilityPhasicitySpontaneityPropertiesThrombus Aging      +---------+---------------+---------+-----------+----------+-------------------+ CFV      Full           Yes      Yes                                      +---------+---------------+---------+-----------+----------+-------------------+ SFJ      Full                                                              +---------+---------------+---------+-----------+----------+-------------------+ FV Prox  Full           Yes      Yes                                      +---------+---------------+---------+-----------+----------+-------------------+  FV Mid   Full           Yes      Yes                                      +---------+---------------+---------+-----------+----------+-------------------+ FV DistalFull           Yes      Yes                                      +---------+---------------+---------+-----------+----------+-------------------+ PFV      Full                                                             +---------+---------------+---------+-----------+----------+-------------------+ POP      Full           Yes      Yes                                      +---------+---------------+---------+-----------+----------+-------------------+ PTV      Full                                         Not well visualized +---------+---------------+---------+-----------+----------+-------------------+ PERO     Full                                                             +---------+---------------+---------+-----------+----------+-------------------+   +---------+---------------+---------+-----------+----------+--------------+ LEFT     CompressibilityPhasicitySpontaneityPropertiesThrombus Aging +---------+---------------+---------+-----------+----------+--------------+ CFV      Full           Yes      Yes                                 +---------+---------------+---------+-----------+----------+--------------+ SFJ      Full                                                        +---------+---------------+---------+-----------+----------+--------------+ FV Prox  Full           Yes      Yes                                 +---------+---------------+---------+-----------+----------+--------------+ FV Mid   Full           Yes      Yes                                  +---------+---------------+---------+-----------+----------+--------------+  FV DistalFull           Yes      Yes                                 +---------+---------------+---------+-----------+----------+--------------+ PFV      Full                                                        +---------+---------------+---------+-----------+----------+--------------+ POP      Full           Yes      Yes                                 +---------+---------------+---------+-----------+----------+--------------+ PTV      Full                                                        +---------+---------------+---------+-----------+----------+--------------+ PERO     Full                                                        +---------+---------------+---------+-----------+----------+--------------+     Summary: BILATERAL: - No evidence of deep vein thrombosis seen in the lower extremities, bilaterally. - No evidence of superficial venous thrombosis in the lower extremities, bilaterally. -No evidence of popliteal cyst, bilaterally.   *See table(s) above for measurements and observations. Electronically signed by Monica Martinez MD on 03/25/2021 at 12:01:50 PM.    Final        Antibiotics: Anti-infectives (From admission, onward)    Start     Dose/Rate Route Frequency Ordered Stop   03/26/21 0000  vancomycin (VANCOREADY) IVPB 1000 mg/200 mL        1,000 mg 200 mL/hr over 60 Minutes Intravenous Every 12 hours 03/25/21 2039 03/26/21 0400   03/25/21 1856  vancomycin (VANCOCIN) powder  Status:  Discontinued          As needed 03/25/21 1856 03/25/21 1926   03/25/21 1600  vancomycin (VANCOCIN) IVPB 1000 mg/200 mL premix        1,000 mg 200 mL/hr over 60 Minutes Intravenous On call to O.R. 03/25/21 1548 03/25/21 1656         DVT prophylaxis: Lovenox  Code Status: Full code  Family Communication: No family at  bedside   Consultants: Orthopedics  Procedures: ORIF distal femur fracture    Objective    Physical Examination:   General-appears in no acute distress Heart-S1-S2, regular, no murmur auscultated Lungs-clear to auscultation bilaterally, no wheezing or crackles auscultated Abdomen-soft, nontender, no organomegaly Extremities-trace edema in the lower extremities Neuro-alert, oriented x3, no focal deficit noted  Status is: Inpatient  Dispo: The patient is from: Home              Anticipated d/c is to: Skilled nursing facility  Anticipated d/c date is:03/29/21              Patient currently not stable for discharge  Barrier to discharge-awaiting bed at skilled nursing facility  COVID-19 Labs  No results for input(s): DDIMER, FERRITIN, LDH, CRP in the last 72 hours.  Lab Results  Component Value Date   SARSCOV2NAA NEGATIVE 03/24/2021   [place] Not Detected 03/17/2019            Recent Results (from the past 240 hour(s))  Resp Panel by RT-PCR (Flu A&B, Covid) Nasopharyngeal Swab     Status: None   Collection Time: 03/24/21  3:45 PM   Specimen: Nasopharyngeal Swab; Nasopharyngeal(NP) swabs in vial transport medium  Result Value Ref Range Status   SARS Coronavirus 2 by RT PCR NEGATIVE NEGATIVE Final    Comment: (NOTE) SARS-CoV-2 target nucleic acids are NOT DETECTED.  The SARS-CoV-2 RNA is generally detectable in upper respiratory specimens during the acute phase of infection. The lowest concentration of SARS-CoV-2 viral copies this assay can detect is 138 copies/mL. A negative result does not preclude SARS-Cov-2 infection and should not be used as the sole basis for treatment or other patient management decisions. A negative result may occur with  improper specimen collection/handling, submission of specimen other than nasopharyngeal swab, presence of viral mutation(s) within the areas targeted by this assay, and inadequate number of  viral copies(<138 copies/mL). A negative result must be combined with clinical observations, patient history, and epidemiological information. The expected result is Negative.  Fact Sheet for Patients:  EntrepreneurPulse.com.au  Fact Sheet for Healthcare Providers:  IncredibleEmployment.be  This test is no t yet approved or cleared by the [place] FDA and  has been authorized for detection and/or diagnosis of SARS-CoV-2 by FDA under an Emergency Use Authorization (EUA). This EUA will remain  in effect (meaning this test can be used) for the duration of the COVID-19 declaration under Section 564(b)(1) of the Act, 21 U.S.C.section 360bbb-3(b)(1), unless the authorization is terminated  or revoked sooner.       Influenza A by PCR NEGATIVE NEGATIVE Final   Influenza B by PCR NEGATIVE NEGATIVE Final    Comment: (NOTE) The Xpert Xpress SARS-CoV-2/FLU/RSV plus assay is intended as an aid in the diagnosis of influenza from Nasopharyngeal swab specimens and should not be used as a sole basis for treatment. Nasal washings and aspirates are unacceptable for Xpert Xpress SARS-CoV-2/FLU/RSV testing.  Fact Sheet for Patients: EntrepreneurPulse.com.au  Fact Sheet for Healthcare Providers: IncredibleEmployment.be  This test is not yet approved or cleared by the [place] FDA and has been authorized for detection and/or diagnosis of SARS-CoV-2 by FDA under an Emergency Use Authorization (EUA). This EUA will remain in effect (meaning this test can be used) for the duration of the COVID-19 declaration under Section 564(b)(1) of the Act, 21 U.S.C. section 360bbb-3(b)(1), unless the authorization is terminated or revoked.  Performed at [facility], [place] [address], [place], [place] [address]     Oswald Hillock   [facility] If 7PM-7AM, please contact night-coverage at  [email or web address], Office  [phone]   03/26/2021, 1:55 PM  LOS: 2 days

## 2021-03-26 NOTE — Evaluation (Signed)
Physical Therapy Evaluation Patient Details Name: Angela Castro MRN: [id number] DOB: 1940/10/02 Today's Date: 03/26/2021  History of Present Illness  Patient is a 80 y/o female who presents with right periprosthetic femur fx after falling out of her car s/p ORIF 03/25/21.  PMH includes HTN, DM, first MT amputation.  Clinical Impression  Patient presents with pain, generalized weakness, impaired balance, post surgical deficits RLE and impaired mobility s/p above. Pt lives at home with spouse and reports being Mod I for ADLs and uses rollator for ambulation. Today, pt requires Mod-Max A for all mobility with difficulty stepping to get to chair due to weakness/pain. Instructed pt in PWB status (50%), there ex and importance of OOB mobility. Would benefit from SNF to maximize independence and mobility prior to return home. Pt wants to return home but spouse understands that rehab is likely the better option at this time. Will continue to follow and update recommendations based on progress.       Recommendations for follow up therapy are one component of a multi-disciplinary discharge planning process, led by the attending physician.  Recommendations may be updated based on patient status, additional functional criteria and insurance authorization.  Follow Up Recommendations Skilled nursing-short term rehab (<3 hours/day)    Assistance Recommended at Discharge Frequent or constant Supervision/Assistance  Functional Status Assessment Patient has had a recent decline in their functional status and demonstrates the ability to make significant improvements in function in a reasonable and predictable amount of time.  Equipment Recommendations  BSC/3in1 (TBD pending progress/dispo)    Recommendations for Other Services       Precautions / Restrictions Precautions Precautions: Fall Restrictions Weight Bearing Restrictions: Yes RLE Weight Bearing: Partial weight bearing RLE Partial Weight Bearing  Percentage or Pounds: 50%      Mobility  Bed Mobility Overal bed mobility: Needs Assistance Bed Mobility: Rolling;Sidelying to Sit Rolling: Mod assist Sidelying to sit: Mod assist;HOB elevated       General bed mobility comments: Cues for sequencing, to bridge through LLE, assist with RLE, trunk and scooting bottom to EOB. Heavy use of rail.    Transfers Overall transfer level: Needs assistance Equipment used: Rolling walker (2 wheels) Transfers: Sit to/from Stand;Bed to chair/wheelchair/BSC Sit to Stand: Mod assist   Step pivot transfers: Max assist;+2 physical assistance;+2 safety/equipment       General transfer comment: Assist to power to standing with cues for hand placement/technique, pt prefers pulling up on RW and leans on it with forearms due to UEs giving out when pushing through hands. Max A of 2 to step pivot towards chair with difficulty advancing LLE, assist with weight shifting, leaning on RW with forearms despite cues to use hands, increased time/difficulty.    Ambulation/Gait               General Gait Details: Unable  Stairs            Wheelchair Mobility    Modified Rankin (Stroke Patients Only)       Balance Overall balance assessment: Needs assistance Sitting-balance support: Feet supported;Bilateral upper extremity supported Sitting balance-Leahy Scale: Fair     Standing balance support: During functional activity;Reliant on assistive device for balance Standing balance-Leahy Scale: Poor Standing balance comment: External support needed due to imbalance, weakness and knee buckling on right.                             Pertinent Vitals/Pain Pain Assessment: Faces  Faces Pain Scale: Hurts even more Pain Location: RLE with movement Pain Descriptors / Indicators: Sore;Operative site guarding;Grimacing;Guarding Pain Intervention(s): Monitored during session;Patient requesting pain meds-RN notified;Limited activity within  patient's tolerance;Repositioned    Home Living Family/patient expects to be discharged to:: Skilled nursing facility Living Arrangements: Spouse/significant other Available Help at Discharge: Family;Available 24 hours/day Type of Home: Other(Comment) Home Access: Level entry       Home Layout: One level Home Equipment: Conservation officer, nature (2 wheels);Rollator (4 wheels);Shower seat Additional Comments: multiple walkers    Prior Function Prior Level of Function : Independent/Modified Independent             Mobility Comments: USes rollator for ambulation, does not drive. No falls reported in last 6 months except this one leading to admission ADLs Comments: Does own ADLs, does bird baths. Helps with cooking.     Hand Dominance   Dominant Hand: Right    Extremity/Trunk Assessment   Upper Extremity Assessment Upper Extremity Assessment: Defer to OT evaluation    Lower Extremity Assessment Lower Extremity Assessment: RLE deficits/detail;LLE deficits/detail;Generalized weakness RLE Deficits / Details: Limited Ankle AROM/toe movement, able to perform QS, post surgical deficits. RLE Sensation: history of peripheral neuropathy;decreased light touch (distal to knee halfway from shin down to foot) LLE Sensation: history of peripheral neuropathy;decreased light touch (distal to knee halfway from shin down to foot)       Communication   Communication: No difficulties  Cognition Arousal/Alertness: Awake/alert Behavior During Therapy: WFL for tasks assessed/performed Overall Cognitive Status: Within Functional Limits for tasks assessed                                 General Comments: for basic mobility tasks; eager to return home despite less than optimal performance during session.        General Comments General comments (skin integrity, edema, etc.): Spouse present during session.    Exercises General Exercises - Lower Extremity Ankle Circles/Pumps:  AROM;Both;10 reps;Supine Quad Sets: AROM;Both;Supine (x3)   Assessment/Plan    PT Assessment Patient needs continued PT services  PT Problem List Decreased strength;Decreased range of motion;Decreased mobility;Pain;Impaired sensation;Decreased balance;Decreased activity tolerance;Decreased skin integrity       PT Treatment Interventions Gait training;Therapeutic activities;DME instruction;Therapeutic exercise;Patient/family education;Balance training;Functional mobility training;Wheelchair mobility training    PT Goals (Current goals can be found in the Care Plan section)  Acute Rehab PT Goals Patient Stated Goal: patient wants to go home, spouse okay with rehab PT Goal Formulation: With patient/family Time For Goal Achievement: 04/09/21 Potential to Achieve Goals: Good    Frequency Min 3X/week   Barriers to discharge Decreased caregiver support      Co-evaluation               AM-PAC PT "6 Clicks" Mobility  Outcome Measure Help needed turning from your back to your side while in a flat bed without using bedrails?: A Lot Help needed moving from lying on your back to sitting on the side of a flat bed without using bedrails?: A Lot Help needed moving to and from a bed to a chair (including a wheelchair)?: Total Help needed standing up from a chair using your arms (e.g., wheelchair or bedside chair)?: A Lot Help needed to walk in hospital room?: Total Help needed climbing 3-5 steps with a railing? : Total 6 Click Score: 9    End of Session Equipment Utilized During Treatment: Gait belt Activity Tolerance:  Patient limited by pain;Patient limited by fatigue Patient left: in chair;with call bell/phone within reach;with family/visitor present;with nursing/sitter in room (chair alarm pad under patient) Nurse Communication: Mobility status;Patient requests pain meds PT Visit Diagnosis: Pain;Muscle weakness (generalized) (M62.81);Difficulty in walking, not elsewhere classified  (R26.2);Unsteadiness on feet (R26.81) Pain - Right/Left: Right Pain - part of body: Knee    Time: 8403-9795 PT Time Calculation (min) (ACUTE ONLY): 32 min   Charges:   PT Evaluation $PT Eval Moderate Complexity: 1 Mod PT Treatments $Therapeutic Activity: 8-22 mins        Marisa Severin, PT, DPT Acute Rehabilitation Services Pager [phone] Office [phone]     Marguarite Arbour A Sabra Heck 03/26/2021, 9:29 AM

## 2021-03-26 NOTE — Progress Notes (Signed)
Inpatient Diabetes Program Recommendations  AACE/ADA: New Consensus Statement on Inpatient Glycemic Control   Target Ranges:  Prepandial:   less than 140 mg/dL      Peak postprandial:   less than 180 mg/dL (1-2 hours)      Critically ill patients:  140 - 180 mg/dL   Results for Angela Castro, Angela Castro (MRN [id number]) as of 03/26/2021 10:19  Ref. Range 03/25/2021 08:02 03/25/2021 12:52 03/25/2021 15:26 03/25/2021 19:32 03/25/2021 22:43 03/26/2021 08:52  Glucose-Capillary Latest Ref Range: 70 - 99 mg/dL 226 (H) 216 (H) 190 (H) 218 (H) 319 (H) 367 (H)    Review of Glycemic Control  Diabetes history: DM2 Outpatient Diabetes medications: Lantus 30 QHS, Glipizide 2.5 mg daily, Januvia 50 mg QAM Current orders for Inpatient glycemic control: Semglee 30 units QHS, Novolog 0-15 units TID  Inpatient Diabetes Program Recommendations:    Insulin: Please consider increasing Semglee 35 units QHS, ordering Novolog 0-5 units QHS and Novolog 3 units TID with meals for meal coverage if patient eats at least 50% of meals.  Thanks, Barnie Alderman, RN, MSN, CDE Diabetes Coordinator Inpatient Diabetes Program [phone] (Team Pager from 8am to 5pm)

## 2021-03-26 NOTE — Evaluation (Signed)
Occupational Therapy Evaluation Patient Details Name: Angela Castro MRN: [id number] DOB: 02-06-1941 Today's Date: 03/26/2021   History of Present Illness Patient is a 80 y/o female who presents with right periprosthetic femur fx after falling out of her car s/p ORIF 03/25/21.  PMH includes HTN, DM, first MT amputation.   Clinical Impression   PTA, pt uses Rollator for functional mobility, independent with ADLs, notes she sponge bathes at baseline. Pt min - mod for ADLs, max A for LB ADLs, mod-max A +2 for transfers, and mod A for bed mobility. Pt rates pain 5/10 during session, required cues to help pivot feet during stand pivot transfer. Pt presenting with decreased balance, activity tolerance, ROM, strength, and pain at this time, will continue to follow acutely. Pt prefers to remain at home at d/c, however recommend SNF vs. HH pending progress.   Recommendations for follow up therapy are one component of a multi-disciplinary discharge planning process, led by the attending physician.  Recommendations may be updated based on patient status, additional functional criteria and insurance authorization.   Follow Up Recommendations  Skilled nursing-short term rehab (<3 hours/day)    Assistance Recommended at Discharge Intermittent Supervision/Assistance  Functional Status Assessment  Patient has had a recent decline in their functional status and demonstrates the ability to make significant improvements in function in a reasonable and predictable amount of time.  Equipment Recommendations  BSC/3in1;Wheelchair (measurements OT);Wheelchair cushion (measurements OT) (wheelchair pending pt progress)    Recommendations for Other Services PT consult     Precautions / Restrictions Precautions Precautions: Fall Restrictions Weight Bearing Restrictions: Yes RLE Weight Bearing: Partial weight bearing RLE Partial Weight Bearing Percentage or Pounds: 50%      Mobility Bed Mobility Overal bed  mobility: Needs Assistance Bed Mobility: Rolling;Sit to Supine Rolling: Mod assist Sidelying to sit: HOB elevated;Mod assist   Sit to supine: Mod assist;+2 for physical assistance;HOB elevated   General bed mobility comments: assistance to bring LE's onto bed and scooting toward [facility]    Transfers Overall transfer level: Needs assistance Equipment used: 2 person hand held assist Transfers: Sit to/from Stand;Bed to chair/wheelchair/BSC Sit to Stand: Mod assist;Max assist;+2 physical assistance Stand pivot transfers: Mod assist;Max assist;+2 physical assistance   Step pivot transfers: Max assist;+2 physical assistance;+2 safety/equipment     General transfer comment: cues for pivoting with LE's, reminders for [facility] RLE precautions      Balance Overall balance assessment: Needs assistance Sitting-balance support: Feet supported;Bilateral upper extremity supported Sitting balance-Leahy Scale: Fair     Standing balance support: During functional activity;Bilateral upper extremity supported Standing balance-Leahy Scale: Poor Standing balance comment: external support needed                           ADL either performed or assessed with clinical judgement   ADL Overall ADL's : Needs assistance/impaired Eating/Feeding: Set up;Sitting   Grooming: Set up;Sitting   Upper Body Bathing: Minimal assistance;Moderate assistance;Sitting   Lower Body Bathing: Moderate assistance;Maximal assistance;Sitting/lateral leans   Upper Body Dressing : Minimal assistance;Moderate assistance;Sitting   Lower Body Dressing: Moderate assistance;Maximal assistance;Sitting/lateral leans   Toilet Transfer: BSC/3in1;Maximal assistance;+2 for physical assistance Toilet Transfer Details (indicate cue type and reason): simulated toilet transfer Gail and Hygiene: Moderate assistance;Maximal assistance;Sitting/lateral lean         General ADL Comments: pt notes she  sponge bathes at bed-level regularly     Vision   Vision Assessment?: No apparent visual deficits  Perception     Praxis      Pertinent Vitals/Pain Pain Assessment: 0-10 Pain Score: 5  Faces Pain Scale: Hurts little more Pain Location: RLE Pain Descriptors / Indicators: Sore;Operative site guarding;Grimacing;Guarding Pain Intervention(s): Limited activity within patient's tolerance;Monitored during session;Repositioned     Hand Dominance Right   Extremity/Trunk Assessment Upper Extremity Assessment Upper Extremity Assessment: Generalized weakness   Lower Extremity Assessment Lower Extremity Assessment: Defer to PT evaluation RLE Deficits / Details: Limited Ankle AROM/toe movement, able to perform QS, post surgical deficits. RLE Sensation: history of peripheral neuropathy;decreased light touch (distal to knee halfway from shin down to foot) LLE Sensation: history of peripheral neuropathy;decreased light touch (distal to knee halfway from shin down to foot)       Communication Communication Communication: No difficulties   Cognition Arousal/Alertness: Awake/alert Behavior During Therapy: WFL for tasks assessed/performed Overall Cognitive Status: Within Functional Limits for tasks assessed                                 General Comments: for basic mobility tasks; eager to return home despite less than optimal performance during session.     General Comments  pt eager to return home, open to [facility] therapy, would prefer not to go to rehab    Exercises General Exercises - Lower Extremity Ankle Circles/Pumps: AROM;Both;10 reps;Supine Quad Sets: AROM;Both;Supine (x3)   Shoulder Instructions      Home Living Family/patient expects to be discharged to:: Skilled nursing facility Living Arrangements: Spouse/significant other Available Help at Discharge: Family;Available 24 hours/day Type of Home: House Home Access: Other (comment);Level entry (1 small  step in garage)     Home Layout: One level     Bathroom Shower/Tub: Teacher, early years/pre: Handicapped height     Home Equipment: Conservation officer, nature (2 wheels);Rollator (4 wheels);Shower seat   Additional Comments: pt has walkers and canes at home      Prior Functioning/Environment Prior Level of Function : Independent/Modified Independent             Mobility Comments: uses rollator at baseline ADLs Comments: pt reports sponge bathing at baseline, independent with ADLs        OT Problem List: Decreased strength;Decreased range of motion;Decreased activity tolerance;Impaired balance (sitting and/or standing);Decreased coordination;Decreased safety awareness;Pain      OT Treatment/Interventions: Self-care/ADL training;Therapeutic activities;DME and/or AE instruction;Therapeutic exercise;Balance training;Patient/family education    OT Goals(Current goals can be found in the care plan section) Acute Rehab OT Goals Patient Stated Goal: return home OT Goal Formulation: With patient Time For Goal Achievement: 04/09/21 Potential to Achieve Goals: Fair ADL Goals Pt Will Perform Upper Body Dressing: with supervision;sitting Pt Will Perform Lower Body Dressing: with min assist;sitting/lateral leans Pt Will Transfer to Toilet: with min assist;bedside commode  OT Frequency: Min 3X/week   Barriers to D/C:            Co-evaluation              AM-PAC OT "6 Clicks" Daily Activity     Outcome Measure Help from another person eating meals?: None Help from another person taking care of personal grooming?: None Help from another person toileting, which includes using toliet, bedpan, or urinal?: A Lot Help from another person bathing (including washing, rinsing, drying)?: A Lot Help from another person to put on and taking off regular upper body clothing?: A Little Help from another person to put on  and taking off regular lower body clothing?: A Lot 6 Click  Score: 17   End of Session Equipment Utilized During Treatment: Gait belt Nurse Communication: Mobility status;Other (comment) (asked about need for KI in room)  Activity Tolerance: Patient tolerated treatment well Patient left: in bed;with call bell/phone within reach;with bed alarm set;with nursing/sitter in room (NT in room)  OT Visit Diagnosis: Unsteadiness on feet (R26.81);Other abnormalities of gait and mobility (R26.89);Muscle weakness (generalized) (M62.81);Pain                Time: 0964-3838 OT Time Calculation (min): 33 min Charges:  OT General Charges $OT Visit: 1 Visit OT Evaluation $OT Eval Low Complexity: 1 Low OT Treatments $Self Care/Home Management : 8-22 mins  Lynnda Child, OTD, OTR/L Acute Rehab [phone]) [phone] - [place] 03/26/2021, 11:47 AM

## 2021-03-26 NOTE — Progress Notes (Signed)
Notified Dr. Bridgett Larsson, Randall Hiss regarding patient critical Potassium level of 6.3; not hemolyzed according to the lab. Lab result given by Arta Bruce from lab. New order received from Dr. Bridgett Larsson to redraw BMP stat.

## 2021-03-26 NOTE — TOC CAGE-AID Note (Signed)
Transition of Care [facility]) - CAGE-AID Screening   Patient Details  Name: Angela Castro MRN: [id number] Date of Birth: 01/05/1941  Transition of Care [facility]) CM/SW Contact:    Gaetano Hawthorne Tarpley-Carter, LCSWA Phone Number: 03/26/2021, 2:51 PM   Clinical Narrative: Pt participated in [place].  Pt stated she does not use substance or ETOH.  Pt was not offered resources, due to no usage of substance or ETOH.    Kyia Rhude Tarpley-Carter, MSW, LCSW-A Pronouns:  She/Her/Hers [facility]Transitions of [facility] Clinical Social Worker Direct Number:  [phone] [email or web address]  CAGE-AID Screening:    Have You Ever Felt You Ought to Cut Down on Your Drinking or Drug Use?: No Have People Annoyed You By [employer] Your Drinking Or Drug Use?: No Have You Felt Bad Or Guilty About Your Drinking Or Drug Use?: No Have You Ever Had a Drink or Used Drugs First Thing In The Morning to Steady Your Nerves or to Get Rid of a Hangover?: No CAGE-AID Score: 0  Substance Abuse Education Offered: No

## 2021-03-27 LAB — BASIC METABOLIC PANEL
Anion gap: 9 (ref 5–15)
BUN: 40 mg/dL — ABNORMAL HIGH (ref 8–23)
CO2: 19 mmol/L — ABNORMAL LOW (ref 22–32)
Calcium: 8.4 mg/dL — ABNORMAL LOW (ref 8.9–10.3)
Chloride: 105 mmol/L (ref 98–111)
Creatinine, Ser: 1.82 mg/dL — ABNORMAL HIGH (ref 0.44–1.00)
GFR, Estimated: 28 mL/min — ABNORMAL LOW (ref >60)
Glucose, Bld: 275 mg/dL — ABNORMAL HIGH (ref 70–99)
Potassium: 4.6 mmol/L (ref 3.5–5.1)
Sodium: 133 mmol/L — ABNORMAL LOW (ref 135–145)

## 2021-03-27 LAB — CBC
HCT: 30.5 % — ABNORMAL LOW (ref 36.0–46.0)
Hemoglobin: 10.2 g/dL — ABNORMAL LOW (ref 12.0–15.0)
MCH: 31.5 pg (ref 26.0–34.0)
MCHC: 33.4 g/dL (ref 30.0–36.0)
MCV: 94.1 fL (ref 80.0–100.0)
Platelets: 182 10*3/uL (ref 150–400)
RBC: 3.24 MIL/uL — ABNORMAL LOW (ref 3.87–5.11)
RDW: 16.3 % — ABNORMAL HIGH (ref 11.5–15.5)
WBC: 10.3 10*3/uL (ref 4.0–10.5)
nRBC: 0.3 % — ABNORMAL HIGH (ref 0.0–0.2)

## 2021-03-27 LAB — GLUCOSE, CAPILLARY
Glucose-Capillary: 181 mg/dL — ABNORMAL HIGH (ref 70–99)
Glucose-Capillary: 194 mg/dL — ABNORMAL HIGH (ref 70–99)
Glucose-Capillary: 244 mg/dL — ABNORMAL HIGH (ref 70–99)
Glucose-Capillary: 199 mg/dL — ABNORMAL HIGH (ref 70–99)

## 2021-03-27 MED ORDER — GABAPENTIN 300 MG PO CAPS
300.0000 mg | ORAL_CAPSULE | Freq: Two times a day (BID) | ORAL | Status: DC
  Administered 2021-03-27 – 2021-03-28 (×3): 300 mg via ORAL
  Filled 2021-03-27 (×3): qty 1

## 2021-03-27 MED ORDER — INSULIN GLARGINE-YFGN 100 UNIT/ML ~~LOC~~ SOLN
50.0000 [IU] | Freq: Every day | SUBCUTANEOUS | Status: DC
  Administered 2021-03-27 – 2021-03-28 (×2): 50 [IU] via SUBCUTANEOUS
  Filled 2021-03-27 (×2): qty 0.5

## 2021-03-27 MED ORDER — GLIPIZIDE ER 2.5 MG PO TB24
2.5000 mg | ORAL_TABLET | Freq: Every day | ORAL | Status: DC
  Administered 2021-03-28: 10:00:00 2.5 mg via ORAL
  Filled 2021-03-27 (×2): qty 1

## 2021-03-27 MED ORDER — LINAGLIPTIN 5 MG PO TABS
5.0000 mg | ORAL_TABLET | Freq: Every day | ORAL | Status: DC
  Administered 2021-03-27 – 2021-03-28 (×2): 5 mg via ORAL
  Filled 2021-03-27 (×2): qty 1

## 2021-03-27 NOTE — Plan of Care (Signed)

## 2021-03-27 NOTE — Progress Notes (Signed)
Mobility Specialist Progress Note   03/27/21 1440  Mobility  Activity Transferred to/from [facility];Transferred:  Chair to bed  Level of Assistance Moderate assist, patient does 50-74% (+2 SPT)  Assistive Device None  RLE Weight Bearing PWB  RLE Partial Weight Bearing Percentage or Pounds 50  Distance Ambulated (ft) 2 ft  Mobility Out of bed for toileting  Mobility Response Tolerated well  Mobility performed by Mobility specialist  $Mobility charge 1 Mobility   Received pt in chair needing to use BSC and ready to get back in bed. Pt had no initial complaints before transfer or during. Pt was left w/ OT on the EOB to finish session.  Please read OT note for further detail of transfer/session.  Holland Falling Mobility Specialist Phone Number [phone]

## 2021-03-27 NOTE — Plan of Care (Signed)
  Problem: Education: Goal: Knowledge of General Education information will improve Description: Including pain rating scale, medication(s)/side effects and non-pharmacologic comfort measures Outcome: Progressing   Problem: Health Behavior/Discharge Planning: Goal: Ability to manage health-related needs will improve Outcome: Progressing   Problem: Clinical Measurements: Goal: Ability to maintain clinical measurements within normal limits will improve Outcome: Progressing   Problem: Activity: Goal: Risk for activity intolerance will decrease Outcome: Progressing   Problem: Coping: Goal: Level of anxiety will decrease Outcome: Progressing   Problem: Safety: Goal: Ability to remain free from injury will improve Outcome: Progressing   

## 2021-03-27 NOTE — Progress Notes (Signed)
Orthopaedic Trauma Progress Note  SUBJECTIVE: Doing ok this AM. Pain manageable. Patient slightly frustrated and discouraged with her progress, was hoping to be able to mobilize better than she is currently. I discussed with her that her surgery for a distal femur fracture is much different than a knee replacement and that often it takes a little bit longer to recover in terms of mobility. Although the fracture has been stabilized with the plate and screws, the bone has to heal as well.  She is understanding of this but remains discouraged.  No SOB. No nausea/vomiting. No other complaints. Patient hopeful to return home, would like to avoid rehab facility if able.   OBJECTIVE:  Vitals:   03/26/21 1304 03/26/21 1930  BP: (!) 149/72 139/70  Pulse: (!) 102 99  Resp: 18 19  Temp: 97.7 F (36.5 C) 98.6 F (37 C)  SpO2: 94% 96%    General: Laying in bed, NAD Respiratory: No increased work of breathing.  RLE: Dressing removed, incisions CDI. Tender over distal thigh as expected does not tolerate any knee motion currently. Ankle DF/PF limited actively, passively she tolerates dorsiflexion to beyond neutral. Decreased sensation about the foot and ankle. Neuropathy at baseline, equal to contralateral side. Able to wiggle toes. +DP pulse. Compartment soft and compressible.  IMAGING: Stable post op imaging.   LABS:  Results for orders placed or performed during the hospital encounter of 03/24/21 (from the past 24 hour(s))  Glucose, capillary     Status: Abnormal   Collection Time: 03/26/21  8:52 AM  Result Value Ref Range   Glucose-Capillary 367 (H) 70 - 99 mg/dL  Glucose, capillary     Status: Abnormal   Collection Time: 03/26/21 11:26 AM  Result Value Ref Range   Glucose-Capillary 372 (H) 70 - 99 mg/dL  Glucose, capillary     Status: Abnormal   Collection Time: 03/26/21  4:17 PM  Result Value Ref Range   Glucose-Capillary 384 (H) 70 - 99 mg/dL  Glucose, capillary     Status: Abnormal    Collection Time: 03/26/21  7:29 PM  Result Value Ref Range   Glucose-Capillary 326 (H) 70 - 99 mg/dL  Basic metabolic panel     Status: Abnormal   Collection Time: 03/27/21 12:59 AM  Result Value Ref Range   Sodium 133 (L) 135 - 145 mmol/L   Potassium 4.6 3.5 - 5.1 mmol/L   Chloride 105 98 - 111 mmol/L   CO2 19 (L) 22 - 32 mmol/L   Glucose, Bld 275 (H) 70 - 99 mg/dL   BUN 40 (H) 8 - 23 mg/dL   Creatinine, Ser 1.82 (H) 0.44 - 1.00 mg/dL   Calcium 8.4 (L) 8.9 - 10.3 mg/dL   GFR, Estimated 28 (L) >60 mL/min   Anion gap 9 5 - 15  CBC     Status: Abnormal   Collection Time: 03/27/21 12:59 AM  Result Value Ref Range   WBC 10.3 4.0 - 10.5 K/uL   RBC 3.24 (L) 3.87 - 5.11 MIL/uL   Hemoglobin 10.2 (L) 12.0 - 15.0 g/dL   HCT 30.5 (L) 36.0 - 46.0 %   MCV 94.1 80.0 - 100.0 fL   MCH 31.5 26.0 - 34.0 pg   MCHC 33.4 30.0 - 36.0 g/dL   RDW 16.3 (H) 11.5 - 15.5 %   Platelets 182 150 - 400 K/uL   nRBC 0.3 (H) 0.0 - 0.2 %    ASSESSMENT: Angela Castro is a 80 y.o. female, 2 Days  Post-Op s/p ORIF RIGHT DISTAL FEMUR FRACTURE  CV/Blood loss: Acute blood loss anemia, Hgb 10.2 this morning.   PLAN: Weightbearing:  50% PWB RLE   ROM: No ROM restrictions Incisional and dressing care:  Ok to leave open ot air Showering: Ok to begin showering 03/28/21 with assistance Orthopedic device(s): None  Pain management:  1. Tylenol 325-650 mg q 6 hours PRN 2. Tramadol 50 mg BID 3. Percocet 5-325 mg q 6 hours PRN 4. Neurontin 400 mg TID 5. Dilaudid 0.5 mg q 4 hours PRN VTE prophylaxis: Lovenox, SCDs ID:  Ancef 2gm post op completed Foley/Lines:  No foley, KVO IVFs Impediments to Fracture Healing: Vit D level 33, no supplementation needed Dispo: PT/OT evals ongoing. Recommending [place] vs SNF. Will continue to monitor progress for appropriate discharge venue. Patient ok for d/c from ortho standpoint once cleared by medical team and therapies.  Follow - up plan: 2 weeks for repeat x-rays  Contact  information:  Katha Hamming MD, Patrecia Pace PA-C. After hours and holidays please check [email or web address] for group call information for [facility]   Denae Zulueta A. Ricci Barker, PA-C [phone] (office) [email or web address]

## 2021-03-27 NOTE — Care Management Important Message (Signed)
Important Message  Patient Details  Name: Angela Castro MRN: [id number] Date of Birth: 04-13-1941   Medicare Important Message Given:  Yes     [name] Montine Circle 03/27/2021, 3:09 PM

## 2021-03-27 NOTE — Progress Notes (Signed)
Physical Therapy Treatment Patient Details Name: Angela Castro MRN: [id number] DOB: 1940-09-30 Today's Date: 03/27/2021   History of Present Illness Patient is a 80 y/o female who presents with right periprosthetic femur fx after falling out of her car s/p ORIF 03/25/21.  PMH includes HTN, DM, first MT amputation.    PT Comments    Pt giving great effort and very motivated to return home instead of going to SNF. Pt remains to require maxAX2 for OOB mobility, pt aware spouse can't provide that level of assist. Pt is progressing daily and was able to complete std pvt with face to face transfer with PT and tech today while maintaining upright posture compared to yesterday. Acute PT to cont to follow to progress mobility.    Recommendations for follow up therapy are one component of a multi-disciplinary discharge planning process, led by the attending physician.  Recommendations may be updated based on patient status, additional functional criteria and insurance authorization.  Follow Up Recommendations  Skilled nursing-short term rehab (<3 hours/day)     Assistance Recommended at Discharge Frequent or constant Supervision/Assistance  Equipment Recommendations  BSC/3in1    Recommendations for Other Services       Precautions / Restrictions Precautions Precautions: Fall Precaution Comments: R LE PWB, pt with neuropathy in all fingertips Restrictions Weight Bearing Restrictions: Yes RLE Weight Bearing: Partial weight bearing RLE Partial Weight Bearing Percentage or Pounds: 50     Mobility  Bed Mobility Overal bed mobility: Needs Assistance Bed Mobility: Supine to Sit     Supine to sit: Mod assist     General bed mobility comments: with max verbal cues pt able to bring R LE to EOB, modA for trunk elevation and to scoot hips to EOB    Transfers Overall transfer level: Needs assistance Equipment used: 2 person hand held assist Transfers: Sit to/from Stand;Bed to  chair/wheelchair/BSC Sit to Stand: Max assist;+2 physical assistance Stand pivot transfers: Max assist;+2 physical assistance         General transfer comment: pt pulled upon PT and tech, pvted on L ball of foot to [facility] but then was able to take some steps to chair from BSC,pt very dependent on therapist and tech, minimal R LE WBing    Ambulation/Gait               General Gait Details: unable   Stairs             Wheelchair Mobility    Modified Rankin (Stroke Patients Only)       Balance Overall balance assessment: Needs assistance Sitting-balance support: Feet supported;Bilateral upper extremity supported Sitting balance-Leahy Scale: Fair     Standing balance support: During functional activity;Bilateral upper extremity supported Standing balance-Leahy Scale: Poor Standing balance comment: external support needed                            Cognition Arousal/Alertness: Awake/alert Behavior During Therapy: WFL for tasks assessed/performed Overall Cognitive Status: Within Functional Limits for tasks assessed                                          Exercises General Exercises - Lower Extremity Ankle Circles/Pumps: AROM;Both;10 reps;Supine Quad Sets: AROM;Both;Supine Long Arc Quad: AAROM;Right;10 reps;Seated    General Comments General comments (skin integrity, edema, etc.): pt asssited to [facility], pt dependent for hygiene,  pt maintainted standing x1 min for RN to peform pericare      Pertinent Vitals/Pain Pain Assessment: 0-10 Pain Score: 5  Pain Descriptors / Indicators: Sore;Operative site guarding;Grimacing;Guarding Pain Intervention(s): Monitored during session (RN brought in pain meds at end of session)    Home Living                          Prior Function            PT Goals (current goals can now be found in the care plan section) Acute Rehab PT Goals Patient Stated Goal: pt reports having a bad  night, stating "I really just want to go home" Progress towards PT goals: Progressing toward goals    Frequency    Min 3X/week      PT Plan Current plan remains appropriate    Co-evaluation              AM-PAC PT "6 Clicks" Mobility   Outcome Measure  Help needed turning from your back to your side while in a flat bed without using bedrails?: A Lot Help needed moving from lying on your back to sitting on the side of a flat bed without using bedrails?: A Lot Help needed moving to and from a bed to a chair (including a wheelchair)?: Total Help needed standing up from a chair using your arms (e.g., wheelchair or bedside chair)?: A Lot Help needed to walk in hospital room?: Total Help needed climbing 3-5 steps with a railing? : Total 6 Click Score: 9    End of Session Equipment Utilized During Treatment: Gait belt Activity Tolerance: Patient limited by pain;Patient limited by fatigue Patient left: in chair;with call bell/phone within reach;with family/visitor present;with nursing/sitter in room Nurse Communication: Mobility status;Patient requests pain meds PT Visit Diagnosis: Pain;Muscle weakness (generalized) (M62.81);Difficulty in walking, not elsewhere classified (R26.2);Unsteadiness on feet (R26.81) Pain - Right/Left: Right Pain - part of body: Knee     Time: 2353-6144 PT Time Calculation (min) (ACUTE ONLY): 24 min  Charges:  $Therapeutic Exercise: 8-22 mins $Therapeutic Activity: 8-22 mins                     Kittie Plater, PT, DPT Acute Rehabilitation Services Pager #: [phone] Office #: [phone]    Berline Lopes 03/27/2021, 10:58 AM

## 2021-03-27 NOTE — TOC Progression Note (Signed)
Transition of Care [facility]) - Progression Note    Patient Details  Name: Angela Castro MRN: [id number] Date of Birth: [birth year]  Transition of Care [facility]) CM/SW Contact  Joanne Chars, LCSW Phone Number: 03/27/2021, 6:55 PM  Clinical Narrative:  Josem Kaufmann request uploaded to [facility].  Needs facility choice.  Need to confirm that [facility] manages this [employer].       Expected Discharge Plan: [place] Barriers to Discharge: Continued Medical Work up  Expected Discharge Plan and Services Expected Discharge Plan: [place]   Discharge Planning Services: CM Consult   Living arrangements for the past 2 months: Single Family Home                                       Social Determinants of Health (SDOH) Interventions    Readmission Risk Interventions No flowsheet data found.

## 2021-03-27 NOTE — Progress Notes (Signed)
Pt's right hand IV was removed after pt, and pt's spouse, continuously asked for it to be taken out. Pt had no complaints of pain or discomfort at the site, and instead stated that she just didn't want to have two. Pt was educated on the need for the IV's by RN, CN and other disciplinarians but insisted on it being taken out. Pt was informed that if the IV in her left forearm becomes compromised, we will have to start a new IV for access and she verbalized understanding of this teaching.

## 2021-03-27 NOTE — Progress Notes (Signed)
Occupational Therapy Treatment Patient Details Name: Angela Castro MRN: [id number] DOB: July 25, 1940 Today's Date: 03/27/2021   History of present illness Patient is a 80 y/o female who presents with right periprosthetic femur fx after falling out of her car s/p ORIF 03/25/21.  PMH includes HTN, DM, first MT amputation.   OT comments  Per nursing pt requesting to get to [facility] upon arrival, max A +2 for stand pivot transfer x2, able to adhere to [facility] precautions. Pt able to complete pericare with supervision while seated on BSC. Pt reports less RLE pain during transfers compared to previous session. Pt requesting LE exercises, per PT note, reviewed LE exercises while long sitting in bed. Spouse present, eager to help, educated spouse on PROM, ways to assist movement due to pt's LE neuropathy. Pt presenting with impaired balance, mobility, activity tolerance, ROM, and pain at this time, will continue to follow acutely to maximize safety with ADLs. Recommend SNF at d/c.   Recommendations for follow up therapy are one component of a multi-disciplinary discharge planning process, led by the attending physician.  Recommendations may be updated based on patient status, additional functional criteria and insurance authorization.    Follow Up Recommendations  Skilled nursing-short term rehab (<3 hours/day)    Assistance Recommended at Discharge Frequent or constant Supervision/Assistance  Equipment Recommendations  BSC/3in1;Wheelchair (measurements OT);Wheelchair cushion (measurements OT)    Recommendations for Other Services PT consult    Precautions / Restrictions Precautions Precautions: Fall Precaution Comments: RLE PWB Restrictions Weight Bearing Restrictions: Yes RLE Weight Bearing: Partial weight bearing RLE Partial Weight Bearing Percentage or Pounds: 50%       Mobility Bed Mobility Overal bed mobility: Needs Assistance Bed Mobility: Sit to Supine       Sit to supine: Mod  assist;HOB elevated   General bed mobility comments: assistance needed to bring RLE onto bed    Transfers Overall transfer level: Needs assistance Equipment used: 2 person hand held assist Transfers: Sit to/from Stand;Bed to chair/wheelchair/BSC Sit to Stand: Max assist;+2 physical assistance           General transfer comment: pt reports less pain in RLE during transfer     Balance Overall balance assessment: Needs assistance Sitting-balance support: Feet supported;Bilateral upper extremity supported Sitting balance-Leahy Scale: Fair     Standing balance support: During functional activity;Bilateral upper extremity supported Standing balance-Leahy Scale: Zero Standing balance comment: external support needed                           ADL either performed or assessed with clinical judgement   ADL Overall ADL's : Needs assistance/impaired                         Toilet Transfer: BSC/3in1;Maximal assistance;+2 for physical assistance Toilet Transfer Details (indicate cue type and reason): simulated toilet transfer Toileting- Clothing Manipulation and Hygiene: Supervision/safety;Sitting/lateral lean Toileting - Clothing Manipulation Details (indicate cue type and reason): completed on [facility]     Functional mobility during ADLs: Maximal assistance;+2 for physical assistance;Cueing for sequencing      Extremity/Trunk Assessment Upper Extremity Assessment Upper Extremity Assessment: Generalized weakness   Lower Extremity Assessment Lower Extremity Assessment: Defer to PT evaluation        Vision   Vision Assessment?: No apparent visual deficits   Perception     Praxis      Cognition Arousal/Alertness: Awake/alert Behavior During Therapy: WFL for tasks assessed/performed Overall Cognitive  Status: Within Functional Limits for tasks assessed                                 General Comments: pt remains eager to d/c, more open to  postacute rehab          Exercises Exercises: General Lower Extremity (reviewed LE exercises with pt) General Exercises - Lower Extremity Ankle Circles/Pumps: Both;10 reps;Supine;AAROM Quad Sets: AROM;Both;Supine   Shoulder Instructions       General Comments pt spouse in room, present entire session    Pertinent Vitals/ Pain       Pain Assessment: Faces Pain Score: 2  Faces Pain Scale: Hurts a little bit Breathing: normal Negative Vocalization: none Facial Expression: smiling or inexpressive Body Language: relaxed Consolability: no need to console PAINAD Score: 0 Facial Expression: Relaxed, neutral Body Movements: Absence of movements Muscle Tension: Relaxed Compliance with ventilator (intubated pts.): N/A Pain Location: RLE Pain Descriptors / Indicators: Sore;Operative site guarding;Grimacing;Guarding Pain Intervention(s): Limited activity within patient's tolerance;Monitored during session;Repositioned  Home Living                                          Prior Functioning/Environment              Frequency  Min 3X/week        Progress Toward Goals  OT Goals(current goals can now be found in the care plan section)  Progress towards OT goals: Progressing toward goals  Acute Rehab OT Goals Patient Stated Goal: return home OT Goal Formulation: With patient Time For Goal Achievement: 04/09/21 Potential to Achieve Goals: Fair ADL Goals Pt Will Perform Upper Body Dressing: with supervision;sitting Pt Will Perform Lower Body Dressing: with min assist;sitting/lateral leans Pt Will Transfer to Toilet: with min assist;bedside commode  Plan Discharge plan remains appropriate;Frequency remains appropriate    Co-evaluation                 AM-PAC OT "6 Clicks" Daily Activity     Outcome Measure   Help from another person eating meals?: None Help from another person taking care of personal grooming?: None Help from another person  toileting, which includes using toliet, bedpan, or urinal?: A Lot Help from another person bathing (including washing, rinsing, drying)?: A Lot Help from another person to put on and taking off regular upper body clothing?: A Little Help from another person to put on and taking off regular lower body clothing?: Total 6 Click Score: 16    End of Session Equipment Utilized During Treatment: Gait belt  OT Visit Diagnosis: Unsteadiness on feet (R26.81);Other abnormalities of gait and mobility (R26.89);Muscle weakness (generalized) (M62.81);Pain Pain - Right/Left: Right Pain - part of body: Leg   Activity Tolerance Patient tolerated treatment well   Patient Left in bed;with call bell/phone within reach;with bed alarm set;with family/visitor present   Nurse Communication Mobility status        Time: 1735-6701 OT Time Calculation (min): 36 min  Charges: OT General Charges $OT Visit: 1 Visit OT Treatments $Self Care/Home Management : 8-22 mins $Therapeutic Activity: 8-22 mins  Lynnda Child, OTD, OTR/L Acute Rehab [phone] - [place] 03/27/2021, 3:14 PM

## 2021-03-27 NOTE — NC FL2 (Signed)
[place] LEVEL OF CARE SCREENING TOOL     IDENTIFICATION  Patient Name: Angela Castro Birthdate: June 13, 1940 Sex: female Admission Date (Current Location): 03/24/2021  [facility] and [place] Number:  Herbalist and Address:  [facility] [place], [place] [address] [place], [place] [address]      Provider Number: [id number]  Attending Physician Name and Address:  Terrilee Croak, MD  Relative Name and Phone Number:       Current Level of Care: Hospital Recommended Level of Care: [place] Prior Approval Number:    Date Approved/Denied:   PASRR Number:  [id number]  Discharge Plan: SNF    Current Diagnoses: Patient Active Problem List   Diagnosis Date Noted   Femur fracture ([place]) 03/24/2021   Closed fracture of right distal femur ([place]) 03/24/2021   CKD (chronic kidney disease) stage 3, GFR 30-59 ml/min ([place]) 03/24/2021   Bilateral lower extremity edema 03/24/2021   Fall    Osteomyelitis ([place]) 08/24/2016   Weakness 08/24/2016   Diabetes mellitus type 2 in obese ([place]) 08/24/2016   Acute-on-chronic renal failure ([place]) 08/24/2016   Foot ulcer with fat layer exposed, left ([place]) 08/19/2016   Medication monitoring encounter 08/19/2016   Chronic osteomyelitis ([place]) 07/29/2016   Diabetes mellitus, type II, insulin dependent ([place]) 07/14/2016   Gout 07/14/2016   Hypertension, essential 07/14/2016   Generalized osteoarthritis 07/14/2016    Orientation RESPIRATION BLADDER Height & Weight     Self, Time, Situation, Place  Normal Continent Weight: 85.1 kg Height:  5' (152.4 cm)  BEHAVIORAL SYMPTOMS/MOOD NEUROLOGICAL BOWEL NUTRITION STATUS      Continent Diet (refer to discharge summary)  AMBULATORY STATUS COMMUNICATION OF NEEDS Skin   Extensive Assist   Surgical wounds (fx R femur. S/p ORIF 03/25/2021)                       Personal Care Assistance Level of Assistance  Bathing, Feeding, Dressing Bathing Assistance:  Maximum assistance Feeding assistance: Independent Dressing Assistance: Maximum assistance     Functional Limitations Info             [place]  PT (By licensed PT), OT (By licensed OT)     PT Frequency: 5x/week, evaluate and treat OT Frequency: 5x/week, evaluate and treat            Contractures Contractures Info: Present    Additional Factors Info  Code Status, Allergies Code Status Info: Full Code Allergies Info: Sulfa Antibiotics, Adhesive (Tape), Keflex (Cephalexin), Lisinopril, Metronidazole, Teflaro (Ceftaroline           Current Medications (03/27/2021):  This is the current hospital active medication list Current Facility-Administered Medications  Medication Dose Route Frequency Provider Last Rate Last Admin   0.9 %  sodium chloride infusion (Manually program via Guardrails IV Fluids)   Intravenous Once Patrecia Pace A, PA-C       0.9 %  sodium chloride infusion   Intravenous Continuous Delray Alt, PA-C 75 mL/hr at 03/26/21 0036 New Bag at 03/26/21 0036   acetaminophen (TYLENOL) tablet 325-650 mg  325-650 mg Oral Q6H PRN Delray Alt, PA-C       allopurinol (ZYLOPRIM) tablet 100 mg  100 mg Oral BID Florencia Reasons, MD   100 mg at 03/27/21 3151   ascorbic acid (VITAMIN C) tablet 500 mg  500 mg Oral Daily Florencia Reasons, MD   500 mg at 03/27/21 0942   bisoprolol (ZEBETA)  tablet 10 mg  10 mg Oral Daily Florencia Reasons, MD   10 mg at 03/27/21 7062   calcium-vitamin D (OSCAL WITH D) 500MG -200UNIT (5MCG) per tablet 1 tablet  1 tablet Oral Q breakfast Delray Alt, PA-C   1 tablet at 03/27/21 3762   clobetasol ointment (TEMOVATE) 8.31 % 1 application  1 application Topical Daily PRN Florencia Reasons, MD       docusate sodium (COLACE) capsule 100 mg  100 mg Oral BID Patrecia Pace A, PA-C   100 mg at 03/27/21 0942   enoxaparin (LOVENOX) injection 30 mg  30 mg Subcutaneous Q24H Oswald Hillock, MD   30 mg at 03/27/21 5176   ferrous sulfate tablet 325 mg  325 mg Oral  BID WC Florencia Reasons, MD   325 mg at 03/27/21 0942   fluocinonide cream (LIDEX) 1.60 % 1 application  1 application Topical BID PRN Florencia Reasons, MD       gabapentin (NEURONTIN) capsule 300 mg  300 mg Oral BID Terrilee Croak, MD       Derrill Memo ON 03/28/2021] glipiZIDE (GLUCOTROL XL) 24 hr tablet 2.5 mg  2.5 mg Oral Q breakfast Dahal, Binaya, MD       hydrALAZINE (APRESOLINE) tablet 10 mg  10 mg Oral Q6H PRN Florencia Reasons, MD       HYDROmorphone (DILAUDID) injection 0.5 mg  0.5 mg Intravenous Q4H PRN Florencia Reasons, MD   0.5 mg at 03/25/21 1036   insulin aspart (novoLOG) injection 0-15 Units  0-15 Units Subcutaneous TID WC Florencia Reasons, MD   3 Units at 03/27/21 1251   insulin aspart (novoLOG) injection 3 Units  3 Units Subcutaneous TID WC Oswald Hillock, MD   3 Units at 03/27/21 1252   insulin glargine-yfgn (SEMGLEE) injection 50 Units  50 Units Subcutaneous QHS Dahal, Marlowe Aschoff, MD       linagliptin (TRADJENTA) tablet 5 mg  5 mg Oral Daily Dahal, Binaya, MD   5 mg at 03/27/21 0951   loratadine (CLARITIN) tablet 10 mg  10 mg Oral Daily Florencia Reasons, MD   10 mg at 03/27/21 7371   metoCLOPramide (REGLAN) tablet 5-10 mg  5-10 mg Oral Q8H PRN Delray Alt, PA-C       Or   metoCLOPramide (REGLAN) injection 5-10 mg  5-10 mg Intravenous Q8H PRN Delray Alt, PA-C       ondansetron (ZOFRAN) tablet 4 mg  4 mg Oral Q6H PRN Delray Alt, PA-C       Or   ondansetron (ZOFRAN) injection 4 mg  4 mg Intravenous Q6H PRN Delray Alt, PA-C       oxyCODONE-acetaminophen (PERCOCET/ROXICET) 5-325 MG per tablet 1-2 tablet  1-2 tablet Oral Q6H PRN Florencia Reasons, MD   2 tablet at 03/27/21 1257   polyethylene glycol (MIRALAX / GLYCOLAX) packet 17 g  17 g Oral Daily PRN Patrecia Pace A, PA-C       senna-docusate (Senokot-S) tablet 1 tablet  1 tablet Oral QHS PRN Florencia Reasons, MD       simvastatin (ZOCOR) tablet 5 mg  5 mg Oral QHS Florencia Reasons, MD   5 mg at 03/26/21 2121   traMADol (ULTRAM) tablet 50 mg  50 mg Oral BID Florencia Reasons, MD   50 mg at 03/27/21 0626      Discharge Medications: Please see discharge summary for a list of discharge medications.  Relevant Imaging Results:  Relevant Lab Results:   Additional Information SS# [id number]  Sharin Mons, RN

## 2021-03-27 NOTE — Progress Notes (Signed)
PROGRESS NOTE  Angela Castro  DOB: Dec 30, 1940  PCP: London Pepper, MD WCB:[id number]  DOA: 03/24/2021  LOS: 3 days  Hospital Day: 4  Chief Complaint  Patient presents with   Fall   Knee Pain    Right    Brief narrative: Angela Castro is a 80 y.o. female with PMH significant for DM2, HTN, HLD, CKD 3. Patient presented to the ED on 11/13 after a fall while getting out of car. Imagings in the ED showed displaced periprosthetic fracture involving the distal femoral metaphysis. Knee was placed in immobilizer and patient was transferred to [facility].   11/14, patient underwent ORIF of right distal femur fracture See below for details  Subjective: Patient was seen and examined this morning.  Pleasant elderly Caucasian female.  Sitting up in chair.  Not in distress.  Pain controlled.  Husband at bedside.  Last 24 hours, her blood sugar reading has been running over 200.  Insulin dose adjusted.  Assessment/Plan: Right distal femur fracture -s/p ORIF on 11/14 -Pain management and DVT prophylaxis per orthopedics.  Type 2 diabetes mellitus Hyperglycemia -A1c 8.5 in 11/14 -Home meds include Lantus 60 units at bedtime, glipizide and Januvia -Currently on Semglee at 45 units, last dose last night as well as Premeal insulin. Blood sugar level was over 300 last night. -This morning I resumed her Januvia and glipizide and increase the dose of Semglee to 15 units for tonight. Recent Labs  Lab 03/26/21 1617 03/26/21 1929 03/27/21 0850 03/27/21 1245 03/27/21 1614  GLUCAP 384* 326* 181* 194* 244*   AKI on CKD 4 -Baseline creatinine 1.6-1.9. -Creatinine peaked at 2.1 on 11/15.  Improving with IV fluid. Recent Labs    03/24/21 1545 03/25/21 0420 03/25/21 2318 03/26/21 0215 03/26/21 0459 03/27/21 0059  BUN 24* 29*  --  34* 35* 40*  CREATININE 1.53* 1.61* 2.08* 2.00* 2.10* 1.82*   Hyperkalemia -On 11/15, potassium level is elevated to 6.3.  Lokelma given for 2 doses.  Potassium  level back to normal Recent Labs  Lab 03/24/21 1545 03/25/21 0420 03/26/21 0215 03/26/21 0459 03/27/21 0059  K 4.5 5.1 6.3* 6.2* 4.6   Bilateral extremity edema -Lower extremity venous duplex is negative for DVT -Norvasc on hold -Obtain echocardiogram to rule out CHF.  Stop IV fluid   Essential hypertension -Currently on bisoprolol only.  Norvasc on hold because of pedal edema.   -Blood pressure trending up to 160s this afternoon.  Continue hydralazine as needed.     Hyperlipidemia -Continue simvastatin   Mobility: PT eval appreciated Living condition: Was living at home Goals of care:   Code Status: Full Code  Nutritional status: Body mass index is 36.64 kg/m.     Diet:  Diet Order             Diet Heart Room service appropriate? Yes; Fluid consistency: Thin  Diet effective now                  DVT prophylaxis:  enoxaparin (LOVENOX) injection 30 mg Start: 03/27/21 0800 SCDs Start: 03/25/21 2040 Place and maintain sequential compression device Start: 03/24/21 1839 SCDs Start: 03/24/21 1751   Antimicrobials: None Fluid: Currently normal saline at 75 mill per hour.  We will stop IV hydration at this time. Consultants: Orthopedics Family Communication: Husband at bedside  Status is: Inpatient  Remains inpatient appropriate because: Pending SNF  Dispo: The patient is from: Home              Anticipated  d/c is to: SNF              Patient currently is not medically stable to d/c.   Difficult to place patient No     Infusions:     Scheduled Meds:  sodium chloride   Intravenous Once   allopurinol  100 mg Oral BID   vitamin C  500 mg Oral Daily   bisoprolol  10 mg Oral Daily   calcium-vitamin D  1 tablet Oral Q breakfast   docusate sodium  100 mg Oral BID   enoxaparin (LOVENOX) injection  30 mg Subcutaneous Q24H   ferrous sulfate  325 mg Oral BID WC   gabapentin  300 mg Oral BID   [START ON 03/28/2021] glipiZIDE  2.5 mg Oral Q breakfast    insulin aspart  0-15 Units Subcutaneous TID WC   insulin aspart  3 Units Subcutaneous TID WC   insulin glargine-yfgn  50 Units Subcutaneous QHS   linagliptin  5 mg Oral Daily   loratadine  10 mg Oral Daily   simvastatin  5 mg Oral QHS   traMADol  50 mg Oral BID    PRN meds: acetaminophen, clobetasol ointment, fluocinonide cream, hydrALAZINE, HYDROmorphone (DILAUDID) injection, metoCLOPramide **OR** metoCLOPramide (REGLAN) injection, ondansetron **OR** ondansetron (ZOFRAN) IV, oxyCODONE-acetaminophen, polyethylene glycol, senna-docusate   Antimicrobials: Anti-infectives (From admission, onward)    Start     Dose/Rate Route Frequency Ordered Stop   03/26/21 0000  vancomycin (VANCOREADY) IVPB 1000 mg/200 mL        1,000 mg 200 mL/hr over 60 Minutes Intravenous Every 12 hours 03/25/21 2039 03/26/21 0400   03/25/21 1856  vancomycin (VANCOCIN) powder  Status:  Discontinued          As needed 03/25/21 1856 03/25/21 1926   03/25/21 1600  vancomycin (VANCOCIN) IVPB 1000 mg/200 mL premix        1,000 mg 200 mL/hr over 60 Minutes Intravenous On call to O.R. 03/25/21 1548 03/25/21 1656       Objective: Vitals:   03/27/21 0942 03/27/21 1243  BP: (!) 142/79 (!) 163/75  Pulse: 72 87  Resp: 16 17  Temp: 97.6 F (36.4 C) 98.3 F (36.8 C)  SpO2: 98% 93%    Intake/Output Summary (Last 24 hours) at 03/27/2021 1730 Last data filed at 03/27/2021 0958 Gross per 24 hour  Intake --  Output 300 ml  Net -300 ml   Filed Weights   03/25/21 1536 03/25/21 2045  Weight: 77.1 kg 85.1 kg   Weight change:  Body mass index is 36.64 kg/m.   Physical Exam: General exam: Pleasant, elderly Caucasian female.  Not in physical distress Skin: No rashes, lesions or ulcers. HEENT: Atraumatic, normocephalic, no obvious bleeding Lungs: Clear to auscultation bilaterally CVS: Regular rate and rhythm, no murmur GI/Abd soft, nontender, nondistended, bowel sound present CNS: Alert, awake, oriented  x3 Psychiatry: Mood appropriate Extremities: Improving bilateral pedal edema, no calf tenderness  Data Review: I have personally reviewed the laboratory data and studies available.  F/u labs ordered Unresulted Labs (From admission, onward)     Start     Ordered   04/01/21 0500  Creatinine, serum  (enoxaparin (LOVENOX)    CrCl >/= 30 ml/min)  Weekly,   R     Comments: while on enoxaparin therapy    03/25/21 2039   03/26/21 5102  Basic metabolic panel  Daily,   R      03/25/21 2039   03/24/21 1709  Urinalysis, Routine w reflex  microscopic  Once,   R        03/24/21 1708            Signed, Terrilee Croak, MD [facility] 03/27/2021

## 2021-03-27 NOTE — TOC Initial Note (Signed)
Transition of Care [facility]) - Initial/Assessment Note    Patient Details  Name: Angela Castro MRN: [id number] Date of Birth: 1940/08/15  Transition of Care [facility]) CM/SW Contact:    Sharin Mons, RN Phone Number: 03/27/2021, 3:37 PM  Clinical Narrative:         Pt s/p fall, fx R femur. S/p ORIF 03/25/2021. From home with husband, Angela Castro. States PTA independent with ADL's, DME : rolling walker.       RNCM received consult for possible SNF placement at time of discharge. RNCM spoke with patient regarding PT recommendation of SNF placement at time of discharge. Patient reported that patient's spouse Angela Castro is currently unable to care for patient at their home given patient's current physical needs and fall risk. Patient expressed understanding of PT recommendation and is agreeable to SNF placement at time of discharge. Patient reports preference for [facility]. RNCM discussed insurance authorization process and provided Medicare SNF ratings list. Patient expressed being hopeful for rehab and to feel better soon. No further questions reported at this time. RNCM to continue to follow and assist with discharge planning needs.  Pt fully COVID vaccinated , booster x 3.  Expected Discharge Plan: Skilled Nursing Facility Barriers to Discharge: Continued Medical Work up   Patient Goals and CMS Choice   CMS Medicare.gov Compare Post Acute Care list provided to:: Patient    Expected Discharge Plan and Services Expected Discharge Plan: [place]   Discharge Planning Services: CM Consult   Living arrangements for the past 2 months: Single Family Home                                      Prior Living Arrangements/Services Living arrangements for the past 2 months: Single Family Home Lives with:: Spouse Patient language and need for interpreter reviewed:: Yes Do you feel safe going back to the place where you live?: No   i need rehab. 1st  Need for Family Participation in  Patient Care: Yes (Comment) Care giver support system in place?: Yes (comment)   Criminal Activity/Legal Involvement Pertinent to Current Situation/Hospitalization: No - Comment as needed  Activities of Daily Living Home Assistive Devices/Equipment: Eyeglasses, CBG Meter ADL Screening (condition at time of admission) Patient's cognitive ability adequate to safely complete daily activities?: Yes Is the patient deaf or have difficulty hearing?: No Does the patient have difficulty seeing, even when wearing glasses/contacts?: No Does the patient have difficulty concentrating, remembering, or making decisions?: No Patient able to express need for assistance with ADLs?: Yes Does the patient have difficulty dressing or bathing?: Yes (secondary to right leg injury) Independently performs ADLs?: No (secondary to right leg injury) Communication: Independent Dressing (OT): Needs assistance Is this a change from baseline?: Change from baseline, expected to last >3 days Grooming: Independent Feeding: Independent Bathing: Needs assistance Is this a change from baseline?: Change from baseline, expected to last >3 days Toileting: Dependent Is this a change from baseline?: Change from baseline, expected to last >3days In/Out Bed: Dependent Is this a change from baseline?: Change from baseline, expected to last >3 days Walks in Home: Dependent Is this a change from baseline?: Change from baseline, expected to last >3 days Does the patient have difficulty walking or climbing stairs?: Yes (secondary to right leg injury) Weakness of Legs: Right Weakness of Arms/Hands: None  Permission Sought/Granted   Permission granted to share information with : Yes, Verbal  Permission Granted  Share Information with NAME: Angela Castro (Spouse) [phone]           Emotional Assessment Appearance:: Appears stated age Attitude/Demeanor/Rapport: Engaged Affect (typically observed): Accepting Orientation: :  Oriented to Self, Oriented to Place, Oriented to  Time, Oriented to Situation Alcohol / Substance Use: Not Applicable Psych Involvement: No (comment)  Admission diagnosis:  Femur fracture ([place]) [S72.90XA] Fall, initial encounter [W19.XXXA] Closed fracture of distal end of right femur, unspecified fracture morphology, initial encounter ([place]) [S72.401A] Patient Active Problem List   Diagnosis Date Noted   Femur fracture ([place]) 03/24/2021   Closed fracture of right distal femur ([place]) 03/24/2021   CKD (chronic kidney disease) stage 3, GFR 30-59 ml/min ([place]) 03/24/2021   Bilateral lower extremity edema 03/24/2021   Fall    Osteomyelitis ([place]) 08/24/2016   Weakness 08/24/2016   Diabetes mellitus type 2 in obese ([place]) 08/24/2016   Acute-on-chronic renal failure ([place]) 08/24/2016   Foot ulcer with fat layer exposed, left ([place]) 08/19/2016   Medication monitoring encounter 08/19/2016   Chronic osteomyelitis ([place]) 07/29/2016   Diabetes mellitus, type II, insulin dependent ([place]) 07/14/2016   Gout 07/14/2016   Hypertension, essential 07/14/2016   Generalized osteoarthritis 07/14/2016   PCP:  London Pepper, MD Pharmacy:   [facility] [place] - [address] [place] [address] Phone: [phone] Fax: [phone]     Social Determinants of Health (SDOH) Interventions    Readmission Risk Interventions No flowsheet data found.

## 2021-03-28 ENCOUNTER — Inpatient Hospital Stay (HOSPITAL_COMMUNITY): Payer: Medicare PPO

## 2021-03-28 DIAGNOSIS — S72401A Unspecified fracture of lower end of right femur, initial encounter for closed fracture: Secondary | ICD-10-CM | POA: Diagnosis not present

## 2021-03-28 DIAGNOSIS — Z7401 Bed confinement status: Secondary | ICD-10-CM | POA: Diagnosis not present

## 2021-03-28 DIAGNOSIS — E1122 Type 2 diabetes mellitus with diabetic chronic kidney disease: Secondary | ICD-10-CM | POA: Diagnosis not present

## 2021-03-28 DIAGNOSIS — Z9181 History of falling: Secondary | ICD-10-CM | POA: Diagnosis not present

## 2021-03-28 DIAGNOSIS — R9431 Abnormal electrocardiogram [ECG] [EKG]: Secondary | ICD-10-CM

## 2021-03-28 DIAGNOSIS — N179 Acute kidney failure, unspecified: Secondary | ICD-10-CM | POA: Diagnosis not present

## 2021-03-28 DIAGNOSIS — S79929A Unspecified injury of unspecified thigh, initial encounter: Secondary | ICD-10-CM | POA: Diagnosis not present

## 2021-03-28 DIAGNOSIS — R41841 Cognitive communication deficit: Secondary | ICD-10-CM | POA: Diagnosis not present

## 2021-03-28 DIAGNOSIS — R2689 Other abnormalities of gait and mobility: Secondary | ICD-10-CM | POA: Diagnosis not present

## 2021-03-28 DIAGNOSIS — S72491D Other fracture of lower end of right femur, subsequent encounter for closed fracture with routine healing: Secondary | ICD-10-CM | POA: Diagnosis not present

## 2021-03-28 DIAGNOSIS — N184 Chronic kidney disease, stage 4 (severe): Secondary | ICD-10-CM | POA: Diagnosis not present

## 2021-03-28 DIAGNOSIS — M6281 Muscle weakness (generalized): Secondary | ICD-10-CM | POA: Diagnosis not present

## 2021-03-28 DIAGNOSIS — E1165 Type 2 diabetes mellitus with hyperglycemia: Secondary | ICD-10-CM | POA: Diagnosis not present

## 2021-03-28 DIAGNOSIS — R262 Difficulty in walking, not elsewhere classified: Secondary | ICD-10-CM | POA: Diagnosis not present

## 2021-03-28 LAB — ECHOCARDIOGRAM COMPLETE
AR max vel: 1.7 cm2
AV Peak grad: 10.6 mmHg
Ao pk vel: 1.63 m/s
Area-P 1/2: 6.71 cm2
Calc EF: 59 %
Height: 60 in
S' Lateral: 2.5 cm
Single Plane A2C EF: 57 %
Single Plane A4C EF: 59 %
Weight: 3001.78 oz

## 2021-03-28 LAB — BASIC METABOLIC PANEL
Anion gap: 7 (ref 5–15)
BUN: 35 mg/dL — ABNORMAL HIGH (ref 8–23)
CO2: 24 mmol/L (ref 22–32)
Calcium: 9 mg/dL (ref 8.9–10.3)
Chloride: 110 mmol/L (ref 98–111)
Creatinine, Ser: 1.59 mg/dL — ABNORMAL HIGH (ref 0.44–1.00)
GFR, Estimated: 33 mL/min — ABNORMAL LOW (ref >60)
Glucose, Bld: 163 mg/dL — ABNORMAL HIGH (ref 70–99)
Potassium: 4.4 mmol/L (ref 3.5–5.1)
Sodium: 141 mmol/L (ref 135–145)

## 2021-03-28 LAB — GLUCOSE, CAPILLARY
Glucose-Capillary: 87 mg/dL (ref 70–99)
Glucose-Capillary: 148 mg/dL — ABNORMAL HIGH (ref 70–99)
Glucose-Capillary: 176 mg/dL — ABNORMAL HIGH (ref 70–99)
Glucose-Capillary: 198 mg/dL — ABNORMAL HIGH (ref 70–99)

## 2021-03-28 LAB — RESP PANEL BY RT-PCR (FLU A&B, COVID) ARPGX2
Influenza A by PCR: NEGATIVE
Influenza B by PCR: NEGATIVE
SARS Coronavirus 2 by RT PCR: NEGATIVE

## 2021-03-28 MED ORDER — OXYCODONE-ACETAMINOPHEN 5-325 MG PO TABS
1.0000 | ORAL_TABLET | Freq: Four times a day (QID) | ORAL | 0 refills | Status: AC | PRN
Start: 2021-03-28 — End: 2021-03-31

## 2021-03-28 MED ORDER — ENOXAPARIN SODIUM 30 MG/0.3ML IJ SOSY: 30.0000 mg | PREFILLED_SYRINGE | INTRAMUSCULAR | Status: DC

## 2021-03-28 MED ORDER — GABAPENTIN 300 MG PO CAPS: 300.0000 mg | ORAL_CAPSULE | Freq: Two times a day (BID) | ORAL | Status: DC

## 2021-03-28 MED ORDER — DOCUSATE SODIUM 100 MG PO CAPS: 100.0000 mg | ORAL_CAPSULE | Freq: Two times a day (BID) | ORAL | 0 refills | Status: DC

## 2021-03-28 MED ORDER — LANTUS SOLOSTAR 100 UNIT/ML ~~LOC~~ SOPN: 50.0000 [IU] | PEN_INJECTOR | Freq: Every day | SUBCUTANEOUS | Status: DC

## 2021-03-28 NOTE — Progress Notes (Signed)
Pt is discharged to [facility] via [place]. AVS documentation and discharge summary given. Pt's spouse present during discharge. Pt in NAD.

## 2021-03-28 NOTE — Progress Notes (Signed)
Mobility Specialist Progress Note   03/28/21 1700  Mobility  Activity Ambulated in room  Level of Assistance Maximum assist, patient does 25-49% (+2)  Assistive Device Front wheel walker  RLE Weight Bearing PWB  Distance Ambulated (ft) 5 ft  Mobility Ambulated with assistance in room  Mobility Response Tolerated well  Mobility performed by Mobility specialist   Received pt in bed having no complaints and agreeable to mobility. Bed mobility is mod independent, STS is maxA for physical assistance d/t lack of strength in RLE. Ambulated fwd and backwards for 5 steps, returned back in bed w/ call bell by side, bed alarm on and husband in the room.     [place] Mobility Specialist Phone Number [phone]

## 2021-03-28 NOTE — TOC Transition Note (Signed)
Transition of Care [facility]) - CM/SW Discharge Note   Patient Details  Name: Angela Castro MRN: [id number] Date of Birth: 1940/10/29  [facility] CM/SW Contact:  Joanne Chars, LCSW Phone Number: 03/28/2021, 1:30 PM   Clinical Narrative:   Pt discharging to [facility].  RN call [phone] for report.      Final next level of care: Skilled Nursing Facility Barriers to Discharge: Barriers Resolved   Patient Goals and CMS Choice   CMS Medicare.gov Compare Post Acute Care list provided to:: Patient    Discharge Placement              Patient chooses bed at:  [facility]) Patient to be transferred to facility by: [place] Name of family member notified: husband Angela Castro Patient and family notified of of transfer: 03/28/21  Discharge Plan and Services   Discharge Planning Services: CM Consult                                 Social Determinants of Health ([place]) Interventions     Readmission Risk Interventions No flowsheet data found.

## 2021-03-28 NOTE — Discharge Summary (Addendum)
Physician Discharge Summary  Angela Castro VPX:[id number] DOB: [birth year] DOA: 03/24/2021  PCP: London Pepper, MD  Admit date: 03/24/2021 Discharge date: 03/28/2021  Admitted From: Home Discharge disposition: SNF   Code Status: Full Code   Discharge Diagnosis:   Principal Problem:   Closed fracture of right distal femur ([place]) Active Problems:   Diabetes mellitus, type II, insulin dependent ([place])   Gout   Hypertension, essential   Femur fracture ([place])   CKD (chronic kidney disease) stage 3, GFR 30-59 ml/min (HCC)   Bilateral lower extremity edema   Chief Complaint  Patient presents with   Fall   Knee Pain    Right    Brief narrative: Angela Castro is a 80 y.o. female with PMH significant for DM2, HTN, HLD, CKD 3. Patient presented to the ED on 11/13 after a fall while getting out of car. Imagings in the ED showed displaced periprosthetic fracture involving the distal femoral metaphysis. Knee was placed in immobilizer and patient was transferred to [facility].   11/14, patient underwent ORIF of right distal femur fracture See below for details.  Subjective: Patient was seen and examined this morning.   Pleasant elderly Caucasian female.  Sitting up in bed..  Not in distress.  Pain controlled.  Husband at bedside.  Last 24 hours, blood sugar level is better controlled, it was 87 this morning.  Hospital course: Right distal femur fracture -s/p ORIF on 11/14 -Pain management per orthopedics. -Lovenox subcu for 4 weeks for DVT prophylaxis  Type 2 diabetes mellitus Hyperglycemia -A1c 8.5 in 11/14 -Home meds include Lantus 60 units at bedtime, glipizide and Januvia. -Blood sugar level was elevated for several days and insulin dose was adjusted.  Blood sugar level this morning 87. -At discharge will continue glipizide, Januvia and Lantus at a lower dose of 50 units nightly. Recent Labs  Lab 03/27/21 1245 03/27/21 1614 03/27/21 2021 03/28/21 0856 03/28/21 1145   GLUCAP 194* 244* 199* 87 148*   AKI on CKD 4 -Baseline creatinine 1.6-1.9. -Creatinine peaked at 2.1 on 11/15.  Improved to baseline with IV fluid. Recent Labs    03/24/21 1545 03/25/21 0420 03/25/21 2318 03/26/21 0215 03/26/21 0459 03/27/21 0059 03/28/21 0118  BUN 24* 29*  --  34* 35* 40* 35*  CREATININE 1.53* 1.61* 2.08* 2.00* 2.10* 1.82* 1.59*   Hyperkalemia -On 11/15, potassium level is elevated to 6.3.  Lokelma given for 2 doses.  Potassium level back to normal Recent Labs  Lab 03/25/21 0420 03/26/21 0215 03/26/21 0459 03/27/21 0059 03/28/21 0118  K 5.1 6.3* 6.2* 4.6 4.4   Bilateral extremity edema -Lower extremity venous duplex is negative for DVT   Essential hypertension -Currently on bisoprolol only.  Resume Norvasc and losartan post discharge.   Hyperlipidemia -Continue simvastatin    Allergies as of 03/28/2021       Reactions   Sulfa Antibiotics Shortness Of Breath, Rash   Adhesive [tape] Other (See Comments)   Causes redness, please use paper tape   Keflex [cephalexin] Other (See Comments)   Possibly caused lethargy (taken with Flagyl)   Lisinopril Cough   Metronidazole Other (See Comments)   Possibly caused lethargy (taken with keflex)   Teflaro [ceftaroline] Other (See Comments)   Lethargy, possibly rash        Medication List     STOP taking these medications    senna-docusate 8.6-50 MG tablet Commonly known as: Senokot-S   traMADol 50 MG tablet Commonly known as: Veatrice Bourbon  TAKE these medications    allopurinol 100 MG tablet Commonly known as: ZYLOPRIM Take 100 mg by mouth 2 (two) times daily.   amLODipine 5 MG tablet Commonly known as: NORVASC Take 5 mg by mouth every morning.   aspirin EC 81 MG tablet Take 81 mg by mouth at bedtime.   bisoprolol 10 MG tablet Commonly known as: ZEBETA Take 10 mg by mouth in the morning.   Calcium-D 600-400 MG-UNIT Tabs Take 1 tablet by mouth every morning.   clobetasol 0.05  % external solution Commonly known as: TEMOVATE Apply 1 application topically daily as needed (scalp itching/psoriasis). What changed: Another medication with the same name was removed. Continue taking this medication, and follow the directions you see here.   docusate sodium 100 MG capsule Commonly known as: COLACE Take 1 capsule (100 mg total) by mouth 2 (two) times daily.   enoxaparin 30 MG/0.3ML injection Commonly known as: LOVENOX Inject 0.3 mLs (30 mg total) into the skin daily for 28 days. Start taking on: March 29, 2021   ferrous sulfate 325 (65 FE) MG tablet Commonly known as: FerrouSul Take 1 tablet (325 mg total) by mouth 2 (two) times daily with a meal.   fluocinonide cream 0.05 % Commonly known as: LIDEX Apply 1 application topically 2 (two) times daily as needed (psoriasis).   gabapentin 300 MG capsule Commonly known as: NEURONTIN Take 1 capsule (300 mg total) by mouth 2 (two) times daily. What changed:  medication strength how much to take when to take this   glipiZIDE 2.5 MG 24 hr tablet Commonly known as: GLUCOTROL XL Take 2.5 mg by mouth daily with breakfast.   Lantus SoloStar 100 UNIT/ML Solostar Pen Generic drug: insulin glargine Inject 50 Units into the skin at bedtime. What changed:  how much to take additional instructions   loratadine 10 MG tablet Commonly known as: CLARITIN Take 10 mg by mouth every morning.   losartan 25 MG tablet Commonly known as: COZAAR Take 25 mg by mouth every morning.   MULTIVITAMIN/IRON PO Take 1 tablet by mouth every morning.   oxyCODONE-acetaminophen 5-325 MG tablet Commonly known as: PERCOCET/ROXICET Take 1 tablet by mouth every 6 (six) hours as needed for up to 3 days for moderate pain or severe pain. What changed: how much to take   simvastatin 5 MG tablet Commonly known as: ZOCOR Take 5 mg by mouth at bedtime.   sitaGLIPtin 50 MG tablet Commonly known as: JANUVIA Take 50 mg by mouth every  morning.   vitamin C 500 MG tablet Commonly known as: ASCORBIC ACID Take 500 mg by mouth every morning.               Discharge Care Instructions  (From admission, onward)           Start     Ordered   03/28/21 0000  If the dressing is still on your incision site when you go home, remove it on the third day after your surgery date. Remove dressing if it begins to fall off, or if it is dirty or damaged before the third day.        03/28/21 1015            Discharge Instructions:  Diet Recommendation:  Discharge Diet Orders (From admission, onward)     Start     Ordered   03/28/21 0000  Diet - low sodium heart healthy        03/28/21 1015   03/28/21 0000  Diet Carb Modified        03/28/21 1015              @BRDDSCINSTRUCTIONS @  Follow ups:    Follow-up Information     London Pepper, MD Follow up.   Specialty: Family Medicine Contact information: [place] [id number] [phone]         Shona Needles, MD Follow up.   Specialty: Orthopedic Surgery Why: Follow-up in 2 weeks for repeat x-ray. Contact information: [place] [address] [phone]                 Wound care:   Wound / Incision (Open or Dehisced) 08/24/16 Diabetic ulcer Foot Left (Active)  Date First Assessed/Time First Assessed: 08/24/16 1844   Wound Type: Diabetic ulcer  Location: Foot  Location Orientation: Left  Present on Admission: Yes    Assessments 08/24/2016  6:05 PM 08/26/2016  8:05 AM  Dressing Type Gauze (Comment);Non adherent Gauze (Comment)  Dressing Changed Changed Reinforced  Dressing Status Clean;Dry;Intact Clean;Dry;Intact  Dressing Change Frequency PRN --  Site / Wound Assessment Yellow;Pink;Red Dressing in place / Unable to assess  % Wound base Red or Granulating 75% --  Drainage Amount Minimal None  Drainage Description Serosanguineous --     No Linked orders to display     Incision  (Closed) 08/26/16 Foot Left (Active)  Date First Assessed/Time First Assessed: 08/26/16 1333   Location: Foot  Location Orientation: Left    Assessments 08/26/2016  1:53 PM 08/28/2016  9:00 AM  Dressing Type Gauze (Comment);Compression wrap Gauze (Comment);Compression wrap  Dressing Clean;Dry;Intact Intact  Site / Wound Assessment -- Dressing in place / Unable to assess     No Linked orders to display     Incision (Closed) 03/25/21 Leg Right (Active)  Date First Assessed/Time First Assessed: 03/25/21 1907   Location: Leg  Location Orientation: Right    Assessments 03/25/2021  7:35 PM 03/28/2021  8:52 AM  Dressing Type Compression wrap None  Dressing Clean;Dry;Intact --  Site / Wound Assessment Clean;Dry Clean;Dry  Margins Attached edges (approximated) --  Closure Skin glue --  Drainage Amount None --     No Linked orders to display    Discharge Exam:   Vitals:   03/27/21 1243 03/27/21 2018 03/28/21 0919 03/28/21 1143  BP: (!) 163/75 (!) 157/66 (!) 166/89 (!) 147/80  Pulse: 87 96 100 87  Resp: 17 17 18 17   Temp: 98.3 F (36.8 C) 98 F (36.7 C) 98.5 F (36.9 C) 99 F (37.2 C)  TempSrc:  Oral Oral Oral  SpO2: 93% 100% 92% 96%  Weight:      Height:        Body mass index is 36.64 kg/m.  General exam: Pleasant, elderly Caucasian female.  Sitting up in bed.  Not in distress Skin: No rashes, lesions or ulcers. HEENT: Atraumatic, normocephalic, no obvious bleeding Lungs: Clear to auscultation bilaterally CVS: Regular rate and rhythm, no murmur GI/Abd soft, nontender, nondistended, bowel sound present CNS: Alert, awake, oriented x3 Psychiatry: Mood appropriate Extremities: Trace bilateral pedal edema, improving, no calf tenderness  Time coordinating discharge: 35 minutes   The results of significant diagnostics from this hospitalization (including imaging, microbiology, ancillary and laboratory) are listed below for reference.    Procedures and Diagnostic Studies:    CT KNEE RIGHT WO CONTRAST  Result Date: 03/24/2021 CLINICAL DATA:  Right knee periprosthetic femur fracture. EXAM: CT OF THE RIGHT  KNEE WITHOUT CONTRAST TECHNIQUE: Multidetector CT imaging of the right knee was performed according to the standard protocol. Multiplanar CT image reconstructions were also generated. COMPARISON:  Right knee x-rays from same day. FINDINGS: Bones/Joint/Cartilage Right total knee arthroplasty. Acute comminuted periprosthetic fracture of the distal femoral metaphysis again noted with up to 2.5 cm lateral displacement, 1.9 cm posterior displacement, and 1.9 cm overriding. There is a nondisplaced longitudinal component extending superiorly into the diaphysis along the posterior cortex (series 604, image 24). No dislocation. No joint effusion. Ligaments Ligaments are suboptimally evaluated by CT. Muscles and Tendons Grossly intact. Moderate atrophy of the visualized lower leg muscles below the knee. Soft tissue Diffuse soft tissue swelling. No fluid collection or hematoma. No soft tissue mass. IMPRESSION: 1. Acute comminuted and displaced periprosthetic fracture of the distal femoral metaphysis as described above. Electronically Signed   By: Titus Dubin M.D.   On: 03/24/2021 16:47   DG Knee Complete 4 Views Right  Result Date: 03/24/2021 CLINICAL DATA:  Right knee pain after fall. EXAM: RIGHT KNEE - COMPLETE 4+ VIEW COMPARISON:  None. FINDINGS: Right total knee arthroplasty. No periprosthetic lucency. Acute periprosthetic fracture of the distal femoral metaphysis with 3.9 cm lateral displacement, 1.8 cm posterior displacement, and 1.8 cm of overriding. No dislocation. Diffuse soft tissue swelling. IMPRESSION: 1. Acute displaced periprosthetic fracture of the distal femoral metaphysis. Electronically Signed   By: Titus Dubin M.D.   On: 03/24/2021 15:34   DG Knee Right Port  Result Date: 03/25/2021 CLINICAL DATA:  Postop films.  ORIF of right femur fracture. EXAM:  PORTABLE RIGHT KNEE - 1-2 VIEW COMPARISON:  X-ray intraoperative 03/25/2021, CT knee 03/24/2021 FINDINGS: Redemonstration of a total right knee arthroplasty. Interval placement of a mid to distal femoral metadiaphysis plate and screw fixation. Improved anatomical alignment of the known comminuted femoral fracture. No evidence of new fracture or dislocation. Gas and fluid noted within the joint space consistent with postsurgical changes. Subcutaneus soft tissue edema and emphysema consistent postsurgical changes. IMPRESSION: Postop femoral fixation with plate and screw fixation in a patient with a total right knee arthroplasty. Electronically Signed   By: Iven Finn M.D.   On: 03/25/2021 20:24   DG C-Arm 1-60 Min-No Report  Result Date: 03/25/2021 Fluoroscopy was utilized by the requesting physician.  No radiographic interpretation.   DG FEMUR, MIN 2 VIEWS RIGHT  Result Date: 03/25/2021 CLINICAL DATA:  Distal right femoral fracture EXAM: RIGHT FEMUR 2 VIEWS COMPARISON:  03/24/2021 FLUOROSCOPY TIME:  Radiation Exposure Index (as provided by the fluoroscopic device): 4 mGy If the device does not provide the exposure index: Fluoroscopy Time:  45 seconds Number of Acquired Images:  8 FINDINGS: Initial images again demonstrate the distal right femoral periprosthetic fracture. Fracture fragments were subsequently reduced and lateral fixation sideplate was placed with proximal and distal fixation screws. Fracture fragments are in near anatomic alignment. IMPRESSION: ORIF of distal right femoral fracture. Electronically Signed   By: Inez Catalina M.D.   On: 03/25/2021 19:57   VAS [place] LOWER EXTREMITY VENOUS (DVT)  Result Date: 03/25/2021  Lower Venous DVT Study Patient Name:  PEG FIFER  Date of Exam:   03/25/2021 Medical Rec #: [id number]       Accession #:    [id number] Date of Birth: 1940-05-26       Patient Gender: F Patient Age:   49 years Exam Location[facility]  [facility] Procedure:      VAS [place]  LOWER EXTREMITY VENOUS (DVT) Referring Phys: Annamaria Boots  XU --------------------------------------------------------------------------------  Indications: Edema.  Risk Factors: Trauma Fall with RLE femur fracture 03/24/2021. Limitations: Poor ultrasound/tissue interface and body habitus. Comparison Study: No previous exams Performing Technologist: Jody Hill RVT, RDMS  Examination Guidelines: A complete evaluation includes B-mode imaging, spectral Doppler, color Doppler, and power Doppler as needed of all accessible portions of each vessel. Bilateral testing is considered an integral part of a complete examination. Limited examinations for reoccurring indications may be performed as noted. The reflux portion of the exam is performed with the patient in reverse Trendelenburg.  +---------+---------------+---------+-----------+----------+-------------------+ RIGHT    CompressibilityPhasicitySpontaneityPropertiesThrombus Aging      +---------+---------------+---------+-----------+----------+-------------------+ CFV      Full           Yes      Yes                                      +---------+---------------+---------+-----------+----------+-------------------+ SFJ      Full                                                             +---------+---------------+---------+-----------+----------+-------------------+ FV Prox  Full           Yes      Yes                                      +---------+---------------+---------+-----------+----------+-------------------+ FV Mid   Full           Yes      Yes                                      +---------+---------------+---------+-----------+----------+-------------------+ FV DistalFull           Yes      Yes                                      +---------+---------------+---------+-----------+----------+-------------------+ PFV      Full                                                              +---------+---------------+---------+-----------+----------+-------------------+ POP      Full           Yes      Yes                                      +---------+---------------+---------+-----------+----------+-------------------+ PTV      Full                                         Not well visualized +---------+---------------+---------+-----------+----------+-------------------+ PERO  Full                                                             +---------+---------------+---------+-----------+----------+-------------------+   +---------+---------------+---------+-----------+----------+--------------+ LEFT     CompressibilityPhasicitySpontaneityPropertiesThrombus Aging +---------+---------------+---------+-----------+----------+--------------+ CFV      Full           Yes      Yes                                 +---------+---------------+---------+-----------+----------+--------------+ SFJ      Full                                                        +---------+---------------+---------+-----------+----------+--------------+ FV Prox  Full           Yes      Yes                                 +---------+---------------+---------+-----------+----------+--------------+ FV Mid   Full           Yes      Yes                                 +---------+---------------+---------+-----------+----------+--------------+ FV DistalFull           Yes      Yes                                 +---------+---------------+---------+-----------+----------+--------------+ PFV      Full                                                        +---------+---------------+---------+-----------+----------+--------------+ POP      Full           Yes      Yes                                 +---------+---------------+---------+-----------+----------+--------------+ PTV      Full                                                         +---------+---------------+---------+-----------+----------+--------------+ PERO     Full                                                        +---------+---------------+---------+-----------+----------+--------------+  Summary: BILATERAL: - No evidence of deep vein thrombosis seen in the lower extremities, bilaterally. - No evidence of superficial venous thrombosis in the lower extremities, bilaterally. -No evidence of popliteal cyst, bilaterally.   *See table(s) above for measurements and observations. Electronically signed by Monica Martinez MD on 03/25/2021 at 12:01:50 PM.    Final      Labs:   Basic Metabolic Panel: Recent Labs  Lab 03/25/21 0420 03/25/21 2318 03/26/21 0215 03/26/21 0459 03/27/21 0059 03/28/21 0118  NA 141  --  136 138 133* 141  K 5.1  --  6.3* 6.2* 4.6 4.4  CL 108  --  105 105 105 110  CO2 25  --  21* 22 19* 24  GLUCOSE 241*  --  389* 377* 275* 163*  BUN 29*  --  34* 35* 40* 35*  CREATININE 1.61* 2.08* 2.00* 2.10* 1.82* 1.59*  CALCIUM 8.4*  --  8.2* 8.7* 8.4* 9.0   GFR Estimated Creatinine Clearance: 27.8 mL/min (A) (by C-G formula based on SCr of 1.59 mg/dL (H)). Liver Function Tests: Recent Labs  Lab 03/24/21 1545  AST 22  ALT 17  ALKPHOS 83  BILITOT 0.5  PROT 6.7  ALBUMIN 3.7   No results for input(s): LIPASE, AMYLASE in the last 168 hours. No results for input(s): AMMONIA in the last 168 hours. Coagulation profile No results for input(s): INR, PROTIME in the last 168 hours.  CBC: Recent Labs  Lab 03/24/21 1545 03/25/21 0420 03/25/21 2318 03/26/21 0215 03/27/21 0059  WBC 11.4* 7.1 8.6 7.3 10.3  NEUTROABS 9.0*  --   --   --   --   HGB 10.7* 7.6* 11.8* 10.6* 10.2*  HCT 32.4* 23.4* 36.0 32.6* 30.5*  MCV 98.2 100.9* 94.5 95.9 94.1  PLT 233 161 160 163 182   Cardiac Enzymes: No results for input(s): CKTOTAL, CKMB, CKMBINDEX, TROPONINI in the last 168 hours. BNP: Invalid input(s): POCBNP CBG: Recent Labs  Lab  03/27/21 1245 03/27/21 1614 03/27/21 2021 03/28/21 0856 03/28/21 1145  GLUCAP 194* 244* 199* 87 148*   D-Dimer No results for input(s): DDIMER in the last 72 hours. Hgb A1c No results for input(s): HGBA1C in the last 72 hours. Lipid Profile No results for input(s): CHOL, HDL, LDLCALC, TRIG, CHOLHDL, LDLDIRECT in the last 72 hours. Thyroid function studies No results for input(s): TSH, T4TOTAL, T3FREE, THYROIDAB in the last 72 hours.  Invalid input(s): FREET3 Anemia work up No results for input(s): VITAMINB12, FOLATE, FERRITIN, TIBC, IRON, RETICCTPCT in the last 72 hours. Microbiology Recent Results (from the past 240 hour(s))  Resp Panel by RT-PCR (Flu A&B, Covid) Nasopharyngeal Swab     Status: None   Collection Time: 03/24/21  3:45 PM   Specimen: Nasopharyngeal Swab; Nasopharyngeal(NP) swabs in vial transport medium  Result Value Ref Range Status   SARS Coronavirus 2 by RT PCR NEGATIVE NEGATIVE Final    Comment: (NOTE) SARS-CoV-2 target nucleic acids are NOT DETECTED.  The SARS-CoV-2 RNA is generally detectable in upper respiratory specimens during the acute phase of infection. The lowest concentration of SARS-CoV-2 viral copies this assay can detect is 138 copies/mL. A negative result does not preclude SARS-Cov-2 infection and should not be used as the sole basis for treatment or other patient management decisions. A negative result may occur with  improper specimen collection/handling, submission of specimen other than nasopharyngeal swab, presence of viral mutation(s) within the areas targeted by this assay, and inadequate number of viral copies(<138 copies/mL). A negative result must  be combined with clinical observations, patient history, and epidemiological information. The expected result is Negative.  Fact Sheet for Patients:  EntrepreneurPulse.com.au  Fact Sheet for Healthcare Providers:  IncredibleEmployment.be  This test  is no t yet approved or cleared by the [place] FDA and  has been authorized for detection and/or diagnosis of SARS-CoV-2 by FDA under an Emergency Use Authorization (EUA). This EUA will remain  in effect (meaning this test can be used) for the duration of the COVID-19 declaration under Section 564(b)(1) of the Act, 21 U.S.C.section 360bbb-3(b)(1), unless the authorization is terminated  or revoked sooner.       Influenza A by PCR NEGATIVE NEGATIVE Final   Influenza B by PCR NEGATIVE NEGATIVE Final    Comment: (NOTE) The Xpert Xpress SARS-CoV-2/FLU/RSV plus assay is intended as an aid in the diagnosis of influenza from Nasopharyngeal swab specimens and should not be used as a sole basis for treatment. Nasal washings and aspirates are unacceptable for Xpert Xpress SARS-CoV-2/FLU/RSV testing.  Fact Sheet for Patients: EntrepreneurPulse.com.au  Fact Sheet for Healthcare Providers: IncredibleEmployment.be  This test is not yet approved or cleared by the [place] FDA and has been authorized for detection and/or diagnosis of SARS-CoV-2 by FDA under an Emergency Use Authorization (EUA). This EUA will remain in effect (meaning this test can be used) for the duration of the COVID-19 declaration under Section 564(b)(1) of the Act, 21 U.S.C. section 360bbb-3(b)(1), unless the authorization is terminated or revoked.  Performed at [facility], [place] [address], [place], [place] [address]   Resp Panel by RT-PCR (Flu A&B, Covid) Nasopharyngeal Swab     Status: None   Collection Time: 03/28/21 11:10 AM   Specimen: Nasopharyngeal Swab; Nasopharyngeal(NP) swabs in vial transport medium  Result Value Ref Range Status   SARS Coronavirus 2 by RT PCR NEGATIVE NEGATIVE Final    Comment: (NOTE) SARS-CoV-2 target nucleic acids are NOT DETECTED.  The SARS-CoV-2 RNA is generally detectable in upper respiratory specimens during the acute  phase of infection. The lowest concentration of SARS-CoV-2 viral copies this assay can detect is 138 copies/mL. A negative result does not preclude SARS-Cov-2 infection and should not be used as the sole basis for treatment or other patient management decisions. A negative result may occur with  improper specimen collection/handling, submission of specimen other than nasopharyngeal swab, presence of viral mutation(s) within the areas targeted by this assay, and inadequate number of viral copies(<138 copies/mL). A negative result must be combined with clinical observations, patient history, and epidemiological information. The expected result is Negative.  Fact Sheet for Patients:  EntrepreneurPulse.com.au  Fact Sheet for Healthcare Providers:  IncredibleEmployment.be  This test is no t yet approved or cleared by the [place] FDA and  has been authorized for detection and/or diagnosis of SARS-CoV-2 by FDA under an Emergency Use Authorization (EUA). This EUA will remain  in effect (meaning this test can be used) for the duration of the COVID-19 declaration under Section 564(b)(1) of the Act, 21 U.S.C.section 360bbb-3(b)(1), unless the authorization is terminated  or revoked sooner.       Influenza A by PCR NEGATIVE NEGATIVE Final   Influenza B by PCR NEGATIVE NEGATIVE Final    Comment: (NOTE) The Xpert Xpress SARS-CoV-2/FLU/RSV plus assay is intended as an aid in the diagnosis of influenza from Nasopharyngeal swab specimens and should not be used as a sole basis for treatment. Nasal washings and aspirates are unacceptable for Xpert Xpress SARS-CoV-2/FLU/RSV testing.  Fact Sheet  for Patients: EntrepreneurPulse.com.au  Fact Sheet for Healthcare Providers: IncredibleEmployment.be  This test is not yet approved or cleared by the [place] FDA and has been authorized for detection and/or diagnosis of  SARS-CoV-2 by FDA under an Emergency Use Authorization (EUA). This EUA will remain in effect (meaning this test can be used) for the duration of the COVID-19 declaration under Section 564(b)(1) of the Act, 21 U.S.C. section 360bbb-3(b)(1), unless the authorization is terminated or revoked.  Performed at [facility], [place] [address], [place], [place] [address]      Signed: Terrilee Croak  [facility] 03/28/2021, 4:11 PM

## 2021-03-28 NOTE — Progress Notes (Signed)
Physical Therapy Treatment Patient Details Name: Angela Castro MRN: [id number] DOB: 12-27-1940 Today's Date: 03/28/2021   History of Present Illness Patient is a 80 y/o female who presents with right periprosthetic femur fx after falling out of her car s/p ORIF 03/25/21.  PMH includes HTN, DM, first MT amputation.    PT Comments    Pt admitted with above diagnosis. Pt was able to ambulate with mod assist of 2 a short distance in the room with mod cues as well. Pt is limited by poor endurance and PWB right LE is difficult for pt to adhere to when she fatigues.  Pt currently with functional limitations due to balance and endurance deficits. Pt will benefit from skilled PT to increase their independence and safety with mobility to allow discharge to the venue listed below.      Recommendations for follow up therapy are one component of a multi-disciplinary discharge planning process, led by the attending physician.  Recommendations may be updated based on patient status, additional functional criteria and insurance authorization.  Follow Up Recommendations  Skilled nursing-short term rehab (<3 hours/day)     Assistance Recommended at Discharge Frequent or constant Supervision/Assistance  Equipment Recommendations  BSC/3in1    Recommendations for Other Services       Precautions / Restrictions Precautions Precautions: Fall Precaution Comments: RLE PWB Restrictions RLE Weight Bearing: Partial weight bearing RLE Partial Weight Bearing Percentage or Pounds: 50     Mobility  Bed Mobility Overal bed mobility: Needs Assistance Bed Mobility: Supine to Sit Rolling: Min guard Sidelying to sit: Min assist       General bed mobility comments: less assist to come to EOB, assistance with moving right LE off bed to intiiate the movement    Transfers Overall transfer level: Needs assistance Equipment used: Rolling walker (2 wheels) Transfers: Sit to/from Stand;Bed to  chair/wheelchair/BSC Sit to Stand: +2 physical assistance;Mod assist;From elevated surface           General transfer comment: Pt needed mod assist to power up. Obtained a shorter walker so that pt could get better push with UEs.    Ambulation/Gait Ambulation/Gait assistance: Mod assist;+2 physical assistance Gait Distance (Feet): 5 Feet Assistive device: Rolling walker (2 wheels) Gait Pattern/deviations: Step-to pattern;Decreased step length - right;Decreased stance time - right;Decreased weight shift to right;Trunk flexed   Gait velocity interpretation: <1.31 ft/sec, indicative of household ambulator   General Gait Details: Pt was able to take a few steps needing cues for sequencing and to stand up tall. Requires Mod assist of 2 as well. Pt with weakness in bil UEs due to neuropathy and does not grip the RW well.  May benefit from platforms for RW. Pt also needed cues for PWB 50% as she fatigues.  Pt keeps left foot turned out.   Stairs             Wheelchair Mobility    Modified Rankin (Stroke Patients Only)       Balance Overall balance assessment: Needs assistance Sitting-balance support: Feet supported;Bilateral upper extremity supported Sitting balance-Leahy Scale: Fair     Standing balance support: During functional activity;Bilateral upper extremity supported Standing balance-Leahy Scale: Zero Standing balance comment: external support needed                            Cognition Arousal/Alertness: Awake/alert Behavior During Therapy: WFL for tasks assessed/performed Overall Cognitive Status: Within Functional Limits for tasks assessed  General Comments: pt remains eager to d/c, more open to postacute rehab        Exercises General Exercises - Lower Extremity Ankle Circles/Pumps: Both;10 reps;Supine;AAROM Quad Sets: AROM;Both;Supine Long Arc Quad: AAROM;Right;10 reps;Seated Heel Slides:  AROM;Both;10 reps;Supine Hip ABduction/ADduction: AROM;Both;10 reps;Supine    General Comments        Pertinent Vitals/Pain Pain Assessment: No/denies pain    Home Living                          Prior Function            PT Goals (current goals can now be found in the care plan section) Progress towards PT goals: Progressing toward goals    Frequency    Min 3X/week      PT Plan Current plan remains appropriate    Co-evaluation              AM-PAC PT "6 Clicks" Mobility   Outcome Measure  Help needed turning from your back to your side while in a flat bed without using bedrails?: A Lot Help needed moving from lying on your back to sitting on the side of a flat bed without using bedrails?: A Lot Help needed moving to and from a bed to a chair (including a wheelchair)?: Total Help needed standing up from a chair using your arms (e.g., wheelchair or bedside chair)?: Total Help needed to walk in hospital room?: Total Help needed climbing 3-5 steps with a railing? : Total 6 Click Score: 8    End of Session Equipment Utilized During Treatment: Gait belt Activity Tolerance: Patient limited by pain;Patient limited by fatigue Patient left: in chair;with call bell/phone within reach;with family/visitor present;with nursing/sitter in room Nurse Communication: Mobility status PT Visit Diagnosis: Pain;Muscle weakness (generalized) (M62.81);Difficulty in walking, not elsewhere classified (R26.2);Unsteadiness on feet (R26.81) Pain - Right/Left: Right Pain - part of body: Knee     Time: 1022-1052 PT Time Calculation (min) (ACUTE ONLY): 30 min  Charges:  $Gait Training: 8-22 mins $Therapeutic Exercise: 8-22 mins                     Angela Castro M,PT Acute Rehab Services [phone] (pager)    Angela Castro 03/28/2021, 1:38 PM

## 2021-03-28 NOTE — Progress Notes (Signed)
Attempted to call report to [facility], no answer.

## 2021-04-01 DIAGNOSIS — E1122 Type 2 diabetes mellitus with diabetic chronic kidney disease: Secondary | ICD-10-CM | POA: Diagnosis not present

## 2021-04-01 DIAGNOSIS — R262 Difficulty in walking, not elsewhere classified: Secondary | ICD-10-CM | POA: Diagnosis not present

## 2021-04-01 DIAGNOSIS — N184 Chronic kidney disease, stage 4 (severe): Secondary | ICD-10-CM | POA: Diagnosis not present

## 2021-04-01 DIAGNOSIS — S72491D Other fracture of lower end of right femur, subsequent encounter for closed fracture with routine healing: Secondary | ICD-10-CM | POA: Diagnosis not present

## 2021-04-08 DIAGNOSIS — N184 Chronic kidney disease, stage 4 (severe): Secondary | ICD-10-CM | POA: Diagnosis not present

## 2021-04-08 DIAGNOSIS — Z794 Long term (current) use of insulin: Secondary | ICD-10-CM | POA: Diagnosis not present

## 2021-04-08 DIAGNOSIS — S72491D Other fracture of lower end of right femur, subsequent encounter for closed fracture with routine healing: Secondary | ICD-10-CM | POA: Diagnosis not present

## 2021-04-08 DIAGNOSIS — I129 Hypertensive chronic kidney disease with stage 1 through stage 4 chronic kidney disease, or unspecified chronic kidney disease: Secondary | ICD-10-CM | POA: Diagnosis not present

## 2021-04-08 DIAGNOSIS — Z9181 History of falling: Secondary | ICD-10-CM | POA: Diagnosis not present

## 2021-04-08 DIAGNOSIS — E1165 Type 2 diabetes mellitus with hyperglycemia: Secondary | ICD-10-CM | POA: Diagnosis not present

## 2021-04-08 DIAGNOSIS — Z7984 Long term (current) use of oral hypoglycemic drugs: Secondary | ICD-10-CM | POA: Diagnosis not present

## 2021-04-11 DIAGNOSIS — N1832 Chronic kidney disease, stage 3b: Secondary | ICD-10-CM | POA: Diagnosis not present

## 2021-04-11 DIAGNOSIS — E1140 Type 2 diabetes mellitus with diabetic neuropathy, unspecified: Secondary | ICD-10-CM | POA: Diagnosis not present

## 2021-04-11 DIAGNOSIS — I10 Essential (primary) hypertension: Secondary | ICD-10-CM | POA: Diagnosis not present

## 2021-04-11 DIAGNOSIS — E785 Hyperlipidemia, unspecified: Secondary | ICD-10-CM | POA: Diagnosis not present

## 2021-04-11 DIAGNOSIS — H35033 Hypertensive retinopathy, bilateral: Secondary | ICD-10-CM | POA: Diagnosis not present

## 2021-04-11 DIAGNOSIS — E1121 Type 2 diabetes mellitus with diabetic nephropathy: Secondary | ICD-10-CM | POA: Diagnosis not present

## 2021-04-11 DIAGNOSIS — E1165 Type 2 diabetes mellitus with hyperglycemia: Secondary | ICD-10-CM | POA: Diagnosis not present

## 2021-04-16 DIAGNOSIS — S72451D Displaced supracondylar fracture without intracondylar extension of lower end of right femur, subsequent encounter for closed fracture with routine healing: Secondary | ICD-10-CM | POA: Diagnosis not present

## 2021-04-17 DIAGNOSIS — Z09 Encounter for follow-up examination after completed treatment for conditions other than malignant neoplasm: Secondary | ICD-10-CM | POA: Diagnosis not present

## 2021-04-17 DIAGNOSIS — N1832 Chronic kidney disease, stage 3b: Secondary | ICD-10-CM | POA: Diagnosis not present

## 2021-04-17 DIAGNOSIS — S7290XA Unspecified fracture of unspecified femur, initial encounter for closed fracture: Secondary | ICD-10-CM | POA: Diagnosis not present

## 2021-04-17 DIAGNOSIS — Z96659 Presence of unspecified artificial knee joint: Secondary | ICD-10-CM | POA: Diagnosis not present

## 2021-04-17 DIAGNOSIS — D649 Anemia, unspecified: Secondary | ICD-10-CM | POA: Diagnosis not present

## 2021-04-17 DIAGNOSIS — Z794 Long term (current) use of insulin: Secondary | ICD-10-CM | POA: Diagnosis not present

## 2021-04-17 DIAGNOSIS — E1165 Type 2 diabetes mellitus with hyperglycemia: Secondary | ICD-10-CM | POA: Diagnosis not present

## 2021-05-14 DIAGNOSIS — S72451D Displaced supracondylar fracture without intracondylar extension of lower end of right femur, subsequent encounter for closed fracture with routine healing: Secondary | ICD-10-CM | POA: Diagnosis not present

## 2021-05-16 DIAGNOSIS — D649 Anemia, unspecified: Secondary | ICD-10-CM | POA: Diagnosis not present

## 2021-05-16 DIAGNOSIS — N1832 Chronic kidney disease, stage 3b: Secondary | ICD-10-CM | POA: Diagnosis not present

## 2021-05-16 DIAGNOSIS — E1165 Type 2 diabetes mellitus with hyperglycemia: Secondary | ICD-10-CM | POA: Diagnosis not present

## 2021-05-16 DIAGNOSIS — E785 Hyperlipidemia, unspecified: Secondary | ICD-10-CM | POA: Diagnosis not present

## 2021-05-20 DIAGNOSIS — E1165 Type 2 diabetes mellitus with hyperglycemia: Secondary | ICD-10-CM | POA: Diagnosis not present

## 2021-06-07 DIAGNOSIS — M109 Gout, unspecified: Secondary | ICD-10-CM | POA: Diagnosis not present

## 2021-06-07 DIAGNOSIS — S72491D Other fracture of lower end of right femur, subsequent encounter for closed fracture with routine healing: Secondary | ICD-10-CM | POA: Diagnosis not present

## 2021-06-07 DIAGNOSIS — N184 Chronic kidney disease, stage 4 (severe): Secondary | ICD-10-CM | POA: Diagnosis not present

## 2021-06-07 DIAGNOSIS — E1165 Type 2 diabetes mellitus with hyperglycemia: Secondary | ICD-10-CM | POA: Diagnosis not present

## 2021-06-07 DIAGNOSIS — Z794 Long term (current) use of insulin: Secondary | ICD-10-CM | POA: Diagnosis not present

## 2021-06-07 DIAGNOSIS — E1140 Type 2 diabetes mellitus with diabetic neuropathy, unspecified: Secondary | ICD-10-CM | POA: Diagnosis not present

## 2021-06-07 DIAGNOSIS — I129 Hypertensive chronic kidney disease with stage 1 through stage 4 chronic kidney disease, or unspecified chronic kidney disease: Secondary | ICD-10-CM | POA: Diagnosis not present

## 2021-06-07 DIAGNOSIS — E1122 Type 2 diabetes mellitus with diabetic chronic kidney disease: Secondary | ICD-10-CM | POA: Diagnosis not present

## 2021-06-07 DIAGNOSIS — Z9181 History of falling: Secondary | ICD-10-CM | POA: Diagnosis not present

## 2021-07-02 DIAGNOSIS — S72451D Displaced supracondylar fracture without intracondylar extension of lower end of right femur, subsequent encounter for closed fracture with routine healing: Secondary | ICD-10-CM | POA: Diagnosis not present

## 2021-07-25 DIAGNOSIS — N1832 Chronic kidney disease, stage 3b: Secondary | ICD-10-CM | POA: Diagnosis not present

## 2021-07-25 DIAGNOSIS — D649 Anemia, unspecified: Secondary | ICD-10-CM | POA: Diagnosis not present

## 2021-07-25 DIAGNOSIS — I10 Essential (primary) hypertension: Secondary | ICD-10-CM | POA: Diagnosis not present

## 2021-07-25 DIAGNOSIS — Z794 Long term (current) use of insulin: Secondary | ICD-10-CM | POA: Diagnosis not present

## 2021-07-25 DIAGNOSIS — R609 Edema, unspecified: Secondary | ICD-10-CM | POA: Diagnosis not present

## 2021-07-25 DIAGNOSIS — E785 Hyperlipidemia, unspecified: Secondary | ICD-10-CM | POA: Diagnosis not present

## 2021-07-25 DIAGNOSIS — E1165 Type 2 diabetes mellitus with hyperglycemia: Secondary | ICD-10-CM | POA: Diagnosis not present

## 2021-07-30 ENCOUNTER — Other Ambulatory Visit: Payer: Self-pay | Admitting: Family Medicine

## 2021-08-06 DIAGNOSIS — N184 Chronic kidney disease, stage 4 (severe): Secondary | ICD-10-CM | POA: Diagnosis not present

## 2021-08-06 DIAGNOSIS — E1165 Type 2 diabetes mellitus with hyperglycemia: Secondary | ICD-10-CM | POA: Diagnosis not present

## 2021-08-06 DIAGNOSIS — S72491D Other fracture of lower end of right femur, subsequent encounter for closed fracture with routine healing: Secondary | ICD-10-CM | POA: Diagnosis not present

## 2021-08-06 DIAGNOSIS — E1122 Type 2 diabetes mellitus with diabetic chronic kidney disease: Secondary | ICD-10-CM | POA: Diagnosis not present

## 2021-08-06 DIAGNOSIS — M109 Gout, unspecified: Secondary | ICD-10-CM | POA: Diagnosis not present

## 2021-08-06 DIAGNOSIS — Z794 Long term (current) use of insulin: Secondary | ICD-10-CM | POA: Diagnosis not present

## 2021-08-06 DIAGNOSIS — E1140 Type 2 diabetes mellitus with diabetic neuropathy, unspecified: Secondary | ICD-10-CM | POA: Diagnosis not present

## 2021-08-06 DIAGNOSIS — I129 Hypertensive chronic kidney disease with stage 1 through stage 4 chronic kidney disease, or unspecified chronic kidney disease: Secondary | ICD-10-CM | POA: Diagnosis not present

## 2021-08-06 DIAGNOSIS — Z9181 History of falling: Secondary | ICD-10-CM | POA: Diagnosis not present

## 2021-10-01 DIAGNOSIS — S72451D Displaced supracondylar fracture without intracondylar extension of lower end of right femur, subsequent encounter for closed fracture with routine healing: Secondary | ICD-10-CM | POA: Diagnosis not present

## 2021-10-03 ENCOUNTER — Other Ambulatory Visit (HOSPITAL_BASED_OUTPATIENT_CLINIC_OR_DEPARTMENT_OTHER): Payer: Self-pay | Admitting: Student

## 2021-10-03 DIAGNOSIS — S72421D Displaced fracture of lateral condyle of right femur, subsequent encounter for closed fracture with routine healing: Secondary | ICD-10-CM

## 2021-10-10 ENCOUNTER — Ambulatory Visit (HOSPITAL_BASED_OUTPATIENT_CLINIC_OR_DEPARTMENT_OTHER)
Admission: RE | Admit: 2021-10-10 | Discharge: 2021-10-10 | Disposition: A | Discharge status: Home / Self Care | Payer: Medicare PPO | Source: Ambulatory Visit | Attending: Student | Admitting: Student

## 2021-10-10 DIAGNOSIS — S72421D Displaced fracture of lateral condyle of right femur, subsequent encounter for closed fracture with routine healing: Secondary | ICD-10-CM | POA: Insufficient documentation

## 2021-10-10 DIAGNOSIS — M9701XD Periprosthetic fracture around internal prosthetic right hip joint, subsequent encounter: Secondary | ICD-10-CM | POA: Diagnosis not present

## 2021-10-10 DIAGNOSIS — S72431D Displaced fracture of medial condyle of right femur, subsequent encounter for closed fracture with routine healing: Secondary | ICD-10-CM | POA: Diagnosis not present

## 2021-10-14 ENCOUNTER — Other Ambulatory Visit (HOSPITAL_BASED_OUTPATIENT_CLINIC_OR_DEPARTMENT_OTHER): Payer: Self-pay | Admitting: Family Medicine

## 2021-10-14 DIAGNOSIS — E2839 Other primary ovarian failure: Secondary | ICD-10-CM

## 2021-10-21 DIAGNOSIS — S72451K Displaced supracondylar fracture without intracondylar extension of lower end of right femur, subsequent encounter for closed fracture with nonunion: Secondary | ICD-10-CM | POA: Diagnosis not present

## 2021-11-05 DIAGNOSIS — I8312 Varicose veins of left lower extremity with inflammation: Secondary | ICD-10-CM | POA: Diagnosis not present

## 2021-11-05 DIAGNOSIS — L03116 Cellulitis of left lower limb: Secondary | ICD-10-CM | POA: Diagnosis not present

## 2021-11-09 DIAGNOSIS — I872 Venous insufficiency (chronic) (peripheral): Secondary | ICD-10-CM | POA: Diagnosis not present

## 2021-11-11 ENCOUNTER — Other Ambulatory Visit (HOSPITAL_BASED_OUTPATIENT_CLINIC_OR_DEPARTMENT_OTHER): Payer: Medicare PPO

## 2021-11-12 DIAGNOSIS — I872 Venous insufficiency (chronic) (peripheral): Secondary | ICD-10-CM | POA: Diagnosis not present

## 2021-11-12 DIAGNOSIS — L03116 Cellulitis of left lower limb: Secondary | ICD-10-CM | POA: Diagnosis not present

## 2021-11-18 ENCOUNTER — Ambulatory Visit (HOSPITAL_BASED_OUTPATIENT_CLINIC_OR_DEPARTMENT_OTHER)
Admission: RE | Admit: 2021-11-18 | Discharge: 2021-11-18 | Disposition: A | Discharge status: Home / Self Care | Payer: Medicare PPO | Source: Ambulatory Visit | Attending: Family Medicine | Admitting: Family Medicine

## 2021-11-18 DIAGNOSIS — M85831 Other specified disorders of bone density and structure, right forearm: Secondary | ICD-10-CM | POA: Diagnosis not present

## 2021-11-18 DIAGNOSIS — Z780 Asymptomatic menopausal state: Secondary | ICD-10-CM | POA: Diagnosis not present

## 2021-11-18 DIAGNOSIS — E2839 Other primary ovarian failure: Secondary | ICD-10-CM | POA: Insufficient documentation

## 2021-11-21 DIAGNOSIS — I872 Venous insufficiency (chronic) (peripheral): Secondary | ICD-10-CM | POA: Diagnosis not present

## 2021-11-21 DIAGNOSIS — L239 Allergic contact dermatitis, unspecified cause: Secondary | ICD-10-CM | POA: Diagnosis not present

## 2021-12-03 DIAGNOSIS — S72451D Displaced supracondylar fracture without intracondylar extension of lower end of right femur, subsequent encounter for closed fracture with routine healing: Secondary | ICD-10-CM | POA: Diagnosis not present

## 2021-12-06 DIAGNOSIS — L309 Dermatitis, unspecified: Secondary | ICD-10-CM | POA: Diagnosis not present

## 2021-12-06 DIAGNOSIS — I872 Venous insufficiency (chronic) (peripheral): Secondary | ICD-10-CM | POA: Diagnosis not present

## 2021-12-17 DIAGNOSIS — I872 Venous insufficiency (chronic) (peripheral): Secondary | ICD-10-CM | POA: Diagnosis not present

## 2022-01-07 ENCOUNTER — Other Ambulatory Visit: Payer: Self-pay | Admitting: Family Medicine

## 2022-01-07 ENCOUNTER — Ambulatory Visit
Admission: RE | Admit: 2022-01-07 | Discharge: 2022-01-07 | Disposition: A | Discharge status: Home / Self Care | Payer: Medicare PPO | Source: Ambulatory Visit | Attending: Family Medicine | Admitting: Family Medicine

## 2022-01-07 DIAGNOSIS — R609 Edema, unspecified: Secondary | ICD-10-CM | POA: Diagnosis not present

## 2022-01-07 DIAGNOSIS — Z794 Long term (current) use of insulin: Secondary | ICD-10-CM | POA: Diagnosis not present

## 2022-01-07 DIAGNOSIS — E785 Hyperlipidemia, unspecified: Secondary | ICD-10-CM | POA: Diagnosis not present

## 2022-01-07 DIAGNOSIS — M20009 Unspecified deformity of unspecified finger(s): Secondary | ICD-10-CM | POA: Diagnosis not present

## 2022-01-07 DIAGNOSIS — Z89422 Acquired absence of other left toe(s): Secondary | ICD-10-CM | POA: Diagnosis not present

## 2022-01-07 DIAGNOSIS — S61201A Unspecified open wound of left index finger without damage to nail, initial encounter: Secondary | ICD-10-CM | POA: Diagnosis not present

## 2022-01-07 DIAGNOSIS — N1832 Chronic kidney disease, stage 3b: Secondary | ICD-10-CM | POA: Diagnosis not present

## 2022-01-07 DIAGNOSIS — E1165 Type 2 diabetes mellitus with hyperglycemia: Secondary | ICD-10-CM | POA: Diagnosis not present

## 2022-01-07 DIAGNOSIS — D649 Anemia, unspecified: Secondary | ICD-10-CM | POA: Diagnosis not present

## 2022-01-07 DIAGNOSIS — I10 Essential (primary) hypertension: Secondary | ICD-10-CM | POA: Diagnosis not present

## 2022-01-14 DIAGNOSIS — H5212 Myopia, left eye: Secondary | ICD-10-CM | POA: Diagnosis not present

## 2022-01-14 DIAGNOSIS — H43811 Vitreous degeneration, right eye: Secondary | ICD-10-CM | POA: Diagnosis not present

## 2022-01-14 DIAGNOSIS — H524 Presbyopia: Secondary | ICD-10-CM | POA: Diagnosis not present

## 2022-01-14 DIAGNOSIS — E113313 Type 2 diabetes mellitus with moderate nonproliferative diabetic retinopathy with macular edema, bilateral: Secondary | ICD-10-CM | POA: Diagnosis not present

## 2022-01-14 DIAGNOSIS — D3131 Benign neoplasm of right choroid: Secondary | ICD-10-CM | POA: Diagnosis not present

## 2022-01-14 DIAGNOSIS — H52223 Regular astigmatism, bilateral: Secondary | ICD-10-CM | POA: Diagnosis not present

## 2022-01-28 DIAGNOSIS — S72451D Displaced supracondylar fracture without intracondylar extension of lower end of right femur, subsequent encounter for closed fracture with routine healing: Secondary | ICD-10-CM | POA: Diagnosis not present

## 2022-02-07 DIAGNOSIS — M868X4 Other osteomyelitis, hand: Secondary | ICD-10-CM | POA: Diagnosis not present

## 2022-02-10 DIAGNOSIS — I872 Venous insufficiency (chronic) (peripheral): Secondary | ICD-10-CM | POA: Diagnosis not present

## 2022-02-12 DIAGNOSIS — M868X4 Other osteomyelitis, hand: Secondary | ICD-10-CM | POA: Diagnosis not present

## 2022-02-12 DIAGNOSIS — M869 Osteomyelitis, unspecified: Secondary | ICD-10-CM | POA: Diagnosis not present

## 2022-02-18 DIAGNOSIS — M79642 Pain in left hand: Secondary | ICD-10-CM | POA: Diagnosis not present

## 2022-03-03 DIAGNOSIS — E113293 Type 2 diabetes mellitus with mild nonproliferative diabetic retinopathy without macular edema, bilateral: Secondary | ICD-10-CM | POA: Diagnosis not present

## 2022-03-03 DIAGNOSIS — I10 Essential (primary) hypertension: Secondary | ICD-10-CM | POA: Diagnosis not present

## 2022-03-03 DIAGNOSIS — E1140 Type 2 diabetes mellitus with diabetic neuropathy, unspecified: Secondary | ICD-10-CM | POA: Diagnosis not present

## 2022-03-03 DIAGNOSIS — Z0000 Encounter for general adult medical examination without abnormal findings: Secondary | ICD-10-CM | POA: Diagnosis not present

## 2022-03-03 DIAGNOSIS — R609 Edema, unspecified: Secondary | ICD-10-CM | POA: Diagnosis not present

## 2022-03-03 DIAGNOSIS — E785 Hyperlipidemia, unspecified: Secondary | ICD-10-CM | POA: Diagnosis not present

## 2022-03-03 DIAGNOSIS — N1832 Chronic kidney disease, stage 3b: Secondary | ICD-10-CM | POA: Diagnosis not present

## 2022-03-03 DIAGNOSIS — Z23 Encounter for immunization: Secondary | ICD-10-CM | POA: Diagnosis not present

## 2022-03-03 DIAGNOSIS — D649 Anemia, unspecified: Secondary | ICD-10-CM | POA: Diagnosis not present

## 2022-03-11 DIAGNOSIS — S72451D Displaced supracondylar fracture without intracondylar extension of lower end of right femur, subsequent encounter for closed fracture with routine healing: Secondary | ICD-10-CM | POA: Diagnosis not present

## 2022-04-21 DIAGNOSIS — Z0000 Encounter for general adult medical examination without abnormal findings: Secondary | ICD-10-CM | POA: Diagnosis not present

## 2022-04-21 DIAGNOSIS — E785 Hyperlipidemia, unspecified: Secondary | ICD-10-CM | POA: Diagnosis not present

## 2022-04-21 DIAGNOSIS — E0590 Thyrotoxicosis, unspecified without thyrotoxic crisis or storm: Secondary | ICD-10-CM | POA: Diagnosis not present

## 2022-04-21 DIAGNOSIS — E1165 Type 2 diabetes mellitus with hyperglycemia: Secondary | ICD-10-CM | POA: Diagnosis not present

## 2022-04-21 DIAGNOSIS — D649 Anemia, unspecified: Secondary | ICD-10-CM | POA: Diagnosis not present

## 2022-04-21 DIAGNOSIS — N1832 Chronic kidney disease, stage 3b: Secondary | ICD-10-CM | POA: Diagnosis not present

## 2022-05-16 DIAGNOSIS — R059 Cough, unspecified: Secondary | ICD-10-CM | POA: Diagnosis not present

## 2022-05-16 DIAGNOSIS — J988 Other specified respiratory disorders: Secondary | ICD-10-CM | POA: Diagnosis not present

## 2022-07-14 DIAGNOSIS — S72451D Displaced supracondylar fracture without intracondylar extension of lower end of right femur, subsequent encounter for closed fracture with routine healing: Secondary | ICD-10-CM | POA: Diagnosis not present

## 2022-07-15 DIAGNOSIS — N1832 Chronic kidney disease, stage 3b: Secondary | ICD-10-CM | POA: Diagnosis not present

## 2022-07-15 DIAGNOSIS — Z89422 Acquired absence of other left toe(s): Secondary | ICD-10-CM | POA: Diagnosis not present

## 2022-07-15 DIAGNOSIS — E1140 Type 2 diabetes mellitus with diabetic neuropathy, unspecified: Secondary | ICD-10-CM | POA: Diagnosis not present

## 2022-07-15 DIAGNOSIS — E1122 Type 2 diabetes mellitus with diabetic chronic kidney disease: Secondary | ICD-10-CM | POA: Diagnosis not present

## 2022-07-15 DIAGNOSIS — E1165 Type 2 diabetes mellitus with hyperglycemia: Secondary | ICD-10-CM | POA: Diagnosis not present

## 2022-07-15 DIAGNOSIS — L089 Local infection of the skin and subcutaneous tissue, unspecified: Secondary | ICD-10-CM | POA: Diagnosis not present

## 2022-08-04 DIAGNOSIS — N184 Chronic kidney disease, stage 4 (severe): Secondary | ICD-10-CM | POA: Diagnosis not present

## 2022-08-04 DIAGNOSIS — Z89422 Acquired absence of other left toe(s): Secondary | ICD-10-CM | POA: Diagnosis not present

## 2022-08-04 DIAGNOSIS — D649 Anemia, unspecified: Secondary | ICD-10-CM | POA: Diagnosis not present

## 2022-08-04 DIAGNOSIS — E1140 Type 2 diabetes mellitus with diabetic neuropathy, unspecified: Secondary | ICD-10-CM | POA: Diagnosis not present

## 2022-08-04 DIAGNOSIS — E113313 Type 2 diabetes mellitus with moderate nonproliferative diabetic retinopathy with macular edema, bilateral: Secondary | ICD-10-CM | POA: Diagnosis not present

## 2022-08-04 DIAGNOSIS — R2689 Other abnormalities of gait and mobility: Secondary | ICD-10-CM | POA: Diagnosis not present

## 2022-08-04 DIAGNOSIS — E559 Vitamin D deficiency, unspecified: Secondary | ICD-10-CM | POA: Diagnosis not present

## 2022-08-04 DIAGNOSIS — E785 Hyperlipidemia, unspecified: Secondary | ICD-10-CM | POA: Diagnosis not present

## 2022-08-04 DIAGNOSIS — E1122 Type 2 diabetes mellitus with diabetic chronic kidney disease: Secondary | ICD-10-CM | POA: Diagnosis not present

## 2022-08-19 DIAGNOSIS — N184 Chronic kidney disease, stage 4 (severe): Secondary | ICD-10-CM | POA: Diagnosis not present

## 2022-08-19 DIAGNOSIS — E1140 Type 2 diabetes mellitus with diabetic neuropathy, unspecified: Secondary | ICD-10-CM | POA: Diagnosis not present

## 2022-08-19 DIAGNOSIS — Z7984 Long term (current) use of oral hypoglycemic drugs: Secondary | ICD-10-CM | POA: Diagnosis not present

## 2022-08-19 DIAGNOSIS — Z794 Long term (current) use of insulin: Secondary | ICD-10-CM | POA: Diagnosis not present

## 2022-08-19 DIAGNOSIS — E1122 Type 2 diabetes mellitus with diabetic chronic kidney disease: Secondary | ICD-10-CM | POA: Diagnosis not present

## 2022-08-19 DIAGNOSIS — D649 Anemia, unspecified: Secondary | ICD-10-CM | POA: Diagnosis not present

## 2022-08-19 DIAGNOSIS — E1165 Type 2 diabetes mellitus with hyperglycemia: Secondary | ICD-10-CM | POA: Diagnosis not present

## 2022-08-19 DIAGNOSIS — R2689 Other abnormalities of gait and mobility: Secondary | ICD-10-CM | POA: Diagnosis not present

## 2022-08-19 DIAGNOSIS — Z89022 Acquired absence of left finger(s): Secondary | ICD-10-CM | POA: Diagnosis not present

## 2022-08-25 DIAGNOSIS — N184 Chronic kidney disease, stage 4 (severe): Secondary | ICD-10-CM | POA: Diagnosis not present

## 2022-08-25 DIAGNOSIS — E1122 Type 2 diabetes mellitus with diabetic chronic kidney disease: Secondary | ICD-10-CM | POA: Diagnosis not present

## 2022-08-25 DIAGNOSIS — Z89022 Acquired absence of left finger(s): Secondary | ICD-10-CM | POA: Diagnosis not present

## 2022-08-25 DIAGNOSIS — Z794 Long term (current) use of insulin: Secondary | ICD-10-CM | POA: Diagnosis not present

## 2022-08-25 DIAGNOSIS — R2689 Other abnormalities of gait and mobility: Secondary | ICD-10-CM | POA: Diagnosis not present

## 2022-08-25 DIAGNOSIS — D649 Anemia, unspecified: Secondary | ICD-10-CM | POA: Diagnosis not present

## 2022-08-25 DIAGNOSIS — Z7984 Long term (current) use of oral hypoglycemic drugs: Secondary | ICD-10-CM | POA: Diagnosis not present

## 2022-08-25 DIAGNOSIS — E1165 Type 2 diabetes mellitus with hyperglycemia: Secondary | ICD-10-CM | POA: Diagnosis not present

## 2022-08-25 DIAGNOSIS — E1140 Type 2 diabetes mellitus with diabetic neuropathy, unspecified: Secondary | ICD-10-CM | POA: Diagnosis not present

## 2022-08-30 IMAGING — CT CT KNEE*R* W/O CM
3 series · 12 of 35 positions shown, 14 images · non-contrast
Comparison: Radiograph dated March 25, 2021.

CLINICAL DATA: Follow-up for right knee and right femur, plate and
screw fixation of prior periprosthetic fracture of the distal femur

EXAM:
CT OF THE RIGHT KNEE WITHOUT CONTRAST
TECHNIQUE: Multidetector CT imaging of the right knee was performed according
to the standard protocol. Multiplanar CT image reconstructions were
also generated.
RADIATION DOSE REDUCTION: This exam was performed according to the
departmental dose-optimization program which includes automated
exposure control, adjustment of the mA and/or kV according to
patient size and/or use of iterative reconstruction technique.

[Series 5: axial soft (person_name) · axial · 0.42mm/px · z∈[-861,-617]mm · 4 of 178 slices shown, 5 images]
[im 28/178  soft-tissue]
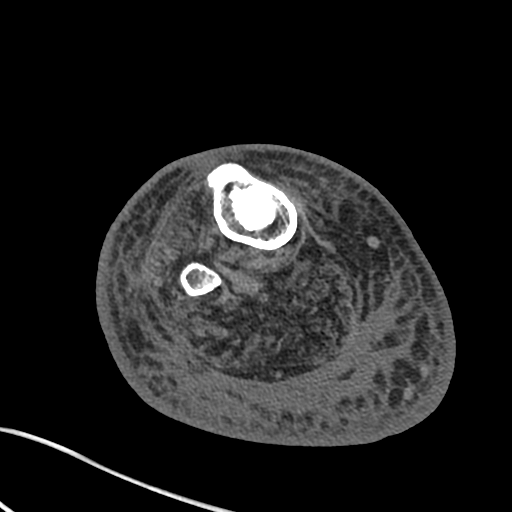
[im 28/178  bone]
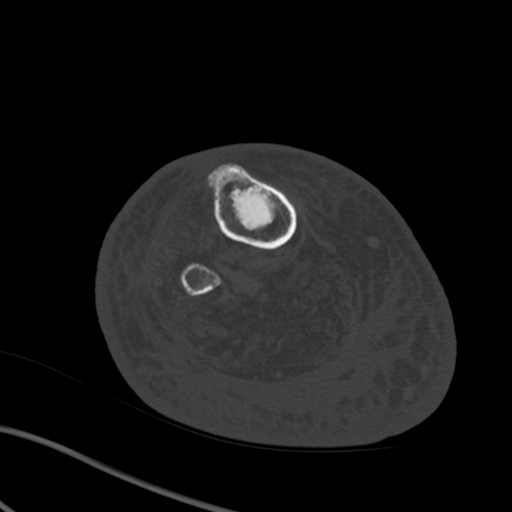
[im 69/178  bone]
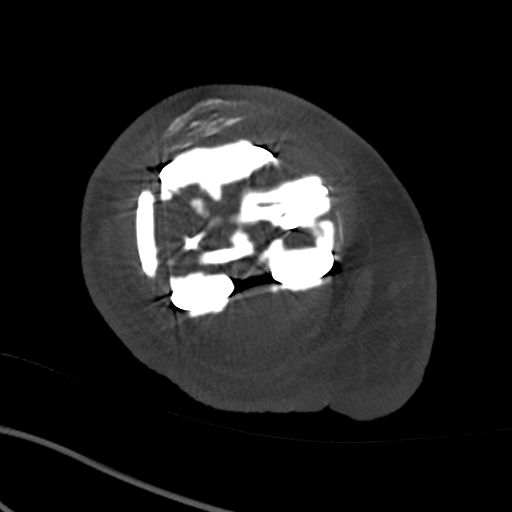
[im 109/178  bone]
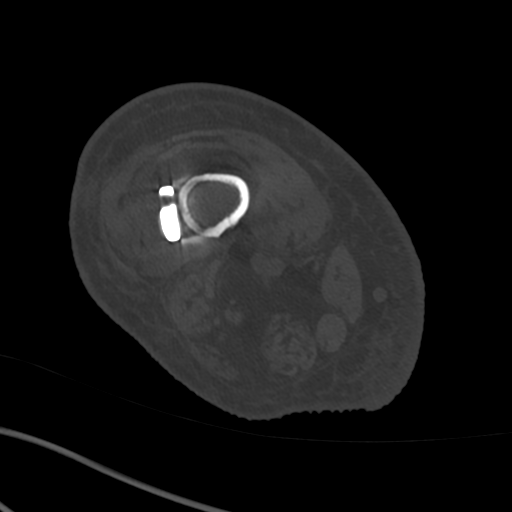
[im 150/178  bone]
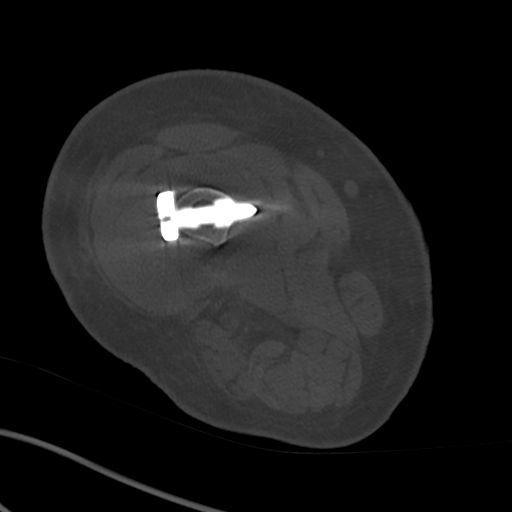

[Series 8: coronal soft (person_name) · coronal · 0.32mm/px · 3 of 111 slices shown]
[im 23/111  bone]
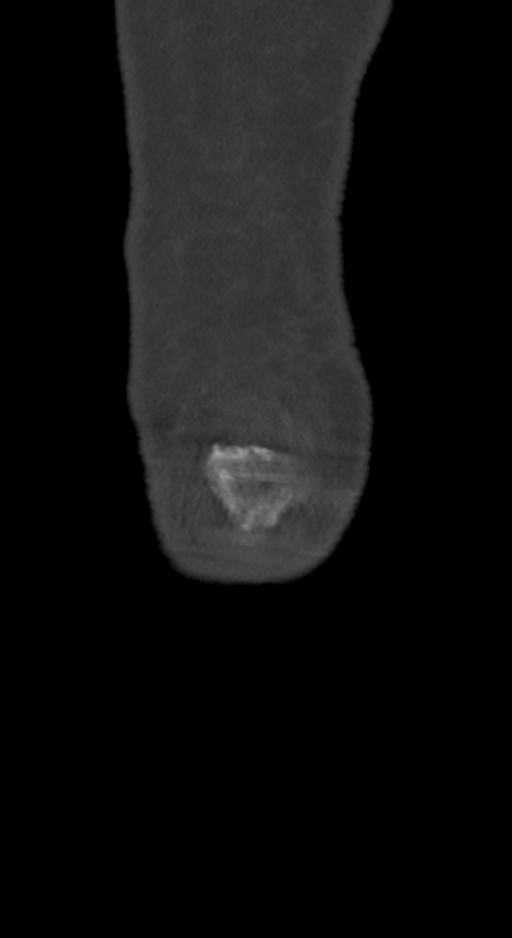
[im 45/111  bone]
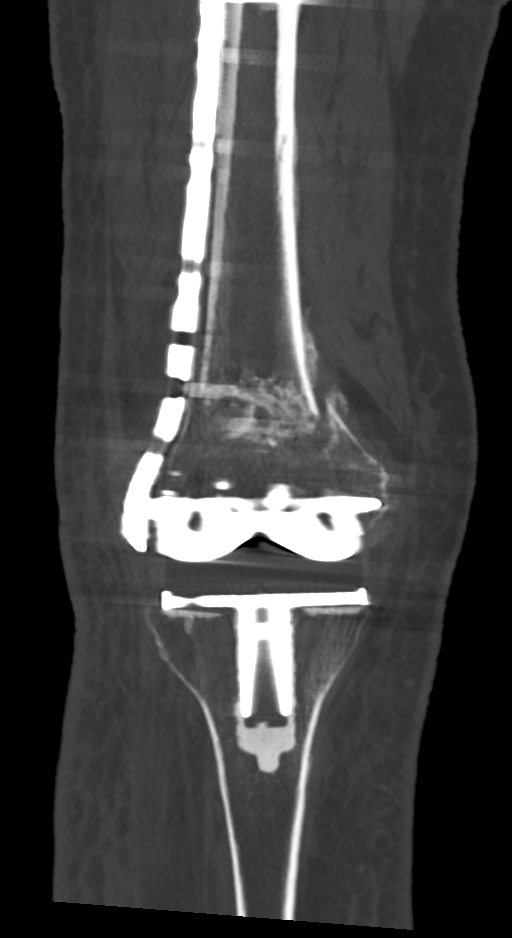
[im 67/111  bone]
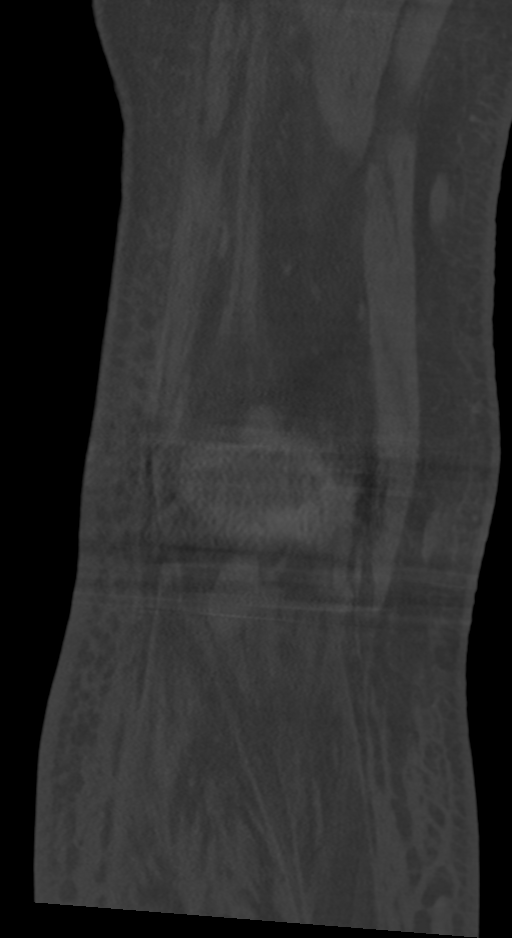

[Series 9: sagittal soft (person_name) · sagittal · 0.43mm/px · 5 of 84 slices shown, 6 images]
[im 28/84  bone]
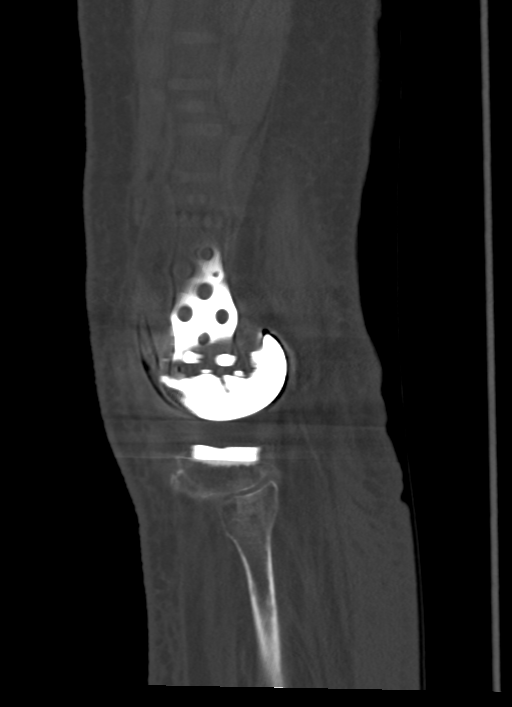
[im 35/84  bone]
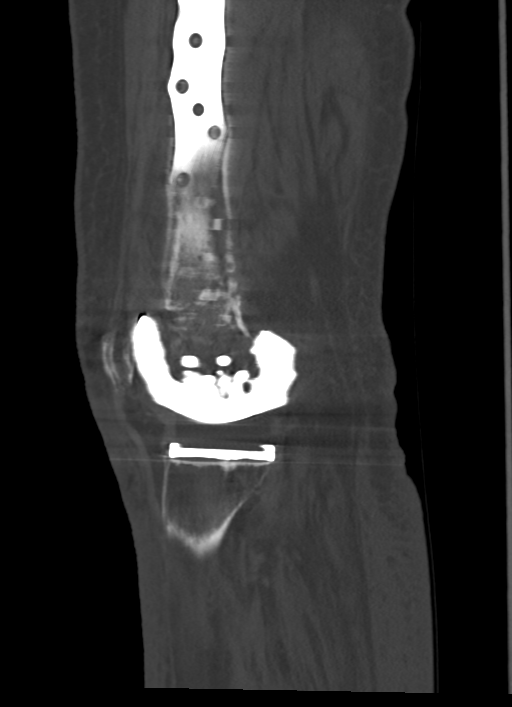
[im 42/84  soft-tissue]
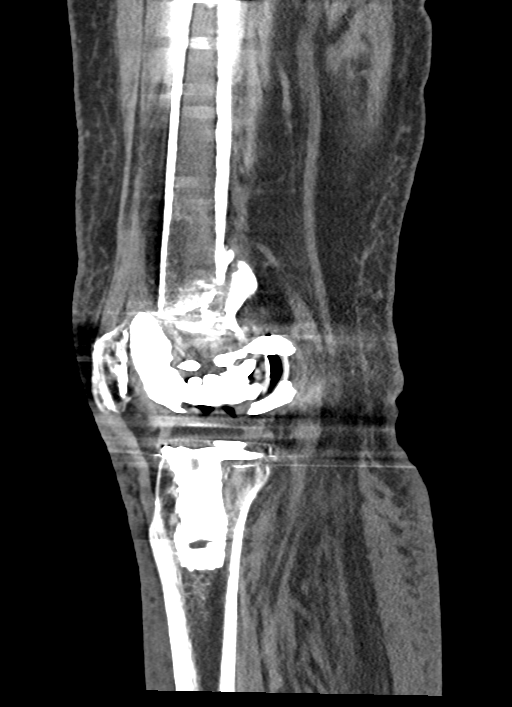
[im 42/84  bone]
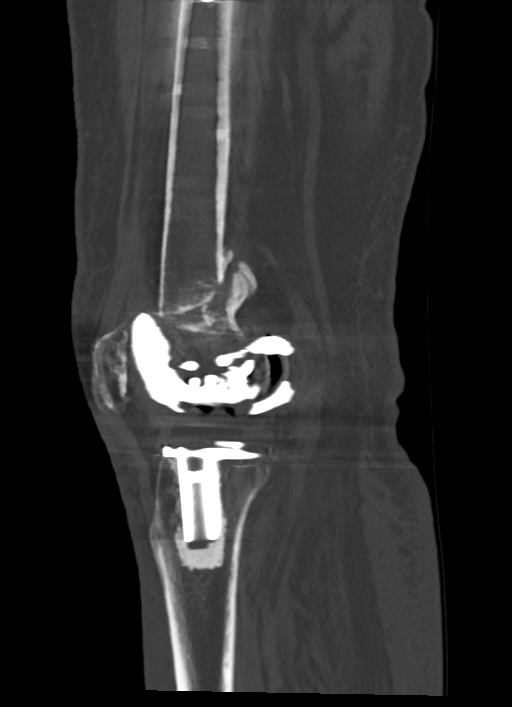
[im 49/84  bone]
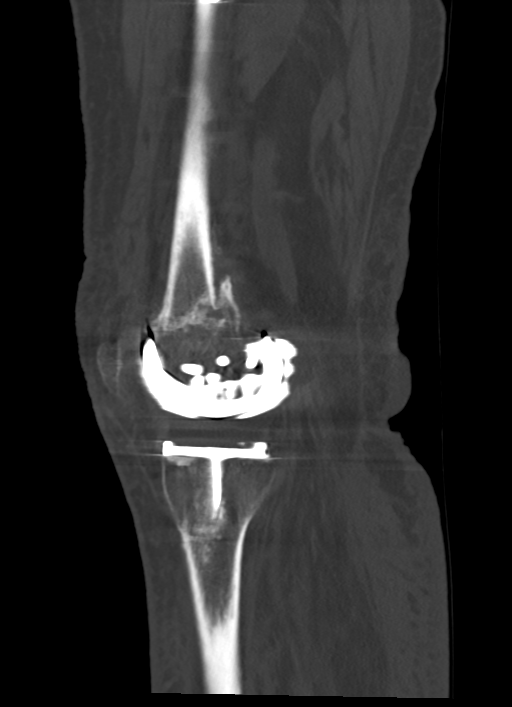
[im 56/84  bone]
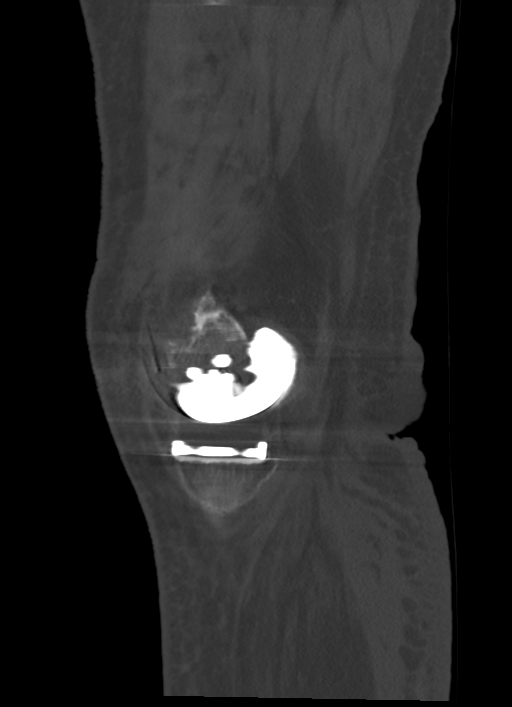

[12 of 35 positions shown; findings below may reference images not displayed]

FINDINGS: Bones/Joint/Cartilage

Status post right knee arthroplasty. There is plate and screw
fixation of the periprosthetic fracture. There is no loosening about
the proximal and distal femoral screws.

There is partial osseous bridging between the fracture fragments of
the distal femur with persistent visible fracture line.

Ligaments

Suboptimally assessed by CT.

Muscles and Tendons

Generalized muscle atrophy. No intramuscular fluid collection or
hematoma.

Soft tissues

Marked generalized subcutaneous soft tissue edema about the ankle.
No drainable fluid collection or hematoma.
IMPRESSION: 1. Status post plate and screw fixation of the distal femoral
periprosthetic fracture. There is partial osseous bridging with
persistent fracture line representing partial ongoing healing.

2. The hardware is intact. No loosening about the proximal or distal
femoral screws or about the knee arthroplasty.

3. Marked subcutaneous soft tissue edema about the knee. Generalized
muscle atrophy. No fluid collection or abscess.

## 2022-09-01 DIAGNOSIS — E1165 Type 2 diabetes mellitus with hyperglycemia: Secondary | ICD-10-CM | POA: Diagnosis not present

## 2022-09-01 DIAGNOSIS — E1122 Type 2 diabetes mellitus with diabetic chronic kidney disease: Secondary | ICD-10-CM | POA: Diagnosis not present

## 2022-09-01 DIAGNOSIS — E1140 Type 2 diabetes mellitus with diabetic neuropathy, unspecified: Secondary | ICD-10-CM | POA: Diagnosis not present

## 2022-09-01 DIAGNOSIS — Z7984 Long term (current) use of oral hypoglycemic drugs: Secondary | ICD-10-CM | POA: Diagnosis not present

## 2022-09-01 DIAGNOSIS — D649 Anemia, unspecified: Secondary | ICD-10-CM | POA: Diagnosis not present

## 2022-09-01 DIAGNOSIS — Z794 Long term (current) use of insulin: Secondary | ICD-10-CM | POA: Diagnosis not present

## 2022-09-01 DIAGNOSIS — N184 Chronic kidney disease, stage 4 (severe): Secondary | ICD-10-CM | POA: Diagnosis not present

## 2022-09-01 DIAGNOSIS — R2689 Other abnormalities of gait and mobility: Secondary | ICD-10-CM | POA: Diagnosis not present

## 2022-09-01 DIAGNOSIS — Z89022 Acquired absence of left finger(s): Secondary | ICD-10-CM | POA: Diagnosis not present

## 2022-09-08 DIAGNOSIS — E1165 Type 2 diabetes mellitus with hyperglycemia: Secondary | ICD-10-CM | POA: Diagnosis not present

## 2022-09-08 DIAGNOSIS — R2689 Other abnormalities of gait and mobility: Secondary | ICD-10-CM | POA: Diagnosis not present

## 2022-09-08 DIAGNOSIS — D649 Anemia, unspecified: Secondary | ICD-10-CM | POA: Diagnosis not present

## 2022-09-08 DIAGNOSIS — E1122 Type 2 diabetes mellitus with diabetic chronic kidney disease: Secondary | ICD-10-CM | POA: Diagnosis not present

## 2022-09-08 DIAGNOSIS — E1140 Type 2 diabetes mellitus with diabetic neuropathy, unspecified: Secondary | ICD-10-CM | POA: Diagnosis not present

## 2022-09-08 DIAGNOSIS — Z89022 Acquired absence of left finger(s): Secondary | ICD-10-CM | POA: Diagnosis not present

## 2022-09-08 DIAGNOSIS — Z794 Long term (current) use of insulin: Secondary | ICD-10-CM | POA: Diagnosis not present

## 2022-09-08 DIAGNOSIS — N184 Chronic kidney disease, stage 4 (severe): Secondary | ICD-10-CM | POA: Diagnosis not present

## 2022-09-08 DIAGNOSIS — Z7984 Long term (current) use of oral hypoglycemic drugs: Secondary | ICD-10-CM | POA: Diagnosis not present

## 2022-09-15 DIAGNOSIS — Z794 Long term (current) use of insulin: Secondary | ICD-10-CM | POA: Diagnosis not present

## 2022-09-15 DIAGNOSIS — R2689 Other abnormalities of gait and mobility: Secondary | ICD-10-CM | POA: Diagnosis not present

## 2022-09-15 DIAGNOSIS — Z89022 Acquired absence of left finger(s): Secondary | ICD-10-CM | POA: Diagnosis not present

## 2022-09-15 DIAGNOSIS — Z7984 Long term (current) use of oral hypoglycemic drugs: Secondary | ICD-10-CM | POA: Diagnosis not present

## 2022-09-15 DIAGNOSIS — N184 Chronic kidney disease, stage 4 (severe): Secondary | ICD-10-CM | POA: Diagnosis not present

## 2022-09-15 DIAGNOSIS — D649 Anemia, unspecified: Secondary | ICD-10-CM | POA: Diagnosis not present

## 2022-09-15 DIAGNOSIS — E1165 Type 2 diabetes mellitus with hyperglycemia: Secondary | ICD-10-CM | POA: Diagnosis not present

## 2022-09-15 DIAGNOSIS — E1122 Type 2 diabetes mellitus with diabetic chronic kidney disease: Secondary | ICD-10-CM | POA: Diagnosis not present

## 2022-09-15 DIAGNOSIS — E1140 Type 2 diabetes mellitus with diabetic neuropathy, unspecified: Secondary | ICD-10-CM | POA: Diagnosis not present

## 2022-09-18 DIAGNOSIS — L03011 Cellulitis of right finger: Secondary | ICD-10-CM | POA: Diagnosis not present

## 2022-09-18 DIAGNOSIS — L03012 Cellulitis of left finger: Secondary | ICD-10-CM | POA: Diagnosis not present

## 2022-09-22 DIAGNOSIS — E1140 Type 2 diabetes mellitus with diabetic neuropathy, unspecified: Secondary | ICD-10-CM | POA: Diagnosis not present

## 2022-09-22 DIAGNOSIS — Z794 Long term (current) use of insulin: Secondary | ICD-10-CM | POA: Diagnosis not present

## 2022-09-22 DIAGNOSIS — E1165 Type 2 diabetes mellitus with hyperglycemia: Secondary | ICD-10-CM | POA: Diagnosis not present

## 2022-09-22 DIAGNOSIS — Z89022 Acquired absence of left finger(s): Secondary | ICD-10-CM | POA: Diagnosis not present

## 2022-09-22 DIAGNOSIS — R2689 Other abnormalities of gait and mobility: Secondary | ICD-10-CM | POA: Diagnosis not present

## 2022-09-22 DIAGNOSIS — D649 Anemia, unspecified: Secondary | ICD-10-CM | POA: Diagnosis not present

## 2022-09-22 DIAGNOSIS — N184 Chronic kidney disease, stage 4 (severe): Secondary | ICD-10-CM | POA: Diagnosis not present

## 2022-09-22 DIAGNOSIS — E1122 Type 2 diabetes mellitus with diabetic chronic kidney disease: Secondary | ICD-10-CM | POA: Diagnosis not present

## 2022-09-22 DIAGNOSIS — Z7984 Long term (current) use of oral hypoglycemic drugs: Secondary | ICD-10-CM | POA: Diagnosis not present

## 2022-09-29 DIAGNOSIS — R2689 Other abnormalities of gait and mobility: Secondary | ICD-10-CM | POA: Diagnosis not present

## 2022-09-29 DIAGNOSIS — E1140 Type 2 diabetes mellitus with diabetic neuropathy, unspecified: Secondary | ICD-10-CM | POA: Diagnosis not present

## 2022-09-29 DIAGNOSIS — N184 Chronic kidney disease, stage 4 (severe): Secondary | ICD-10-CM | POA: Diagnosis not present

## 2022-09-29 DIAGNOSIS — E1165 Type 2 diabetes mellitus with hyperglycemia: Secondary | ICD-10-CM | POA: Diagnosis not present

## 2022-09-29 DIAGNOSIS — Z89022 Acquired absence of left finger(s): Secondary | ICD-10-CM | POA: Diagnosis not present

## 2022-09-29 DIAGNOSIS — Z7984 Long term (current) use of oral hypoglycemic drugs: Secondary | ICD-10-CM | POA: Diagnosis not present

## 2022-09-29 DIAGNOSIS — E1122 Type 2 diabetes mellitus with diabetic chronic kidney disease: Secondary | ICD-10-CM | POA: Diagnosis not present

## 2022-09-29 DIAGNOSIS — Z794 Long term (current) use of insulin: Secondary | ICD-10-CM | POA: Diagnosis not present

## 2022-09-29 DIAGNOSIS — D649 Anemia, unspecified: Secondary | ICD-10-CM | POA: Diagnosis not present

## 2022-10-10 DIAGNOSIS — D649 Anemia, unspecified: Secondary | ICD-10-CM | POA: Diagnosis not present

## 2022-10-10 DIAGNOSIS — E1165 Type 2 diabetes mellitus with hyperglycemia: Secondary | ICD-10-CM | POA: Diagnosis not present

## 2022-10-10 DIAGNOSIS — R2689 Other abnormalities of gait and mobility: Secondary | ICD-10-CM | POA: Diagnosis not present

## 2022-10-10 DIAGNOSIS — Z794 Long term (current) use of insulin: Secondary | ICD-10-CM | POA: Diagnosis not present

## 2022-10-10 DIAGNOSIS — E1122 Type 2 diabetes mellitus with diabetic chronic kidney disease: Secondary | ICD-10-CM | POA: Diagnosis not present

## 2022-10-10 DIAGNOSIS — N184 Chronic kidney disease, stage 4 (severe): Secondary | ICD-10-CM | POA: Diagnosis not present

## 2022-10-10 DIAGNOSIS — E1140 Type 2 diabetes mellitus with diabetic neuropathy, unspecified: Secondary | ICD-10-CM | POA: Diagnosis not present

## 2022-10-10 DIAGNOSIS — Z7984 Long term (current) use of oral hypoglycemic drugs: Secondary | ICD-10-CM | POA: Diagnosis not present

## 2022-10-10 DIAGNOSIS — Z89022 Acquired absence of left finger(s): Secondary | ICD-10-CM | POA: Diagnosis not present

## 2022-10-13 DIAGNOSIS — E1165 Type 2 diabetes mellitus with hyperglycemia: Secondary | ICD-10-CM | POA: Diagnosis not present

## 2022-10-13 DIAGNOSIS — E1140 Type 2 diabetes mellitus with diabetic neuropathy, unspecified: Secondary | ICD-10-CM | POA: Diagnosis not present

## 2022-10-13 DIAGNOSIS — E1122 Type 2 diabetes mellitus with diabetic chronic kidney disease: Secondary | ICD-10-CM | POA: Diagnosis not present

## 2022-10-13 DIAGNOSIS — R2689 Other abnormalities of gait and mobility: Secondary | ICD-10-CM | POA: Diagnosis not present

## 2022-10-13 DIAGNOSIS — Z89022 Acquired absence of left finger(s): Secondary | ICD-10-CM | POA: Diagnosis not present

## 2022-10-13 DIAGNOSIS — N184 Chronic kidney disease, stage 4 (severe): Secondary | ICD-10-CM | POA: Diagnosis not present

## 2022-10-13 DIAGNOSIS — Z794 Long term (current) use of insulin: Secondary | ICD-10-CM | POA: Diagnosis not present

## 2022-10-13 DIAGNOSIS — Z7984 Long term (current) use of oral hypoglycemic drugs: Secondary | ICD-10-CM | POA: Diagnosis not present

## 2022-10-13 DIAGNOSIS — D649 Anemia, unspecified: Secondary | ICD-10-CM | POA: Diagnosis not present

## 2022-10-18 DIAGNOSIS — E1165 Type 2 diabetes mellitus with hyperglycemia: Secondary | ICD-10-CM | POA: Diagnosis not present

## 2022-10-18 DIAGNOSIS — E1122 Type 2 diabetes mellitus with diabetic chronic kidney disease: Secondary | ICD-10-CM | POA: Diagnosis not present

## 2022-10-18 DIAGNOSIS — N184 Chronic kidney disease, stage 4 (severe): Secondary | ICD-10-CM | POA: Diagnosis not present

## 2022-10-18 DIAGNOSIS — Z7984 Long term (current) use of oral hypoglycemic drugs: Secondary | ICD-10-CM | POA: Diagnosis not present

## 2022-10-18 DIAGNOSIS — Z794 Long term (current) use of insulin: Secondary | ICD-10-CM | POA: Diagnosis not present

## 2022-10-18 DIAGNOSIS — R2689 Other abnormalities of gait and mobility: Secondary | ICD-10-CM | POA: Diagnosis not present

## 2022-10-18 DIAGNOSIS — D649 Anemia, unspecified: Secondary | ICD-10-CM | POA: Diagnosis not present

## 2022-10-18 DIAGNOSIS — E1140 Type 2 diabetes mellitus with diabetic neuropathy, unspecified: Secondary | ICD-10-CM | POA: Diagnosis not present

## 2022-10-18 DIAGNOSIS — Z89022 Acquired absence of left finger(s): Secondary | ICD-10-CM | POA: Diagnosis not present

## 2022-10-20 DIAGNOSIS — Z89022 Acquired absence of left finger(s): Secondary | ICD-10-CM | POA: Diagnosis not present

## 2022-10-20 DIAGNOSIS — R2689 Other abnormalities of gait and mobility: Secondary | ICD-10-CM | POA: Diagnosis not present

## 2022-10-20 DIAGNOSIS — Z794 Long term (current) use of insulin: Secondary | ICD-10-CM | POA: Diagnosis not present

## 2022-10-20 DIAGNOSIS — E1140 Type 2 diabetes mellitus with diabetic neuropathy, unspecified: Secondary | ICD-10-CM | POA: Diagnosis not present

## 2022-10-20 DIAGNOSIS — D649 Anemia, unspecified: Secondary | ICD-10-CM | POA: Diagnosis not present

## 2022-10-20 DIAGNOSIS — E1165 Type 2 diabetes mellitus with hyperglycemia: Secondary | ICD-10-CM | POA: Diagnosis not present

## 2022-10-20 DIAGNOSIS — Z7984 Long term (current) use of oral hypoglycemic drugs: Secondary | ICD-10-CM | POA: Diagnosis not present

## 2022-10-20 DIAGNOSIS — E1122 Type 2 diabetes mellitus with diabetic chronic kidney disease: Secondary | ICD-10-CM | POA: Diagnosis not present

## 2022-10-20 DIAGNOSIS — N184 Chronic kidney disease, stage 4 (severe): Secondary | ICD-10-CM | POA: Diagnosis not present

## 2022-10-28 DIAGNOSIS — E1140 Type 2 diabetes mellitus with diabetic neuropathy, unspecified: Secondary | ICD-10-CM | POA: Diagnosis not present

## 2022-10-28 DIAGNOSIS — J988 Other specified respiratory disorders: Secondary | ICD-10-CM | POA: Diagnosis not present

## 2022-10-28 DIAGNOSIS — J019 Acute sinusitis, unspecified: Secondary | ICD-10-CM | POA: Diagnosis not present

## 2022-10-28 DIAGNOSIS — E1122 Type 2 diabetes mellitus with diabetic chronic kidney disease: Secondary | ICD-10-CM | POA: Diagnosis not present

## 2022-10-28 DIAGNOSIS — N184 Chronic kidney disease, stage 4 (severe): Secondary | ICD-10-CM | POA: Diagnosis not present

## 2022-10-28 DIAGNOSIS — Z89412 Acquired absence of left great toe: Secondary | ICD-10-CM | POA: Diagnosis not present

## 2022-11-03 DIAGNOSIS — Z89022 Acquired absence of left finger(s): Secondary | ICD-10-CM | POA: Diagnosis not present

## 2022-11-03 DIAGNOSIS — R2689 Other abnormalities of gait and mobility: Secondary | ICD-10-CM | POA: Diagnosis not present

## 2022-11-03 DIAGNOSIS — D649 Anemia, unspecified: Secondary | ICD-10-CM | POA: Diagnosis not present

## 2022-11-03 DIAGNOSIS — E1122 Type 2 diabetes mellitus with diabetic chronic kidney disease: Secondary | ICD-10-CM | POA: Diagnosis not present

## 2022-11-03 DIAGNOSIS — E1165 Type 2 diabetes mellitus with hyperglycemia: Secondary | ICD-10-CM | POA: Diagnosis not present

## 2022-11-03 DIAGNOSIS — Z794 Long term (current) use of insulin: Secondary | ICD-10-CM | POA: Diagnosis not present

## 2022-11-03 DIAGNOSIS — Z7984 Long term (current) use of oral hypoglycemic drugs: Secondary | ICD-10-CM | POA: Diagnosis not present

## 2022-11-03 DIAGNOSIS — N184 Chronic kidney disease, stage 4 (severe): Secondary | ICD-10-CM | POA: Diagnosis not present

## 2022-11-03 DIAGNOSIS — E1140 Type 2 diabetes mellitus with diabetic neuropathy, unspecified: Secondary | ICD-10-CM | POA: Diagnosis not present

## 2022-11-10 DIAGNOSIS — N184 Chronic kidney disease, stage 4 (severe): Secondary | ICD-10-CM | POA: Diagnosis not present

## 2022-11-10 DIAGNOSIS — E1140 Type 2 diabetes mellitus with diabetic neuropathy, unspecified: Secondary | ICD-10-CM | POA: Diagnosis not present

## 2022-11-10 DIAGNOSIS — D649 Anemia, unspecified: Secondary | ICD-10-CM | POA: Diagnosis not present

## 2022-11-10 DIAGNOSIS — Z89022 Acquired absence of left finger(s): Secondary | ICD-10-CM | POA: Diagnosis not present

## 2022-11-10 DIAGNOSIS — R2689 Other abnormalities of gait and mobility: Secondary | ICD-10-CM | POA: Diagnosis not present

## 2022-11-10 DIAGNOSIS — E1165 Type 2 diabetes mellitus with hyperglycemia: Secondary | ICD-10-CM | POA: Diagnosis not present

## 2022-11-10 DIAGNOSIS — Z7984 Long term (current) use of oral hypoglycemic drugs: Secondary | ICD-10-CM | POA: Diagnosis not present

## 2022-11-10 DIAGNOSIS — Z794 Long term (current) use of insulin: Secondary | ICD-10-CM | POA: Diagnosis not present

## 2022-11-10 DIAGNOSIS — E1122 Type 2 diabetes mellitus with diabetic chronic kidney disease: Secondary | ICD-10-CM | POA: Diagnosis not present

## 2022-11-18 DIAGNOSIS — D649 Anemia, unspecified: Secondary | ICD-10-CM | POA: Diagnosis not present

## 2022-11-18 DIAGNOSIS — N184 Chronic kidney disease, stage 4 (severe): Secondary | ICD-10-CM | POA: Diagnosis not present

## 2022-11-18 DIAGNOSIS — E1165 Type 2 diabetes mellitus with hyperglycemia: Secondary | ICD-10-CM | POA: Diagnosis not present

## 2022-11-18 DIAGNOSIS — Z89022 Acquired absence of left finger(s): Secondary | ICD-10-CM | POA: Diagnosis not present

## 2022-11-18 DIAGNOSIS — R2689 Other abnormalities of gait and mobility: Secondary | ICD-10-CM | POA: Diagnosis not present

## 2022-11-18 DIAGNOSIS — Z794 Long term (current) use of insulin: Secondary | ICD-10-CM | POA: Diagnosis not present

## 2022-11-18 DIAGNOSIS — E1122 Type 2 diabetes mellitus with diabetic chronic kidney disease: Secondary | ICD-10-CM | POA: Diagnosis not present

## 2022-11-18 DIAGNOSIS — Z7984 Long term (current) use of oral hypoglycemic drugs: Secondary | ICD-10-CM | POA: Diagnosis not present

## 2022-11-18 DIAGNOSIS — E1140 Type 2 diabetes mellitus with diabetic neuropathy, unspecified: Secondary | ICD-10-CM | POA: Diagnosis not present

## 2022-11-24 DIAGNOSIS — Z794 Long term (current) use of insulin: Secondary | ICD-10-CM | POA: Diagnosis not present

## 2022-11-24 DIAGNOSIS — R2689 Other abnormalities of gait and mobility: Secondary | ICD-10-CM | POA: Diagnosis not present

## 2022-11-24 DIAGNOSIS — Z89022 Acquired absence of left finger(s): Secondary | ICD-10-CM | POA: Diagnosis not present

## 2022-11-24 DIAGNOSIS — E1122 Type 2 diabetes mellitus with diabetic chronic kidney disease: Secondary | ICD-10-CM | POA: Diagnosis not present

## 2022-11-24 DIAGNOSIS — E1140 Type 2 diabetes mellitus with diabetic neuropathy, unspecified: Secondary | ICD-10-CM | POA: Diagnosis not present

## 2022-11-24 DIAGNOSIS — E1165 Type 2 diabetes mellitus with hyperglycemia: Secondary | ICD-10-CM | POA: Diagnosis not present

## 2022-11-24 DIAGNOSIS — Z7984 Long term (current) use of oral hypoglycemic drugs: Secondary | ICD-10-CM | POA: Diagnosis not present

## 2022-11-24 DIAGNOSIS — D649 Anemia, unspecified: Secondary | ICD-10-CM | POA: Diagnosis not present

## 2022-11-24 DIAGNOSIS — N184 Chronic kidney disease, stage 4 (severe): Secondary | ICD-10-CM | POA: Diagnosis not present

## 2022-11-27 DIAGNOSIS — Z96651 Presence of right artificial knee joint: Secondary | ICD-10-CM | POA: Diagnosis not present

## 2022-11-27 DIAGNOSIS — M25561 Pain in right knee: Secondary | ICD-10-CM | POA: Diagnosis not present

## 2022-11-27 DIAGNOSIS — M1712 Unilateral primary osteoarthritis, left knee: Secondary | ICD-10-CM | POA: Diagnosis not present

## 2022-12-05 DIAGNOSIS — M1812 Unilateral primary osteoarthritis of first carpometacarpal joint, left hand: Secondary | ICD-10-CM | POA: Diagnosis not present

## 2022-12-05 DIAGNOSIS — L98492 Non-pressure chronic ulcer of skin of other sites with fat layer exposed: Secondary | ICD-10-CM | POA: Diagnosis not present

## 2022-12-05 DIAGNOSIS — Z882 Allergy status to sulfonamides status: Secondary | ICD-10-CM | POA: Diagnosis not present

## 2022-12-05 DIAGNOSIS — G629 Polyneuropathy, unspecified: Secondary | ICD-10-CM | POA: Diagnosis not present

## 2022-12-08 DIAGNOSIS — E1140 Type 2 diabetes mellitus with diabetic neuropathy, unspecified: Secondary | ICD-10-CM | POA: Diagnosis not present

## 2022-12-08 DIAGNOSIS — R2689 Other abnormalities of gait and mobility: Secondary | ICD-10-CM | POA: Diagnosis not present

## 2022-12-08 DIAGNOSIS — D649 Anemia, unspecified: Secondary | ICD-10-CM | POA: Diagnosis not present

## 2022-12-08 DIAGNOSIS — Z794 Long term (current) use of insulin: Secondary | ICD-10-CM | POA: Diagnosis not present

## 2022-12-08 DIAGNOSIS — Z7984 Long term (current) use of oral hypoglycemic drugs: Secondary | ICD-10-CM | POA: Diagnosis not present

## 2022-12-08 DIAGNOSIS — E1165 Type 2 diabetes mellitus with hyperglycemia: Secondary | ICD-10-CM | POA: Diagnosis not present

## 2022-12-08 DIAGNOSIS — E1122 Type 2 diabetes mellitus with diabetic chronic kidney disease: Secondary | ICD-10-CM | POA: Diagnosis not present

## 2022-12-08 DIAGNOSIS — N184 Chronic kidney disease, stage 4 (severe): Secondary | ICD-10-CM | POA: Diagnosis not present

## 2022-12-08 DIAGNOSIS — Z89022 Acquired absence of left finger(s): Secondary | ICD-10-CM | POA: Diagnosis not present

## 2022-12-09 DIAGNOSIS — R0602 Shortness of breath: Secondary | ICD-10-CM | POA: Diagnosis not present

## 2022-12-09 DIAGNOSIS — E1122 Type 2 diabetes mellitus with diabetic chronic kidney disease: Secondary | ICD-10-CM | POA: Diagnosis not present

## 2022-12-09 DIAGNOSIS — R609 Edema, unspecified: Secondary | ICD-10-CM | POA: Diagnosis not present

## 2022-12-09 DIAGNOSIS — E1121 Type 2 diabetes mellitus with diabetic nephropathy: Secondary | ICD-10-CM | POA: Diagnosis not present

## 2022-12-09 DIAGNOSIS — J988 Other specified respiratory disorders: Secondary | ICD-10-CM | POA: Diagnosis not present

## 2022-12-09 DIAGNOSIS — R059 Cough, unspecified: Secondary | ICD-10-CM | POA: Diagnosis not present

## 2022-12-09 DIAGNOSIS — N289 Disorder of kidney and ureter, unspecified: Secondary | ICD-10-CM | POA: Diagnosis not present

## 2022-12-16 ENCOUNTER — Encounter: Payer: Self-pay | Admitting: Cardiology

## 2022-12-16 ENCOUNTER — Ambulatory Visit: Payer: Medicare PPO | Admitting: Cardiology

## 2022-12-16 VITALS — BP 146/58 | HR 72 | Resp 16 | Ht 60.0 in

## 2022-12-16 DIAGNOSIS — H43813 Vitreous degeneration, bilateral: Secondary | ICD-10-CM | POA: Diagnosis not present

## 2022-12-16 DIAGNOSIS — Z961 Presence of intraocular lens: Secondary | ICD-10-CM | POA: Diagnosis not present

## 2022-12-16 DIAGNOSIS — N184 Chronic kidney disease, stage 4 (severe): Secondary | ICD-10-CM | POA: Insufficient documentation

## 2022-12-16 DIAGNOSIS — Z794 Long term (current) use of insulin: Secondary | ICD-10-CM | POA: Diagnosis not present

## 2022-12-16 DIAGNOSIS — I129 Hypertensive chronic kidney disease with stage 1 through stage 4 chronic kidney disease, or unspecified chronic kidney disease: Secondary | ICD-10-CM | POA: Diagnosis not present

## 2022-12-16 DIAGNOSIS — R0609 Other forms of dyspnea: Secondary | ICD-10-CM | POA: Diagnosis not present

## 2022-12-16 DIAGNOSIS — E113493 Type 2 diabetes mellitus with severe nonproliferative diabetic retinopathy without macular edema, bilateral: Secondary | ICD-10-CM | POA: Diagnosis not present

## 2022-12-16 MED ORDER — FUROSEMIDE 40 MG PO TABS: 40.0000 mg | ORAL_TABLET | Freq: Two times a day (BID) | ORAL | 3 refills | Status: DC

## 2022-12-16 NOTE — Progress Notes (Signed)
Patient referred by Farris Has, MD for exertional dyspnea  Subjective:   Angela Castro, female    DOB: Nov 16, 1940, 82 y.o.   MRN: [id number]   Chief Complaint  Patient presents with   Shortness of Breath   New Patient (Initial Visit)    HPI  82 y.o. Caucasian female with hypertension, hyperlipidemia, type 2 diabetes mellitus, CKD stage IV, referred for evaluation of exertional dyspnea  Patient is here with her husband.  They recently moved from [place] to [place] area.  For last several months, patient has had progressively worsening leg edema and exertional dyspnea.  She denies any chest pain, palpitations, presyncope or syncope.  She was recently found to have creatinine of 2.47, which reportedly has been "up-and-down".  She is awaiting an appointment with nephrology.  Blood pressure slightly elevated today.  She is currently on no diuretics.   Past Medical History:  Diagnosis Date   Asthma    Diabetes mellitus without complication (HCC)    Hypertension      Past Surgical History:  Procedure Laterality Date   AMPUTATION Left 08/26/2016   Procedure: AMPUTATION RAY/1ST;  Surgeon: Toni Arthurs, MD;  [facility];  Service: Orthopedics;  Laterality: Left;   IR GENERIC HISTORICAL  07/30/2016   IR US GUIDE VASC ACCESS RIGHT 07/30/2016 Richarda Overlie, MD [facility]-INTERV RAD   IR GENERIC HISTORICAL  07/30/2016   IR FLUORO GUIDE CV MIDLINE PICC RIGHT 07/30/2016 Richarda Overlie, MD [facility]   KNEE ARTHROSCOPY     x 4   ORIF FEMUR FRACTURE Right 03/25/2021   Procedure: OPEN REDUCTION INTERNAL FIXATION (ORIF) DISTAL FEMUR FRACTURE;  Surgeon: Roby Lofts, MD;  Location: [facility] OR;  Service: Orthopedics;  Laterality: Right;   REPLACEMENT TOTAL KNEE      Social History   Tobacco Use  Smoking Status Never  Smokeless Tobacco Never    Social History   Substance and Sexual Activity  Alcohol Use No     No family history on file.    Current Outpatient Medications:     allopurinol (ZYLOPRIM) 100 MG tablet, Take 100 mg by mouth 2 (two) times daily. , Disp: , Rfl:    amLODipine (NORVASC) 5 MG tablet, Take 5 mg by mouth every morning., Disp: , Rfl:    aspirin EC 81 MG tablet, Take 81 mg by mouth at bedtime., Disp: , Rfl:    bisoprolol (ZEBETA) 10 MG tablet, Take 10 mg by mouth in the morning., Disp: , Rfl:    Calcium Carbonate-Vitamin D (CALCIUM-D) 600-400 MG-UNIT TABS, Take 1 tablet by mouth every morning., Disp: , Rfl:    clobetasol (TEMOVATE) 0.05 % external solution, Apply 1 application topically daily as needed (scalp itching/psoriasis)., Disp: , Rfl:    docusate sodium (COLACE) 100 MG capsule, Take 1 capsule (100 mg total) by mouth 2 (two) times daily., Disp: 10 capsule, Rfl: 0   enoxaparin (LOVENOX) 30 MG/0.3ML injection, Inject 0.3 mLs (30 mg total) into the skin daily for 28 days., Disp: 0 mL, Rfl:    ferrous sulfate (FERROUSUL) 325 (65 FE) MG tablet, Take 1 tablet (325 mg total) by mouth 2 (two) times daily with a meal. (Patient not taking: Reported on 03/24/2021), Disp: 60 tablet, Rfl: 1   fluocinonide cream (LIDEX) 0.05 %, Apply 1 application topically 2 (two) times daily as needed (psoriasis). , Disp: , Rfl:    gabapentin (NEURONTIN) 300 MG capsule, Take 1 capsule (300 mg total) by mouth 2 (two) times daily., Disp: ,  Rfl:    glipiZIDE (GLUCOTROL XL) 2.5 MG 24 hr tablet, Take 2.5 mg by mouth daily with breakfast., Disp: , Rfl:    insulin glargine (LANTUS SOLOSTAR) 100 UNIT/ML Solostar Pen, Inject 50 Units into the skin at bedtime., Disp: , Rfl:    loratadine (CLARITIN) 10 MG tablet, Take 10 mg by mouth every morning., Disp: , Rfl:    losartan (COZAAR) 25 MG tablet, Take 25 mg by mouth every morning., Disp: , Rfl:    Multiple Vitamins-Iron (MULTIVITAMIN/IRON PO), Take 1 tablet by mouth every morning., Disp: , Rfl:    simvastatin (ZOCOR) 5 MG tablet, Take 5 mg by mouth at bedtime. , Disp: , Rfl:    sitaGLIPtin (JANUVIA) 50 MG tablet, Take 50 mg by mouth  every morning., Disp: , Rfl:    vitamin C (ASCORBIC ACID) 500 MG tablet, Take 500 mg by mouth every morning., Disp: , Rfl:    Cardiovascular and other pertinent studies:  Reviewed external labs and tests, independently interpreted  EKG 12/16/2022: Sinus rhythm 74 bpm Possible anteroseptal infarct Poor R wave progression   Recent labs: 12/09/2022: Glucose 173, BUN/Cr 32/2.47. EGFR 19. Na/K 143/5.5. Protein 5.8, albumin 3.2. Rest of the CMP normal H/H 10.5/33.4. MCV 98. Platelets 309 TSH 1.3 normal   Review of Systems  Cardiovascular:  Positive for dyspnea on exertion and leg swelling. Negative for chest pain, palpitations and syncope.         Vitals:   12/16/22 1108  BP: (!) 146/58  Pulse: 72  Resp: 16  SpO2: 93%     Body mass index is 36.64 kg/m.   Objective:   Physical Exam Vitals and nursing note reviewed.  Constitutional:      General: She is not in acute distress. Neck:     Vascular: No JVD.  Cardiovascular:     Rate and Rhythm: Normal rate and regular rhythm.     Heart sounds: Normal heart sounds. No murmur heard. Pulmonary:     Effort: Pulmonary effort is normal.     Breath sounds: Normal breath sounds. No wheezing or rales.  Musculoskeletal:     Right lower leg: Edema (2+) present.     Left lower leg: Edema (2+) present.           Visit diagnoses:   ICD-10-CM   1. Exertional dyspnea  R06.09 EKG 12-Lead    PCV ECHOCARDIOGRAM COMPLETE    Comprehensive Metabolic Panel (CMET)    Pro b natriuretic peptide (BNP)[facility])    DG Chest 2 View    CANCELED: DG Chest 2 View    2. CKD (chronic kidney disease) stage 4, GFR 15-29 ml/min (HCC)  N18.4        Orders Placed This Encounter  Procedures   DG Chest 2 View   Comprehensive Metabolic Panel (CMET)   Pro b natriuretic peptide (BNP)[facility])   EKG 12-Lead   PCV ECHOCARDIOGRAM COMPLETE       Meds ordered this encounter  Medications    furosemide (LASIX) 40 MG tablet    Sig: Take 1 tablet (40 mg total) by mouth 2 (two) times daily.    Dispense:  90 tablet    Refill:  3     Assessment & Recommendations:   82 y.o. Caucasian female with hypertension, hyperlipidemia, type 2 diabetes mellitus, CKD stage IV, referred for evaluation of exertional dyspnea   Exertional dyspnea: Likely multifactorial due to uncontrolled hypertension, progressive CKD, possibly underlying heart failure. Recommend echocardiogram.  Started oral Lasix 40 mg twice daily. Check CMP and proBNP in 2 weeks. Check daily weights. Discussed low-salt diet. Follow-up with nephrology appointment.  Further recommendations after above testing.  Thank you for referring the patient to [place]. Please feel free to contact with any questions.   Elder Negus, MD Pager: [phone] Office: [phone]

## 2022-12-19 ENCOUNTER — Other Ambulatory Visit: Payer: Self-pay | Admitting: Cardiology

## 2022-12-19 ENCOUNTER — Telehealth: Payer: Self-pay

## 2022-12-19 MED ORDER — BUMETANIDE 1 MG PO TABS: 1.0000 mg | ORAL_TABLET | Freq: Every day | ORAL | 1 refills | Status: DC

## 2022-12-19 NOTE — Telephone Encounter (Signed)
I am not convinced that it was a true allergic reaction. This may just mean that she is quite congested and needs more diuretic. I can switch lasix to a PO Bumex 1 mg bid. I have sent the prescription to pharmacy.  Thanks MJP

## 2022-12-19 NOTE — Telephone Encounter (Signed)
Pt husband called stated his took lasix for the first time yesterday and pt had a allergic reaction to the medication because she is highly allergic to sulfa. Pt husband states he seen a medication that is just as good as lasix called triamterena.

## 2022-12-19 NOTE — Telephone Encounter (Signed)
Stop lasix. I dont think triamterene would be adequate for her level of leg edema. What allergic reaction did she have?  Thanks MJP

## 2022-12-19 NOTE — Telephone Encounter (Signed)
She couldn't breath. She was real "backed up and stuffy" and was coughing all night.

## 2022-12-25 DIAGNOSIS — Z882 Allergy status to sulfonamides status: Secondary | ICD-10-CM | POA: Diagnosis not present

## 2022-12-25 DIAGNOSIS — Z9989 Dependence on other enabling machines and devices: Secondary | ICD-10-CM | POA: Diagnosis not present

## 2022-12-25 DIAGNOSIS — L98492 Non-pressure chronic ulcer of skin of other sites with fat layer exposed: Secondary | ICD-10-CM | POA: Diagnosis not present

## 2022-12-25 DIAGNOSIS — G629 Polyneuropathy, unspecified: Secondary | ICD-10-CM | POA: Diagnosis not present

## 2022-12-30 ENCOUNTER — Ambulatory Visit (HOSPITAL_BASED_OUTPATIENT_CLINIC_OR_DEPARTMENT_OTHER)
Admission: RE | Admit: 2022-12-30 | Discharge: 2022-12-30 | Disposition: A | Discharge status: Home / Self Care | Payer: Medicare PPO | Source: Ambulatory Visit | Attending: Cardiology | Admitting: Cardiology

## 2022-12-30 DIAGNOSIS — R0609 Other forms of dyspnea: Secondary | ICD-10-CM | POA: Insufficient documentation

## 2022-12-30 DIAGNOSIS — R0989 Other specified symptoms and signs involving the circulatory and respiratory systems: Secondary | ICD-10-CM | POA: Diagnosis not present

## 2022-12-30 DIAGNOSIS — J9811 Atelectasis: Secondary | ICD-10-CM | POA: Diagnosis not present

## 2022-12-31 DIAGNOSIS — E1165 Type 2 diabetes mellitus with hyperglycemia: Secondary | ICD-10-CM | POA: Diagnosis not present

## 2023-01-02 ENCOUNTER — Ambulatory Visit: Payer: Medicare PPO

## 2023-01-02 DIAGNOSIS — R0609 Other forms of dyspnea: Secondary | ICD-10-CM

## 2023-01-08 DIAGNOSIS — Z9989 Dependence on other enabling machines and devices: Secondary | ICD-10-CM | POA: Diagnosis not present

## 2023-01-08 DIAGNOSIS — L98492 Non-pressure chronic ulcer of skin of other sites with fat layer exposed: Secondary | ICD-10-CM | POA: Diagnosis not present

## 2023-01-08 DIAGNOSIS — G629 Polyneuropathy, unspecified: Secondary | ICD-10-CM | POA: Diagnosis not present

## 2023-01-08 DIAGNOSIS — Z882 Allergy status to sulfonamides status: Secondary | ICD-10-CM | POA: Diagnosis not present

## 2023-01-15 DIAGNOSIS — G629 Polyneuropathy, unspecified: Secondary | ICD-10-CM | POA: Diagnosis not present

## 2023-01-15 DIAGNOSIS — L98492 Non-pressure chronic ulcer of skin of other sites with fat layer exposed: Secondary | ICD-10-CM | POA: Diagnosis not present

## 2023-01-16 ENCOUNTER — Encounter: Payer: Self-pay | Admitting: Cardiology

## 2023-01-16 ENCOUNTER — Ambulatory Visit: Payer: Medicare PPO | Admitting: Cardiology

## 2023-01-16 VITALS — BP 169/80 | HR 75 | Resp 16 | Ht 65.0 in | Wt 189.0 lb

## 2023-01-16 DIAGNOSIS — R0609 Other forms of dyspnea: Secondary | ICD-10-CM

## 2023-01-16 DIAGNOSIS — I5032 Chronic diastolic (congestive) heart failure: Secondary | ICD-10-CM | POA: Diagnosis not present

## 2023-01-16 MED ORDER — BUMETANIDE 1 MG PO TABS: 1.0000 mg | ORAL_TABLET | Freq: Every day | ORAL | 1 refills | Status: DC

## 2023-01-16 NOTE — Progress Notes (Signed)
Patient referred by Farris Has, MD for exertional dyspnea  Subjective:   Angela Castro, female    DOB: 05-Apr-1941, 82 y.o.   MRN: [id number]   Chief Complaint  Patient presents with   Leg Swelling    HPI  82 y.o. Caucasian female with hypertension, hyperlipidemia, type 2 diabetes mellitus, CKD stage IV, referred for evaluation of exertional dyspnea  Leg swelling improved with bumex. Last couple days, she has not been taking her diuretic and lg swelling has increased. She is seeing nephrology next week.  Reviewed recent test results with the patient, details below.    Initial consultation visit 12/2022: Patient is here with her husband.  They recently moved from [place] city to [place] area.  For last several months, patient has had progressively worsening leg edema and exertional dyspnea.  She denies any chest pain, palpitations, presyncope or syncope.  She was recently found to have creatinine of 2.47, which reportedly has been "up-and-down".  She is awaiting an appointment with nephrology.  Blood pressure slightly elevated today.  She is currently on no diuretics.   Current Outpatient Medications:    allopurinol (ZYLOPRIM) 100 MG tablet, Take 100 mg by mouth 2 (two) times daily. , Disp: , Rfl:    amLODipine (NORVASC) 5 MG tablet, Take 5 mg by mouth every morning., Disp: , Rfl:    aspirin EC 81 MG tablet, Take 81 mg by mouth at bedtime., Disp: , Rfl:    bisoprolol (ZEBETA) 10 MG tablet, Take 10 mg by mouth in the morning., Disp: , Rfl:    bumetanide (BUMEX) 1 MG tablet, Take 1 tablet (1 mg total) by mouth daily., Disp: 60 tablet, Rfl: 1   calcium carbonate (SUPER CALCIUM) 1500 (600 Ca) MG TABS tablet, 1 tablet with meals Orally once a day, Disp: , Rfl:    Calcium Carbonate-Vitamin D (CALCIUM-D) 600-400 MG-UNIT TABS, Take 1 tablet by mouth every morning., Disp: , Rfl:    cholecalciferol (VITAMIN D3) 25 MCG (1000 UNIT) tablet, Take 1,000 Units by mouth daily., Disp: , Rfl:     clobetasol (TEMOVATE) 0.05 % external solution, Apply 1 application topically daily as needed (scalp itching/psoriasis)., Disp: , Rfl:    ferrous sulfate (FERROUSUL) 325 (65 FE) MG tablet, Take 1 tablet (325 mg total) by mouth 2 (two) times daily with a meal., Disp: 60 tablet, Rfl: 1   fluocinonide cream (LIDEX) 0.05 %, Apply 1 application topically 2 (two) times daily as needed (psoriasis). , Disp: , Rfl:    gabapentin (NEURONTIN) 300 MG capsule, Take 1 capsule (300 mg total) by mouth 2 (two) times daily. (Patient taking differently: Take 400 mg by mouth 2 (two) times daily.), Disp: , Rfl:    glipiZIDE (GLUCOTROL XL) 5 MG 24 hr tablet, Take 5 mg by mouth daily with breakfast., Disp: , Rfl:    insulin glargine (LANTUS SOLOSTAR) 100 UNIT/ML Solostar Pen, Inject 50 Units into the skin at bedtime. (Patient taking differently: Inject 60 Units into the skin at bedtime.), Disp: , Rfl:    loratadine (CLARITIN) 10 MG tablet, Take 10 mg by mouth every morning., Disp: , Rfl:    losartan (COZAAR) 25 MG tablet, Take 25 mg by mouth every morning., Disp: , Rfl:    Multiple Vitamins-Iron (MULTIVITAMIN/IRON PO), Take 1 tablet by mouth every morning., Disp: , Rfl:    simvastatin (ZOCOR) 5 MG tablet, Take 5 mg by mouth at bedtime. , Disp: , Rfl:    sitaGLIPtin (JANUVIA) 50 MG tablet, Take 50  mg by mouth every morning., Disp: , Rfl:    traMADol (ULTRAM) 50 MG tablet, Take 50 mg by mouth every 6 (six) hours as needed., Disp: , Rfl:    triamcinolone ointment (KENALOG) 0.1 %, Apply 1 Application topically., Disp: , Rfl:    vitamin C (ASCORBIC ACID) 500 MG tablet, Take 500 mg by mouth every morning., Disp: , Rfl:    Wound Cleansers (VASHE CLEANSING) SOLN, Apply topically., Disp: , Rfl:    Cardiovascular and other pertinent studies:  Reviewed external labs and tests, independently interpreted  EKG 12/16/2022: Sinus rhythm 74 bpm Possible anteroseptal infarct Poor R wave progression  Echocardiogram 01/02/2023:  Left  ventricle cavity is normal in size. Mild concentric hypertrophy of  the left ventricle. Normal global wall motion. Normal LV systolic function  with EF 71%. Doppler evidence of grade I (impaired) diastolic dysfunction,  normal LAP.  Mild (Grade I) mitral regurgitation.  Mild tricuspid regurgitation. Estimated pulmonary artery systolic pressure  42 mmHg.   Recent labs: 12/09/2022: Glucose 173, BUN/Cr 32/2.47. EGFR 19. Na/K 143/5.5. Protein 5.8, albumin 3.2. Rest of the CMP normal H/H 10.5/33.4. MCV 98. Platelets 309 TSH 1.3 normal   Review of Systems  Cardiovascular:  Positive for dyspnea on exertion and leg swelling. Negative for chest pain, palpitations and syncope.         Vitals:   01/16/23 1324  BP: (!) 169/80  Pulse: 75  Resp: 16  SpO2: 99%      Body mass index is 31.45 kg/m.   Objective:   Physical Exam Vitals and nursing note reviewed.  Constitutional:      General: She is not in acute distress. Neck:     Vascular: No JVD.  Cardiovascular:     Rate and Rhythm: Normal rate and regular rhythm.     Heart sounds: Normal heart sounds. No murmur heard. Pulmonary:     Effort: Pulmonary effort is normal.     Breath sounds: Normal breath sounds. No wheezing or rales.  Musculoskeletal:     Right lower leg: Edema (2+) present.     Left lower leg: Edema (2+) present.          Visit diagnoses:   ICD-10-CM   1. Exertional dyspnea  R06.09     2. Chronic heart failure with preserved ejection fraction (HCC)  I50.32          Meds ordered this encounter  Medications   bumetanide (BUMEX) 1 MG tablet    Sig: Take 1 tablet (1 mg total) by mouth daily.    Dispense:  90 tablet    Refill:  1     Assessment & Recommendations:   82 y.o. Caucasian female with hypertension, hyperlipidemia, type 2 diabetes mellitus, CKD stage IV, referred for evaluation of exertional dyspnea   Exertional dyspnea: HFpEF due to uncontrolled hypertension, progressive CKD. Medical  management limited due to GFR <20. Continue Bumex 1 mg daily, emphasized compliance. Recommend follow-up with nephrology appointment.  F/u in 6 months    Elder Negus, MD Pager: [phone] Office: [phone]

## 2023-01-26 DIAGNOSIS — I129 Hypertensive chronic kidney disease with stage 1 through stage 4 chronic kidney disease, or unspecified chronic kidney disease: Secondary | ICD-10-CM | POA: Diagnosis not present

## 2023-01-26 DIAGNOSIS — R809 Proteinuria, unspecified: Secondary | ICD-10-CM | POA: Diagnosis not present

## 2023-01-26 DIAGNOSIS — N184 Chronic kidney disease, stage 4 (severe): Secondary | ICD-10-CM | POA: Diagnosis not present

## 2023-01-26 DIAGNOSIS — E875 Hyperkalemia: Secondary | ICD-10-CM | POA: Diagnosis not present

## 2023-01-26 DIAGNOSIS — D631 Anemia in chronic kidney disease: Secondary | ICD-10-CM | POA: Diagnosis not present

## 2023-01-27 ENCOUNTER — Other Ambulatory Visit: Payer: Self-pay | Admitting: Nephrology

## 2023-01-27 DIAGNOSIS — N184 Chronic kidney disease, stage 4 (severe): Secondary | ICD-10-CM

## 2023-01-27 LAB — LAB REPORT - SCANNED: EGFR: 16

## 2023-02-03 ENCOUNTER — Ambulatory Visit
Admission: RE | Admit: 2023-02-03 | Discharge: 2023-02-03 | Disposition: A | Discharge status: Home / Self Care | Payer: Medicare PPO | Source: Ambulatory Visit | Attending: Nephrology | Admitting: Nephrology

## 2023-02-03 DIAGNOSIS — N184 Chronic kidney disease, stage 4 (severe): Secondary | ICD-10-CM | POA: Diagnosis not present

## 2023-02-09 DIAGNOSIS — R809 Proteinuria, unspecified: Secondary | ICD-10-CM | POA: Diagnosis not present

## 2023-02-09 DIAGNOSIS — N184 Chronic kidney disease, stage 4 (severe): Secondary | ICD-10-CM | POA: Diagnosis not present

## 2023-02-09 DIAGNOSIS — E1122 Type 2 diabetes mellitus with diabetic chronic kidney disease: Secondary | ICD-10-CM | POA: Diagnosis not present

## 2023-02-18 ENCOUNTER — Other Ambulatory Visit: Payer: Self-pay

## 2023-02-18 ENCOUNTER — Emergency Department (HOSPITAL_COMMUNITY)
Admission: EM | Admit: 2023-02-18 | Discharge: 2023-02-19 | Discharge status: Against Medical Advice | Payer: Medicare PPO | Source: Home / Self Care

## 2023-02-18 ENCOUNTER — Emergency Department (HOSPITAL_COMMUNITY): Payer: Medicare PPO

## 2023-02-18 DIAGNOSIS — Z5321 Procedure and treatment not carried out due to patient leaving prior to being seen by health care provider: Secondary | ICD-10-CM | POA: Insufficient documentation

## 2023-02-18 DIAGNOSIS — L089 Local infection of the skin and subcutaneous tissue, unspecified: Secondary | ICD-10-CM | POA: Insufficient documentation

## 2023-02-18 DIAGNOSIS — G8918 Other acute postprocedural pain: Secondary | ICD-10-CM | POA: Diagnosis not present

## 2023-02-18 DIAGNOSIS — N184 Chronic kidney disease, stage 4 (severe): Secondary | ICD-10-CM | POA: Diagnosis present

## 2023-02-18 DIAGNOSIS — I517 Cardiomegaly: Secondary | ICD-10-CM | POA: Diagnosis not present

## 2023-02-18 DIAGNOSIS — L0390 Cellulitis, unspecified: Secondary | ICD-10-CM | POA: Diagnosis not present

## 2023-02-18 DIAGNOSIS — M65941 Unspecified synovitis and tenosynovitis, right hand: Secondary | ICD-10-CM | POA: Diagnosis not present

## 2023-02-18 DIAGNOSIS — L97519 Non-pressure chronic ulcer of other part of right foot with unspecified severity: Secondary | ICD-10-CM | POA: Diagnosis not present

## 2023-02-18 DIAGNOSIS — A419 Sepsis, unspecified organism: Secondary | ICD-10-CM | POA: Diagnosis present

## 2023-02-18 DIAGNOSIS — M86141 Other acute osteomyelitis, right hand: Secondary | ICD-10-CM | POA: Diagnosis not present

## 2023-02-18 DIAGNOSIS — L03011 Cellulitis of right finger: Secondary | ICD-10-CM | POA: Diagnosis present

## 2023-02-18 DIAGNOSIS — L98496 Non-pressure chronic ulcer of skin of other sites with bone involvement without evidence of necrosis: Secondary | ICD-10-CM | POA: Diagnosis present

## 2023-02-18 DIAGNOSIS — Y835 Amputation of limb(s) as the cause of abnormal reaction of the patient, or of later complication, without mention of misadventure at the time of the procedure: Secondary | ICD-10-CM | POA: Diagnosis present

## 2023-02-18 DIAGNOSIS — E119 Type 2 diabetes mellitus without complications: Secondary | ICD-10-CM | POA: Diagnosis not present

## 2023-02-18 DIAGNOSIS — B966 Bacteroides fragilis [B. fragilis] as the cause of diseases classified elsewhere: Secondary | ICD-10-CM | POA: Diagnosis present

## 2023-02-18 DIAGNOSIS — E11621 Type 2 diabetes mellitus with foot ulcer: Secondary | ICD-10-CM | POA: Diagnosis present

## 2023-02-18 DIAGNOSIS — R5381 Other malaise: Secondary | ICD-10-CM | POA: Diagnosis not present

## 2023-02-18 DIAGNOSIS — G5601 Carpal tunnel syndrome, right upper limb: Secondary | ICD-10-CM | POA: Diagnosis present

## 2023-02-18 DIAGNOSIS — E1152 Type 2 diabetes mellitus with diabetic peripheral angiopathy with gangrene: Secondary | ICD-10-CM | POA: Diagnosis present

## 2023-02-18 DIAGNOSIS — I10 Essential (primary) hypertension: Secondary | ICD-10-CM | POA: Diagnosis not present

## 2023-02-18 DIAGNOSIS — I130 Hypertensive heart and chronic kidney disease with heart failure and stage 1 through stage 4 chronic kidney disease, or unspecified chronic kidney disease: Secondary | ICD-10-CM | POA: Diagnosis present

## 2023-02-18 DIAGNOSIS — E11622 Type 2 diabetes mellitus with other skin ulcer: Secondary | ICD-10-CM | POA: Diagnosis not present

## 2023-02-18 DIAGNOSIS — N183 Chronic kidney disease, stage 3 unspecified: Secondary | ICD-10-CM | POA: Diagnosis not present

## 2023-02-18 DIAGNOSIS — R6520 Severe sepsis without septic shock: Secondary | ICD-10-CM | POA: Diagnosis present

## 2023-02-18 DIAGNOSIS — J45909 Unspecified asthma, uncomplicated: Secondary | ICD-10-CM | POA: Diagnosis present

## 2023-02-18 DIAGNOSIS — B9689 Other specified bacterial agents as the cause of diseases classified elsewhere: Secondary | ICD-10-CM | POA: Diagnosis present

## 2023-02-18 DIAGNOSIS — M85841 Other specified disorders of bone density and structure, right hand: Secondary | ICD-10-CM | POA: Diagnosis not present

## 2023-02-18 DIAGNOSIS — I129 Hypertensive chronic kidney disease with stage 1 through stage 4 chronic kidney disease, or unspecified chronic kidney disease: Secondary | ICD-10-CM | POA: Diagnosis not present

## 2023-02-18 DIAGNOSIS — M65141 Other infective (teno)synovitis, right hand: Secondary | ICD-10-CM | POA: Diagnosis present

## 2023-02-18 DIAGNOSIS — M869 Osteomyelitis, unspecified: Secondary | ICD-10-CM | POA: Diagnosis present

## 2023-02-18 DIAGNOSIS — E869 Volume depletion, unspecified: Secondary | ICD-10-CM | POA: Diagnosis not present

## 2023-02-18 DIAGNOSIS — L02511 Cutaneous abscess of right hand: Secondary | ICD-10-CM | POA: Diagnosis present

## 2023-02-18 DIAGNOSIS — M7989 Other specified soft tissue disorders: Secondary | ICD-10-CM | POA: Diagnosis not present

## 2023-02-18 DIAGNOSIS — N179 Acute kidney failure, unspecified: Secondary | ICD-10-CM | POA: Diagnosis present

## 2023-02-18 DIAGNOSIS — E1142 Type 2 diabetes mellitus with diabetic polyneuropathy: Secondary | ICD-10-CM | POA: Diagnosis present

## 2023-02-18 DIAGNOSIS — L98492 Non-pressure chronic ulcer of skin of other sites with fat layer exposed: Secondary | ICD-10-CM | POA: Diagnosis not present

## 2023-02-18 DIAGNOSIS — R918 Other nonspecific abnormal finding of lung field: Secondary | ICD-10-CM | POA: Diagnosis not present

## 2023-02-18 DIAGNOSIS — E1122 Type 2 diabetes mellitus with diabetic chronic kidney disease: Secondary | ICD-10-CM | POA: Diagnosis present

## 2023-02-18 DIAGNOSIS — E1140 Type 2 diabetes mellitus with diabetic neuropathy, unspecified: Secondary | ICD-10-CM | POA: Diagnosis not present

## 2023-02-18 DIAGNOSIS — B954 Other streptococcus as the cause of diseases classified elsewhere: Secondary | ICD-10-CM | POA: Diagnosis present

## 2023-02-18 DIAGNOSIS — B964 Proteus (mirabilis) (morganii) as the cause of diseases classified elsewhere: Secondary | ICD-10-CM | POA: Diagnosis present

## 2023-02-18 DIAGNOSIS — D649 Anemia, unspecified: Secondary | ICD-10-CM | POA: Diagnosis not present

## 2023-02-18 DIAGNOSIS — Z794 Long term (current) use of insulin: Secondary | ICD-10-CM | POA: Diagnosis not present

## 2023-02-18 DIAGNOSIS — R0602 Shortness of breath: Secondary | ICD-10-CM | POA: Diagnosis not present

## 2023-02-18 DIAGNOSIS — D509 Iron deficiency anemia, unspecified: Secondary | ICD-10-CM | POA: Diagnosis present

## 2023-02-18 DIAGNOSIS — I5032 Chronic diastolic (congestive) heart failure: Secondary | ICD-10-CM | POA: Diagnosis present

## 2023-02-18 DIAGNOSIS — E1165 Type 2 diabetes mellitus with hyperglycemia: Secondary | ICD-10-CM | POA: Diagnosis present

## 2023-02-18 DIAGNOSIS — T8741 Infection of amputation stump, right upper extremity: Secondary | ICD-10-CM | POA: Diagnosis present

## 2023-02-18 LAB — CBC WITH DIFFERENTIAL/PLATELET
Abs Immature Granulocytes: 0.1 10*3/uL — ABNORMAL HIGH (ref 0.00–0.07)
Basophils Absolute: 0.1 10*3/uL (ref 0.0–0.1)
Basophils Relative: 1 %
Eosinophils Absolute: 0.2 10*3/uL (ref 0.0–0.5)
Eosinophils Relative: 1 %
HCT: 28.5 % — ABNORMAL LOW (ref 36.0–46.0)
Hemoglobin: 8.9 g/dL — ABNORMAL LOW (ref 12.0–15.0)
Immature Granulocytes: 1 %
Lymphocytes Relative: 7 %
Lymphs Abs: 1.3 10*3/uL (ref 0.7–4.0)
MCH: 30.3 pg (ref 26.0–34.0)
MCHC: 31.2 g/dL (ref 30.0–36.0)
MCV: 96.9 fL (ref 80.0–100.0)
Monocytes Absolute: 1.5 10*3/uL — ABNORMAL HIGH (ref 0.1–1.0)
Monocytes Relative: 8 %
Neutro Abs: 15.6 10*3/uL — ABNORMAL HIGH (ref 1.7–7.7)
Neutrophils Relative %: 82 %
Platelets: 303 10*3/uL (ref 150–400)
RBC: 2.94 MIL/uL — ABNORMAL LOW (ref 3.87–5.11)
RDW: 14.1 % (ref 11.5–15.5)
WBC: 18.6 10*3/uL — ABNORMAL HIGH (ref 4.0–10.5)
nRBC: 0.1 % (ref 0.0–0.2)

## 2023-02-18 LAB — BASIC METABOLIC PANEL
Anion gap: 12 (ref 5–15)
BUN: 42 mg/dL — ABNORMAL HIGH (ref 8–23)
CO2: 22 mmol/L (ref 22–32)
Calcium: 8.7 mg/dL — ABNORMAL LOW (ref 8.9–10.3)
Chloride: 106 mmol/L (ref 98–111)
Creatinine, Ser: 3.73 mg/dL — ABNORMAL HIGH (ref 0.44–1.00)
GFR, Estimated: 12 mL/min — ABNORMAL LOW (ref >60)
Glucose, Bld: 130 mg/dL — ABNORMAL HIGH (ref 70–99)
Potassium: 4 mmol/L (ref 3.5–5.1)
Sodium: 140 mmol/L (ref 135–145)

## 2023-02-18 NOTE — ED Triage Notes (Signed)
Pt arrives to ED for R middle digit infection. Pt states it started swelling late Sunday. Pt with wounds on multiple fingers. Pt denies fevers/chills. Pt T2DM

## 2023-02-18 NOTE — ED Notes (Signed)
Pt left AMA stating that they have been waiting for a long time

## 2023-02-19 ENCOUNTER — Inpatient Hospital Stay (HOSPITAL_BASED_OUTPATIENT_CLINIC_OR_DEPARTMENT_OTHER)
Admission: EM | Admit: 2023-02-19 | Discharge: 2023-02-25 | DRG: 513 | Disposition: A | Discharge status: Home Health | Payer: Medicare PPO | Attending: Internal Medicine | Admitting: Internal Medicine

## 2023-02-19 ENCOUNTER — Inpatient Hospital Stay (HOSPITAL_COMMUNITY): Payer: Medicare PPO

## 2023-02-19 ENCOUNTER — Other Ambulatory Visit (HOSPITAL_BASED_OUTPATIENT_CLINIC_OR_DEPARTMENT_OTHER): Payer: Self-pay

## 2023-02-19 ENCOUNTER — Encounter (HOSPITAL_BASED_OUTPATIENT_CLINIC_OR_DEPARTMENT_OTHER): Payer: Self-pay

## 2023-02-19 DIAGNOSIS — I5032 Chronic diastolic (congestive) heart failure: Secondary | ICD-10-CM | POA: Diagnosis present

## 2023-02-19 DIAGNOSIS — L98496 Non-pressure chronic ulcer of skin of other sites with bone involvement without evidence of necrosis: Secondary | ICD-10-CM | POA: Diagnosis present

## 2023-02-19 DIAGNOSIS — D509 Iron deficiency anemia, unspecified: Secondary | ICD-10-CM | POA: Diagnosis present

## 2023-02-19 DIAGNOSIS — E1165 Type 2 diabetes mellitus with hyperglycemia: Secondary | ICD-10-CM | POA: Diagnosis present

## 2023-02-19 DIAGNOSIS — R6520 Severe sepsis without septic shock: Secondary | ICD-10-CM | POA: Diagnosis present

## 2023-02-19 DIAGNOSIS — I130 Hypertensive heart and chronic kidney disease with heart failure and stage 1 through stage 4 chronic kidney disease, or unspecified chronic kidney disease: Secondary | ICD-10-CM | POA: Diagnosis present

## 2023-02-19 DIAGNOSIS — E1152 Type 2 diabetes mellitus with diabetic peripheral angiopathy with gangrene: Secondary | ICD-10-CM | POA: Diagnosis present

## 2023-02-19 DIAGNOSIS — E1122 Type 2 diabetes mellitus with diabetic chronic kidney disease: Secondary | ICD-10-CM | POA: Diagnosis present

## 2023-02-19 DIAGNOSIS — Z7984 Long term (current) use of oral hypoglycemic drugs: Secondary | ICD-10-CM

## 2023-02-19 DIAGNOSIS — M65141 Other infective (teno)synovitis, right hand: Secondary | ICD-10-CM | POA: Diagnosis present

## 2023-02-19 DIAGNOSIS — M109 Gout, unspecified: Secondary | ICD-10-CM | POA: Diagnosis present

## 2023-02-19 DIAGNOSIS — M869 Osteomyelitis, unspecified: Secondary | ICD-10-CM | POA: Diagnosis present

## 2023-02-19 DIAGNOSIS — B966 Bacteroides fragilis [B. fragilis] as the cause of diseases classified elsewhere: Secondary | ICD-10-CM | POA: Diagnosis present

## 2023-02-19 DIAGNOSIS — Y835 Amputation of limb(s) as the cause of abnormal reaction of the patient, or of later complication, without mention of misadventure at the time of the procedure: Secondary | ICD-10-CM | POA: Diagnosis present

## 2023-02-19 DIAGNOSIS — B954 Other streptococcus as the cause of diseases classified elsewhere: Secondary | ICD-10-CM | POA: Diagnosis present

## 2023-02-19 DIAGNOSIS — Z96659 Presence of unspecified artificial knee joint: Secondary | ICD-10-CM | POA: Diagnosis present

## 2023-02-19 DIAGNOSIS — E785 Hyperlipidemia, unspecified: Secondary | ICD-10-CM | POA: Diagnosis present

## 2023-02-19 DIAGNOSIS — M86141 Other acute osteomyelitis, right hand: Secondary | ICD-10-CM

## 2023-02-19 DIAGNOSIS — N179 Acute kidney failure, unspecified: Secondary | ICD-10-CM | POA: Diagnosis present

## 2023-02-19 DIAGNOSIS — B964 Proteus (mirabilis) (morganii) as the cause of diseases classified elsewhere: Secondary | ICD-10-CM | POA: Diagnosis present

## 2023-02-19 DIAGNOSIS — I10 Essential (primary) hypertension: Secondary | ICD-10-CM | POA: Diagnosis present

## 2023-02-19 DIAGNOSIS — E119 Type 2 diabetes mellitus without complications: Secondary | ICD-10-CM

## 2023-02-19 DIAGNOSIS — Z881 Allergy status to other antibiotic agents status: Secondary | ICD-10-CM

## 2023-02-19 DIAGNOSIS — N184 Chronic kidney disease, stage 4 (severe): Secondary | ICD-10-CM

## 2023-02-19 DIAGNOSIS — E869 Volume depletion, unspecified: Secondary | ICD-10-CM | POA: Diagnosis present

## 2023-02-19 DIAGNOSIS — Z79899 Other long term (current) drug therapy: Secondary | ICD-10-CM

## 2023-02-19 DIAGNOSIS — I129 Hypertensive chronic kidney disease with stage 1 through stage 4 chronic kidney disease, or unspecified chronic kidney disease: Secondary | ICD-10-CM | POA: Diagnosis not present

## 2023-02-19 DIAGNOSIS — Z794 Long term (current) use of insulin: Secondary | ICD-10-CM

## 2023-02-19 DIAGNOSIS — B9689 Other specified bacterial agents as the cause of diseases classified elsewhere: Secondary | ICD-10-CM | POA: Diagnosis present

## 2023-02-19 DIAGNOSIS — L03011 Cellulitis of right finger: Principal | ICD-10-CM | POA: Diagnosis present

## 2023-02-19 DIAGNOSIS — Z882 Allergy status to sulfonamides status: Secondary | ICD-10-CM

## 2023-02-19 DIAGNOSIS — D649 Anemia, unspecified: Secondary | ICD-10-CM | POA: Diagnosis present

## 2023-02-19 DIAGNOSIS — E1142 Type 2 diabetes mellitus with diabetic polyneuropathy: Secondary | ICD-10-CM | POA: Diagnosis present

## 2023-02-19 DIAGNOSIS — E11621 Type 2 diabetes mellitus with foot ulcer: Secondary | ICD-10-CM | POA: Diagnosis present

## 2023-02-19 DIAGNOSIS — L02511 Cutaneous abscess of right hand: Secondary | ICD-10-CM | POA: Diagnosis present

## 2023-02-19 DIAGNOSIS — Z888 Allergy status to other drugs, medicaments and biological substances status: Secondary | ICD-10-CM

## 2023-02-19 DIAGNOSIS — Z8249 Family history of ischemic heart disease and other diseases of the circulatory system: Secondary | ICD-10-CM

## 2023-02-19 DIAGNOSIS — L0390 Cellulitis, unspecified: Secondary | ICD-10-CM | POA: Diagnosis not present

## 2023-02-19 DIAGNOSIS — A419 Sepsis, unspecified organism: Secondary | ICD-10-CM | POA: Diagnosis present

## 2023-02-19 DIAGNOSIS — G5601 Carpal tunnel syndrome, right upper limb: Secondary | ICD-10-CM | POA: Diagnosis present

## 2023-02-19 DIAGNOSIS — T8741 Infection of amputation stump, right upper extremity: Principal | ICD-10-CM | POA: Diagnosis present

## 2023-02-19 DIAGNOSIS — J45909 Unspecified asthma, uncomplicated: Secondary | ICD-10-CM | POA: Diagnosis present

## 2023-02-19 DIAGNOSIS — N183 Chronic kidney disease, stage 3 unspecified: Secondary | ICD-10-CM | POA: Diagnosis not present

## 2023-02-19 DIAGNOSIS — Z89021 Acquired absence of right finger(s): Secondary | ICD-10-CM

## 2023-02-19 LAB — SEDIMENTATION RATE: Sed Rate: 134 mm/h — ABNORMAL HIGH (ref 0–22)

## 2023-02-19 LAB — CBC WITH DIFFERENTIAL/PLATELET
Abs Immature Granulocytes: 0.1 10*3/uL — ABNORMAL HIGH (ref 0.00–0.07)
Basophils Absolute: 0.1 10*3/uL (ref 0.0–0.1)
Basophils Relative: 0 %
Eosinophils Absolute: 0.3 10*3/uL (ref 0.0–0.5)
Eosinophils Relative: 2 %
HCT: 27.6 % — ABNORMAL LOW (ref 36.0–46.0)
Hemoglobin: 9 g/dL — ABNORMAL LOW (ref 12.0–15.0)
Immature Granulocytes: 1 %
Lymphocytes Relative: 8 %
Lymphs Abs: 1.6 10*3/uL (ref 0.7–4.0)
MCH: 31 pg (ref 26.0–34.0)
MCHC: 32.6 g/dL (ref 30.0–36.0)
MCV: 95.2 fL (ref 80.0–100.0)
Monocytes Absolute: 1.5 10*3/uL — ABNORMAL HIGH (ref 0.1–1.0)
Monocytes Relative: 8 %
Neutro Abs: 15.1 10*3/uL — ABNORMAL HIGH (ref 1.7–7.7)
Neutrophils Relative %: 81 %
Platelets: 304 10*3/uL (ref 150–400)
RBC: 2.9 MIL/uL — ABNORMAL LOW (ref 3.87–5.11)
RDW: 14.3 % (ref 11.5–15.5)
WBC: 18.7 10*3/uL — ABNORMAL HIGH (ref 4.0–10.5)
nRBC: 0 % (ref 0.0–0.2)

## 2023-02-19 LAB — TSH: TSH: 0.466 u[IU]/mL (ref 0.350–4.500)

## 2023-02-19 LAB — COMPREHENSIVE METABOLIC PANEL
ALT: 13 U/L (ref 0–44)
AST: 27 U/L (ref 15–41)
Albumin: 3.1 g/dL — ABNORMAL LOW (ref 3.5–5.0)
Alkaline Phosphatase: 68 U/L (ref 38–126)
Anion gap: 9 (ref 5–15)
BUN: 50 mg/dL — ABNORMAL HIGH (ref 8–23)
CO2: 26 mmol/L (ref 22–32)
Calcium: 9 mg/dL (ref 8.9–10.3)
Chloride: 104 mmol/L (ref 98–111)
Creatinine, Ser: 3.84 mg/dL — ABNORMAL HIGH (ref 0.44–1.00)
GFR, Estimated: 11 mL/min — ABNORMAL LOW (ref >60)
Glucose, Bld: 142 mg/dL — ABNORMAL HIGH (ref 70–99)
Potassium: 4.1 mmol/L (ref 3.5–5.1)
Sodium: 139 mmol/L (ref 135–145)
Total Bilirubin: 0.7 mg/dL (ref 0.3–1.2)
Total Protein: 6.6 g/dL (ref 6.5–8.1)

## 2023-02-19 LAB — PHOSPHORUS: Phosphorus: 4.3 mg/dL (ref 2.5–4.6)

## 2023-02-19 LAB — OSMOLALITY: Osmolality: 310 mosm/kg — ABNORMAL HIGH (ref 275–295)

## 2023-02-19 LAB — CREATININE, URINE, RANDOM: Creatinine, Urine: 119 mg/dL

## 2023-02-19 LAB — C-REACTIVE PROTEIN: CRP: 26.9 mg/dL — ABNORMAL HIGH (ref <1.0)

## 2023-02-19 LAB — HEMOGLOBIN A1C
Hgb A1c MFr Bld: 7.3 % — ABNORMAL HIGH (ref 4.8–5.6)
Mean Plasma Glucose: 162.81 mg/dL

## 2023-02-19 LAB — MAGNESIUM: Magnesium: 1.3 mg/dL — ABNORMAL LOW (ref 1.7–2.4)

## 2023-02-19 LAB — GLUCOSE, CAPILLARY: Glucose-Capillary: 200 mg/dL — ABNORMAL HIGH (ref 70–99)

## 2023-02-19 LAB — PREALBUMIN: Prealbumin: 8 mg/dL — ABNORMAL LOW (ref 18–38)

## 2023-02-19 LAB — CK: Total CK: 576 U/L — ABNORMAL HIGH (ref 38–234)

## 2023-02-19 LAB — SODIUM, URINE, RANDOM: Sodium, Ur: 27 mmol/L

## 2023-02-19 MED ORDER — VANCOMYCIN HCL 500 MG/100ML IV SOLN: 500.0000 mg | INTRAVENOUS | Status: DC

## 2023-02-19 MED ORDER — ONDANSETRON HCL 4 MG PO TABS: 4.0000 mg | ORAL_TABLET | Freq: Four times a day (QID) | ORAL | Status: DC | PRN

## 2023-02-19 MED ORDER — GABAPENTIN 400 MG PO CAPS
400.0000 mg | ORAL_CAPSULE | Freq: Two times a day (BID) | ORAL | Status: DC
  Administered 2023-02-20 – 2023-02-22 (×5): 400 mg via ORAL
  Filled 2023-02-19 (×5): qty 1

## 2023-02-19 MED ORDER — AMLODIPINE BESYLATE 5 MG PO TABS: 5.0000 mg | ORAL_TABLET | Freq: Every morning | ORAL | Status: DC

## 2023-02-19 MED ORDER — ACETAMINOPHEN 650 MG RE SUPP: 650.0000 mg | Freq: Four times a day (QID) | RECTAL | Status: DC | PRN

## 2023-02-19 MED ORDER — VANCOMYCIN HCL IN DEXTROSE 1-5 GM/200ML-% IV SOLN
1000.0000 mg | Freq: Once | INTRAVENOUS | Status: AC
  Administered 2023-02-19: 1000 mg via INTRAVENOUS
  Filled 2023-02-19: qty 200

## 2023-02-19 MED ORDER — ONDANSETRON HCL 4 MG/2ML IJ SOLN: 4.0000 mg | Freq: Four times a day (QID) | INTRAMUSCULAR | Status: DC | PRN

## 2023-02-19 MED ORDER — BISOPROLOL FUMARATE 10 MG PO TABS
10.0000 mg | ORAL_TABLET | Freq: Every day | ORAL | Status: DC
  Administered 2023-02-20 – 2023-02-25 (×6): 10 mg via ORAL
  Filled 2023-02-19 (×6): qty 1

## 2023-02-19 MED ORDER — HYDROCODONE-ACETAMINOPHEN 5-325 MG PO TABS
1.0000 | ORAL_TABLET | ORAL | Status: DC | PRN
  Administered 2023-02-20: 2 via ORAL
  Administered 2023-02-21: 1 via ORAL
  Administered 2023-02-21: 2 via ORAL
  Administered 2023-02-21: 1 via ORAL
  Filled 2023-02-19: qty 1
  Filled 2023-02-19 (×2): qty 2
  Filled 2023-02-19: qty 1

## 2023-02-19 MED ORDER — SIMVASTATIN 5 MG PO TABS
5.0000 mg | ORAL_TABLET | Freq: Every day | ORAL | Status: DC
  Administered 2023-02-20 – 2023-02-24 (×5): 5 mg via ORAL
  Filled 2023-02-19 (×7): qty 1

## 2023-02-19 MED ORDER — INSULIN ASPART 100 UNIT/ML IJ SOLN
0.0000 [IU] | INTRAMUSCULAR | Status: DC
  Administered 2023-02-20: 2 [IU] via SUBCUTANEOUS
  Administered 2023-02-21: 3 [IU] via SUBCUTANEOUS

## 2023-02-19 MED ORDER — ALLOPURINOL 100 MG PO TABS
100.0000 mg | ORAL_TABLET | Freq: Two times a day (BID) | ORAL | Status: DC
  Administered 2023-02-20 – 2023-02-25 (×11): 100 mg via ORAL
  Filled 2023-02-19 (×11): qty 1

## 2023-02-19 MED ORDER — SODIUM CHLORIDE 0.9 % IV SOLN
2.0000 g | INTRAVENOUS | Status: DC
  Administered 2023-02-20 – 2023-02-22 (×3): 2 g via INTRAVENOUS
  Filled 2023-02-19 (×3): qty 12.5

## 2023-02-19 MED ORDER — ACETAMINOPHEN 325 MG PO TABS
650.0000 mg | ORAL_TABLET | Freq: Four times a day (QID) | ORAL | Status: DC | PRN
  Administered 2023-02-20 – 2023-02-23 (×4): 650 mg via ORAL
  Filled 2023-02-19 (×4): qty 2

## 2023-02-19 MED ORDER — INSULIN GLARGINE-YFGN 100 UNIT/ML ~~LOC~~ SOLN
40.0000 [IU] | Freq: Every day | SUBCUTANEOUS | Status: DC
  Administered 2023-02-20: 40 [IU] via SUBCUTANEOUS
  Filled 2023-02-19 (×2): qty 0.4

## 2023-02-19 NOTE — H&P (Signed)
Angela Castro ZOX:[id number] DOB: Apr 11, 1941 DOA: 02/19/2023     PCP: Farris Has, MD   Outpatient Specialists:    Orthopedics Dr. Victorino Dike Patient arrived to ER on 02/19/23 at 1244 Referred by Attending No att. providers found  Patient coming from:    home Lives With family     Chief Complaint:   Chief Complaint  Patient presents with   hand swelling    HPI: Angela Castro is a 82 y.o. female with medical history significant of DM2, diastolic CHF,  CKD 3, IDA, gout, neuropathy    Presented with  right hand 3rd finger swelling and redness  Presents with a right hand third finger swelling redness was at wound care yesterday and was told to go to emergency department has history of wounds that required an amputation with underlying history of diabetes there is some drainage of a right hand finger Purulent drainage for the past 3 days She was originally seen at [facility], ER but left without being seen by provider Not associated with nausea vomiting or diarrhea    Denies significant ETOH intake  Does not smoke  Lab Results  Component Value Date   SARSCOV2NAA NEGATIVE 03/28/2021   SARSCOV2NAA NEGATIVE 03/24/2021   SARSCOV2NAA Not Detected 03/17/2019       Regarding pertinent Chronic problems:   Gout on Allopurinol    Hyperlipidemia -  on statins Zocor     HTN on NOrvasc, Zebeta, Cazaar   chronic CHF diastolic/systolic/ combined - On Bumex  last echo  Recent Results (from the past 81191 hour(s))  ECHOCARDIOGRAM COMPLETE   Collection Time: 03/28/21 10:03 AM  Result Value   Weight 3,001.78   Height 60   BP 157/66   Single Plane A2C EF 57.0   Single Plane A4C EF 59.0   Calc EF 59.0   S' Lateral 2.50   AR max vel 1.70   AV Peak grad 10.6   Ao pk vel 1.63   Area-P 1/2 6.71   Narrative      ECHOCARDIOGRAM REPORT      IMPRESSIONS    1. Left ventricular ejection fraction, by estimation, is 60 to 65%. The left ventricle has normal function. The left  ventricle has no regional wall motion abnormalities. There is mild concentric left ventricular hypertrophy. Left ventricular diastolic  parameters are consistent with Grade I diastolic dysfunction (impaired relaxation).  2. Right ventricular systolic function is normal. The right ventricular size is normal. There is moderately elevated pulmonary artery systolic pressure.  3. The mitral valve is normal in structure. Trivial mitral valve regurgitation. No evidence of mitral stenosis.  4. Tricuspid valve regurgitation is mild to moderate.  5. The aortic valve is tricuspid. Aortic valve regurgitation is not visualized. No aortic stenosis is present.  6. The inferior vena cava is normal in size with greater than 50% respiratory variability, suggesting right atrial pressure of 3 mmHg.  Comparison(s): No prior Echocardiogram.  Conclusion(s)/Recommendation(s): Normal biventricular function without evidence of hemodynamically significant valvular heart disease.            DM 2 -  Lab Results  Component Value Date   HGBA1C 8.5 (H) 03/25/2021   on insulin    Asthma -well  controlled on home inhalers/ nebs                 CKD stage IV  baseline Cr 2.5 Estimated Creatinine Clearance: 11.1 mL/min (A) (by C-G formula based on SCr of 3.84 mg/dL (H)).  Lab  Results  Component Value Date   CREATININE 3.84 (H) 02/19/2023   CREATININE 3.73 (H) 02/18/2023   CREATININE 2.55 (H) 01/02/2023   Lab Results  Component Value Date   NA 139 02/19/2023   CL 104 02/19/2023   K 4.1 02/19/2023   CO2 26 02/19/2023   BUN 50 (H) 02/19/2023   CREATININE 3.84 (H) 02/19/2023   GFRNONAA 11 (L) 02/19/2023   CALCIUM 9.0 02/19/2023   PHOS 4.4 08/24/2016   ALBUMIN 3.1 (L) 02/19/2023   GLUCOSE 142 (H) 02/19/2023      Chronic anemia - baseline hg Hemoglobin & Hematocrit  Recent Labs    02/18/23 1600 02/19/23 1651  HGB 8.9* 9.0*   Iron/TIBC/Ferritin/ %Sat    Component Value Date/Time   IRON 23 (L)  08/27/2016 1039   TIBC 269 08/27/2016 1039   FERRITIN 54 08/27/2016 1039   IRONPCTSAT 9 (L) 08/27/2016 1039      While in ER:    Started on VAnc Plain imaging  no acute fracture or osteomyelitis.  Soft tissue swelling   Hand Surgery Bradly Bienenstock MD consulted  Presumed prior amputation of the distal margin of fourth digit with severe soft tissue swelling and soft tissue defect along the fourth digit margin.   Erosive changes of the distal phalanges of third and fifth digits. Please correlate for etiology. Further workup as clinically directed such as MRI or bone scan  Lab Orders         Aerobic Culture w Gram Stain (superficial specimen)         CBC with Differential         Comprehensive metabolic panel       Following Medications were ordered in ER: Medications  vancomycin (VANCOCIN) IVPB 1000 mg/200 mL premix (0 mg Intravenous Stopped 02/19/23 1640)  vancomycin (VANCOCIN) IVPB 1000 mg/200 mL premix (0 mg Intravenous Stopped 02/19/23 1808)    _______________________________________________________ ER Provider Called:    Hand Surgery   Dr. Bradly Bienenstock They Recommend admit to medicine   Will see in AM      ED Triage Vitals  Encounter Vitals Group     BP 02/19/23 1340 127/62     Systolic BP Percentile --      Diastolic BP Percentile --      Pulse Rate 02/19/23 1340 80     Resp 02/19/23 1340 20     Temp 02/19/23 1340 97.7 F (36.5 C)     Temp src --      SpO2 02/19/23 1340 92 %     Weight 02/19/23 1341 187 lb 6.3 oz (85 kg)     Height 02/19/23 1341 5' (1.524 m)     Head Circumference --      Peak Flow --      Pain Score 02/19/23 1341 0     Pain Loc --      Pain Education --      Exclude from Growth Chart --   ZOXW(96)@     _________________________________________ Significant initial  Findings: Abnormal Labs Reviewed  CBC WITH DIFFERENTIAL/PLATELET - Abnormal; Notable for the following components:      Result Value   WBC 18.7 (*)    RBC 2.90 (*)     Hemoglobin 9.0 (*)    HCT 27.6 (*)    Neutro Abs 15.1 (*)    Monocytes Absolute 1.5 (*)    Abs Immature Granulocytes 0.10 (*)    All other components within normal limits  COMPREHENSIVE METABOLIC PANEL -  Abnormal; Notable for the following components:   Glucose, Bld 142 (*)    BUN 50 (*)    Creatinine, Ser 3.84 (*)    Albumin 3.1 (*)    GFR, Estimated 11 (*)    All other components within normal limits      _________________________ Troponin  Cardiac Panel (last 3 results) Recent Labs    02/19/23 2139  CKTOTAL 576*     ECG: Ordered     The recent clinical data is shown below. Vitals:   02/19/23 1915 02/19/23 1930 02/19/23 1945 02/19/23 1949  BP:  129/71  135/64  Pulse: 88 89 88 89  Resp:      Temp:      SpO2: 92% 91% 94% 95%  Weight:      Height:        WBC     Component Value Date/Time   WBC 18.7 (H) 02/19/2023 1651   LYMPHSABS 1.6 02/19/2023 1651   MONOABS 1.5 (H) 02/19/2023 1651   EOSABS 0.3 02/19/2023 1651   BASOSABS 0.1 02/19/2023 1651     Procalcitonin   Ordered      UA  ordered     ABX started Antibiotics Given (last 72 hours)     Date/Time Action Medication Dose Rate   02/19/23 1535 New Bag/Given   vancomycin (VANCOCIN) IVPB 1000 mg/200 mL premix 1,000 mg 200 mL/hr   02/19/23 1639 New Bag/Given   vancomycin (VANCOCIN) IVPB 1000 mg/200 mL premix 1,000 mg 200 mL/hr      __________________________________________________ Recent Labs  Lab 02/18/23 1600 02/19/23 1651 02/19/23 2139  NA 140 139  --   K 4.0 4.1  --   CO2 22 26  --   GLUCOSE 130* 142*  --   BUN 42* 50*  --   CREATININE 3.73* 3.84*  --   CALCIUM 8.7* 9.0  --   MG  --   --  1.3*  PHOS  --   --  4.3    Cr    Up from baseline see below Lab Results  Component Value Date   CREATININE 3.84 (H) 02/19/2023   CREATININE 3.73 (H) 02/18/2023   CREATININE 2.55 (H) 01/02/2023    Recent Labs  Lab 02/19/23 1651  AST 27  ALT 13  ALKPHOS 68  BILITOT 0.7  PROT 6.6  ALBUMIN  3.1*   Lab Results  Component Value Date   CALCIUM 9.0 02/19/2023   PHOS 4.4 08/24/2016    Plt: Lab Results  Component Value Date   PLT 304 02/19/2023       Recent Labs  Lab 02/18/23 1600 02/19/23 1651  WBC 18.6* 18.7*  NEUTROABS 15.6* 15.1*  HGB 8.9* 9.0*  HCT 28.5* 27.6*  MCV 96.9 95.2  PLT 303 304    HG/HCT stable,      Component Value Date/Time   HGB 9.0 (L) 02/19/2023 1651   HCT 27.6 (L) 02/19/2023 1651   MCV 95.2 02/19/2023 1651    _______________________________________________ Hospitalist was called for admission for   Cellulitis of finger of right hand   The following Work up has been ordered so far:  Orders Placed This Encounter  Procedures   Aerobic Culture w Gram Stain (superficial specimen)   CBC with Differential   Comprehensive metabolic panel   Cardiac Monitoring Continuous x 24 hours Indications for use: Other; other indications for use: Developing osetomyelitis, close observation   Consult to hand surgery   Consult to hospitalist  [phone] Lauren PA  EKG   EKG   Admit to Inpatient (patient's expected length of stay will be greater than 2 midnights or inpatient only procedure)     OTHER Significant initial  Findings:  labs showing:     DM  labs:  HbA1C: No results for input(s): "HGBA1C" in the last 8760 hours.     CBG (last 3)  No results for input(s): "GLUCAP" in the last 72 hours.        Cultures:    Component Value Date/Time   SDES BLOOD LEFT ANTECUBITAL 08/24/2016 1220   SPECREQUEST  08/24/2016 1220    BOTTLES DRAWN AEROBIC AND ANAEROBIC Blood Culture adequate volume   CULT NO GROWTH 6 DAYS 08/24/2016 1220   REPTSTATUS 08/30/2016 FINAL 08/24/2016 1220     Radiological Exams on Admission: DG Finger Middle Right  Result Date: 02/18/2023 CLINICAL DATA:  Possible infection. EXAM: RIGHT MIDDLE FINGER 3V COMPARISON:  None Available. FINDINGS: Soft tissue swelling noted of fourth digit diffusely. There has been previous  amputation of the the distal phalanx of fourth digit. Slight truncation as well of the distal margin of the middle phalanx. Of fourth digit there is no definite erosive changes although there appears to be an open wound along the distal margin of the finger. There are erosive changes however of the visible portions of the distal phalanx of third and fifth digits. Please correlate with history. Inflammatory infectious process would be possible. Global osteopenia. IMPRESSION: Presumed prior amputation of the distal margin of fourth digit with severe soft tissue swelling and soft tissue defect along the fourth digit margin. Erosive changes of the distal phalanges of third and fifth digits. Please correlate for etiology. Further workup as clinically directed such as MRI or bone scan Electronically Signed   By: Karen Kays M.D.   On: 02/18/2023 18:14   _______________________________________________________________________________________________________ Latest  Blood pressure 135/64, pulse 89, temperature 97.7 F (36.5 C), resp. rate 16, height 5' (1.524 m), weight 85 kg, SpO2 95%.   Vitals  labs and radiology finding personally reviewed  Review of Systems:    Pertinent positives include:   nausea,   Constitutional:  No weight loss, night sweats, Fevers, chills, fatigue, weight loss  HEENT:  No headaches, Difficulty swallowing,Tooth/dental problems,Sore throat,  No sneezing, itching, ear ache, nasal congestion, post nasal drip,  Cardio-vascular:  No chest pain, Orthopnea, PND, anasarca, dizziness, palpitations.no Bilateral lower extremity swelling  GI:  No heartburn, indigestion, abdominal pain,vomiting, diarrhea, change in bowel habits, loss of appetite, melena, blood in stool, hematemesis Resp:  no shortness of breath at rest. No dyspnea on exertion, No excess mucus, no productive cough, No non-productive cough, No coughing up of blood.No change in color of mucus.No wheezing. Skin:  no rash or  lesions. No jaundice GU:  no dysuria, change in color of urine, no urgency or frequency. No straining to urinate.  No flank pain.  Musculoskeletal:  No joint pain or no joint swelling. No decreased range of motion. No back pain.  Psych:  No change in mood or affect. No depression or anxiety. No memory loss.  Neuro: no localizing neurological complaints, no tingling, no weakness, no double vision, no gait abnormality, no slurred speech, no confusion  All systems reviewed and apart from HOPI all are negative _______________________________________________________________________________________________ Past Medical History:   Past Medical History:  Diagnosis Date   Asthma    Diabetes mellitus without complication (HCC)    Hypertension       Past Surgical History:  Procedure Laterality  Date   AMPUTATION Left 08/26/2016   Procedure: AMPUTATION RAY/1ST;  Surgeon: Toni Arthurs, MD;  Location: [facility] OR;  Service: Orthopedics;  Laterality: Left;   IR GENERIC HISTORICAL  07/30/2016   IR US GUIDE VASC ACCESS RIGHT 07/30/2016 Richarda Overlie, MD [facility]   IR GENERIC HISTORICAL  07/30/2016   IR FLUORO GUIDE CV MIDLINE PICC RIGHT 07/30/2016 Richarda Overlie, MD [facility]   KNEE ARTHROSCOPY     x 4   ORIF FEMUR FRACTURE Right 03/25/2021   Procedure: OPEN REDUCTION INTERNAL FIXATION (ORIF) DISTAL FEMUR FRACTURE;  Surgeon: Roby Lofts, MD;  Location: [facility] OR;  Service: Orthopedics;  Laterality: Right;   REPLACEMENT TOTAL KNEE      Social History:  Ambulatory   walker      reports that she has never smoked. She has never used smokeless tobacco. She reports that she does not drink alcohol and does not use drugs.   Family History:   Family History  Problem Relation Age of Onset   Heart disease Mother    ______________________________________________________________________________________________ Allergies: Allergies  Allergen Reactions   Sulfa Antibiotics Shortness Of Breath and Rash    Adhesive [Tape] Other (See Comments)    Causes redness, please use paper tape   Keflex [Cephalexin] Other (See Comments)    Possibly caused lethargy (taken with Flagyl)   Lisinopril Cough   Metronidazole Other (See Comments)    Possibly caused lethargy (taken with keflex)   Teflaro [Ceftaroline] Other (See Comments)    Lethargy, possibly rash     Prior to Admission medications   Medication Sig Start Date End Date Taking? Authorizing Provider  allopurinol (ZYLOPRIM) 100 MG tablet Take 100 mg by mouth 2 (two) times daily.     [provider]  amLODipine (NORVASC) 5 MG tablet Take 5 mg by mouth every morning.    [provider]  aspirin EC 81 MG tablet Take 81 mg by mouth at bedtime.    [provider]  bisoprolol (ZEBETA) 10 MG tablet Take 10 mg by mouth in the morning.    [provider]  bumetanide (BUMEX) 1 MG tablet Take 1 tablet (1 mg total) by mouth daily. 01/16/23   Patwardhan, Anabel Bene, MD  calcium carbonate (SUPER CALCIUM) 1500 (600 Ca) MG TABS tablet 1 tablet with meals Orally once a day    [provider]  Calcium Carbonate-Vitamin D (CALCIUM-D) 600-400 MG-UNIT TABS Take 1 tablet by mouth every morning.    [provider]  cholecalciferol (VITAMIN D3) 25 MCG (1000 UNIT) tablet Take 1,000 Units by mouth daily.    [provider]  clobetasol (TEMOVATE) 0.05 % external solution Apply 1 application topically daily as needed (scalp itching/psoriasis).    [provider]  ferrous sulfate (FERROUSUL) 325 (65 FE) MG tablet Take 1 tablet (325 mg total) by mouth 2 (two) times daily with a meal. 09/03/16   Zannie Cove, MD  fluocinonide cream (LIDEX) 0.05 % Apply 1 application topically 2 (two) times daily as needed (psoriasis).     [provider]  gabapentin (NEURONTIN) 300 MG capsule Take 1 capsule (300 mg total) by mouth 2 (two) times daily. Patient taking differently: Take 400 mg by mouth 2 (two) times  daily. 03/28/21   Dahal, Melina Schools, MD  glipiZIDE (GLUCOTROL XL) 5 MG 24 hr tablet Take 5 mg by mouth daily with breakfast.    [provider]  insulin glargine (LANTUS SOLOSTAR) 100 UNIT/ML Solostar Pen Inject 50 Units into the  skin at bedtime. Patient taking differently: Inject 60 Units into the skin at bedtime. 03/28/21   Lorin Glass, MD  loratadine (CLARITIN) 10 MG tablet Take 10 mg by mouth every morning.    [provider]  losartan (COZAAR) 25 MG tablet Take 25 mg by mouth every morning. 02/02/21   [provider]  Multiple Vitamins-Iron (MULTIVITAMIN/IRON PO) Take 1 tablet by mouth every morning.    [provider]  simvastatin (ZOCOR) 5 MG tablet Take 5 mg by mouth at bedtime.     [provider]  sitaGLIPtin (JANUVIA) 50 MG tablet Take 50 mg by mouth every morning.    [provider]  traMADol (ULTRAM) 50 MG tablet Take 50 mg by mouth every 6 (six) hours as needed. 12/24/20   [provider]  triamcinolone ointment (KENALOG) 0.1 % Apply 1 Application topically.    [provider]  vitamin C (ASCORBIC ACID) 500 MG tablet Take 500 mg by mouth every morning.    [provider]  Wound Cleansers (VASHE CLEANSING) SOLN Apply topically. 12/05/22   [provider]    ___________________________________________________________________________________________________ Physical Exam:    02/19/2023    7:49 PM 02/19/2023    7:45 PM 02/19/2023    7:30 PM  Vitals with BMI  Systolic 135  129  Diastolic 64  71  Pulse 89 88 89     1. General:  in No Acute distress Chronically ill -appearing 2. Psychological: Alert and  Oriented 3. Head/ENT:   Moist  Mucous Membranes                          Head Non traumatic, neck supple                           Poor Dentition 4. SKIN:  decreased Skin turgor,  Skin clean Dry 3rd digit of the right hand appears swollen         5. Heart: Regular rate and rhythm  no  Murmur, no Rub or gallop 6. Lungs:  , no wheezes or crackles   7. Abdomen: Soft,  non-tender, Non distended   obese  bowel sounds present 8. Lower extremities: no clubbing, cyanosis, no  edema 9. Neurologically Grossly intact, moving all 4 extremities equally  10. MSK: Normal range of motion    Chart has been reviewed  ______________________________________________________________________________________________  Assessment/Plan  82 y.o. female with medical history significant of DM2, diastolic CHF,  CKD 3, IDA, gout, neuropathy  Admitted for   Cellulitis vs osteomyelitis of finger of right hand     Present on Admission:  Osteomyelitis (HCC)  Hypertension, essential  CKD (chronic kidney disease) stage 3, GFR 30-59 ml/min (HCC)  Chronic heart failure with preserved ejection fraction (HCC)  Hypomagnesemia  Anemia   Osteomyelitis (HCC) Osteomyelitis versus cellulitis over third right digit. MRI of the hand ordered. Cover for tonight with cefepime and vancomycin Appreciate orthopedics hand consult Family at this point states they would like to see if there is a second opinion to see if fingers can be preserved they would like to try to avoid operation of possible  Diabetes mellitus, type II, insulin dependent (HCC)  - Order Sensitive SSI   - continue home insulin but decreased to  40units,  -  check TSH and HgA1C  - Hold by mouth medications    Hypertension, essential Continue home medications Norvasc 5 mg daily Zebeta 10  mg daily  CKD (chronic kidney disease) stage 3, GFR 30-59 ml/min (HCC)  -chronic avoid nephrotoxic medications such as NSAIDs, Vanco Zosyn combo,  avoid hypotension, continue to follow renal function   Chronic heart failure with preserved ejection fraction (HCC) Chronic stable avoid over aggressive fluid resuscitation  Hypomagnesemia Will replace and recheck  Anemia Obtain anemia panel  Transfuse for Hg <7 , rapidly dropping or  if  symptomatic    Other plan as per orders.  DVT prophylaxis:  SCD     Code Status:    Code Status: Prior FULL CODE  as per patient   I had personally discussed CODE STATUS with patient and family   ACP   none   Family Communication:   Family  at  Bedside  plan of care was discussed on the phone with Husband,    Diet  npo   Disposition Plan:    To home once workup is complete and patient is stable   Following barriers for discharge:                          Cellulitis/ osteomyelitis treated                                                          Pain controlled with PO medications                                able to transition to PO antibiotics                                                     Will need consultants to evaluate patient prior to discharge       Consult Orders  (From admission, onward)           Start     Ordered   02/19/23 1515  Consult to hospitalist  [phone]  Leotis Shames PA / Asher Muir at [facility] will have Hospitalist Consult with Dr/PA-Baron. RE-CALLED- Monesha at has re-paged [employer].-ABB(NS)  Once       Comments: [phone] Lauren PA  Provider:  (Not yet assigned)  Question Answer Comment  Place call to: [facility]   Reason for Consult Admit      02/19/23 1515                              Would benefit from PT/OT eval prior to DC  Ordered                                   Diabetes care coordinator                   Transition of care consulted                   Nutrition    consulted  Consults called: Hand surgery Dr. Melvyn Novas is aware but family is requesting second opinion to see if it would be possible to preserve digit Please attempt to page hand surgery  in AM  Admission status:  ED Disposition     ED Disposition  Admit   Condition  --   Comment  Hospital Area: [facility] [[id number]]  Level of Care: Telemetry Medical [[id number]]  May admit patient to [facility] or [facility] if  equivalent level of care is available:: No  Interfacility transfer: Yes  Covid Evaluation: Asymptomatic - no recent exposure (last 10 days) testing not required  Diagnosis: Osteomyelitis [facility]) [[id number]]  Admitting Physician: Synetta Fail [[id number]]  Attending Physician: Synetta Fail [[id number]]  Certification:: I certify this patient will need inpatient services for at least 2 midnights  Expected Medical Readiness: 02/21/2023           inpatient     I Expect 2 midnight stay secondary to severity of patient's current illness need for inpatient interventions justified by the following:  Severe lab/radiological/exam abnormalities including:    Possible osteomyelitis and extensive comorbidities including:  DM2    That are currently affecting medical management.   I expect  patient to be hospitalized for 2 midnights requiring inpatient medical care.  Patient is at high risk for adverse outcome (such as loss of life or disability) if not treated.  Indication for inpatient stay as follows:    severe pain requiring acute inpatient management,    Need for operative/procedural  intervention    Need for IV antibiotics, IV fluids    Level of care     tele  For 12H     Azharia Surratt 02/20/2023, 12:43 AM    [facility]     after 2 AM please page floor coverage PA If 7AM-7PM, please contact the day team taking care of the patient using [email or web address]

## 2023-02-19 NOTE — ED Provider Notes (Signed)
Patient is a 82 year old female presenting with right third finger significant and severe swelling with purulent drainage, the finger is approximately 2-3 times the size of a normal finger with severe swelling extending back onto the hand, there is dorsal redness warmth and swelling of the hand.  There is tenderness.  She does have pretty severe neuropathy of her fingers at baseline.  She does not have a fever or tachycardia, she does have a leukocytosis, I have spoken with Dr. Gilman Schmidt who is willing to be a part of the patient's care but may not be able to get to the case immediately in the next day or 2.  We appreciate his willingness to be part of the patient's inpatient treatment.  She has enough swelling and redness that I think she needs IV antibiotics and admission as this is starting to spread up her arm.  Will discuss with the hospitalist for admission   Eber Hong, MD 02/27/23 [phone]

## 2023-02-19 NOTE — ED Provider Notes (Signed)
[place] EMERGENCY DEPARTMENT AT [facility] Provider Note   CSN: [id number] Arrival date & time: 02/19/23  1244     History  Chief Complaint  Patient presents with   hand swelling    Angela Castro is a 82 y.o. female with past medical history of osteomyelitis, type 2 diabetes (insulin dependent), CKD 3, IDA, previous digit amputations presents emergency department for evaluation of right hand third finger swelling, redness, and purulent drainage over the past three days.  She was seen by wound center yesterday and recommended to go to emergency room for evaluation.  She was seen at [facility] ER where lab work and imaging were obtained but left prior to provider evaluation due to extended wait time.  She denies fever, N/V/D.  HPI     Home Medications Prior to Admission medications   Medication Sig Start Date End Date Taking? Authorizing Provider  allopurinol (ZYLOPRIM) 100 MG tablet Take 100 mg by mouth 2 (two) times daily.     [provider]  amLODipine (NORVASC) 5 MG tablet Take 5 mg by mouth every morning.    [provider]  aspirin EC 81 MG tablet Take 81 mg by mouth at bedtime.    [provider]  bisoprolol (ZEBETA) 10 MG tablet Take 10 mg by mouth in the morning.    [provider]  bumetanide (BUMEX) 1 MG tablet Take 1 tablet (1 mg total) by mouth daily. 01/16/23   Patwardhan, Anabel Bene, MD  calcium carbonate (SUPER CALCIUM) 1500 (600 Ca) MG TABS tablet 1 tablet with meals Orally once a day    [provider]  Calcium Carbonate-Vitamin D (CALCIUM-D) 600-400 MG-UNIT TABS Take 1 tablet by mouth every morning.    [provider]  cholecalciferol (VITAMIN D3) 25 MCG (1000 UNIT) tablet Take 1,000 Units by mouth daily.    [provider]  clobetasol (TEMOVATE) 0.05 % external solution Apply 1 application topically daily as needed (scalp itching/psoriasis).    [provider]  ferrous sulfate (FERROUSUL)  325 (65 FE) MG tablet Take 1 tablet (325 mg total) by mouth 2 (two) times daily with a meal. 09/03/16   Zannie Cove, MD  fluocinonide cream (LIDEX) 0.05 % Apply 1 application topically 2 (two) times daily as needed (psoriasis).     [provider]  gabapentin (NEURONTIN) 300 MG capsule Take 1 capsule (300 mg total) by mouth 2 (two) times daily. Patient taking differently: Take 400 mg by mouth 2 (two) times daily. 03/28/21   Dahal, Melina Schools, MD  glipiZIDE (GLUCOTROL XL) 5 MG 24 hr tablet Take 5 mg by mouth daily with breakfast.    [provider]  insulin glargine (LANTUS SOLOSTAR) 100 UNIT/ML Solostar Pen Inject 50 Units into the skin at bedtime. Patient taking differently: Inject 60 Units into the skin at bedtime. 03/28/21   Lorin Glass, MD  loratadine (CLARITIN) 10 MG tablet Take 10 mg by mouth every morning.    [provider]  losartan (COZAAR) 25 MG tablet Take 25 mg by mouth every morning. 02/02/21   [provider]  Multiple Vitamins-Iron (MULTIVITAMIN/IRON PO) Take 1 tablet by mouth every morning.    [provider]  simvastatin (ZOCOR) 5 MG tablet Take 5 mg by mouth at bedtime.     [provider]  sitaGLIPtin (JANUVIA) 50 MG tablet Take 50 mg by mouth every morning.    [provider]  traMADol (ULTRAM) 50 MG tablet Take 50 mg by mouth every 6 (  six) hours as needed. 12/24/20   [provider]  triamcinolone ointment (KENALOG) 0.1 % Apply 1 Application topically.    [provider]  vitamin C (ASCORBIC ACID) 500 MG tablet Take 500 mg by mouth every morning.    [provider]  Wound Cleansers (VASHE CLEANSING) SOLN Apply topically. 12/05/22   [provider]      Allergies    Sulfa antibiotics, Adhesive [tape], Keflex [cephalexin], Lisinopril, Metronidazole, and Teflaro [ceftaroline]    Review of Systems   Review of Systems  Constitutional:  Negative for chills, fatigue and fever.   Respiratory:  Negative for cough, chest tightness, shortness of breath and wheezing.   Cardiovascular:  Negative for chest pain and palpitations.  Gastrointestinal:  Negative for abdominal pain, constipation, diarrhea, nausea and vomiting.  Musculoskeletal:  Positive for joint swelling.  Skin:  Positive for color change and wound.  Neurological:  Negative for dizziness, seizures, weakness, light-headedness, numbness and headaches.    Physical Exam Updated Vital Signs BP (!) 133/53 (BP Location: Right Arm)   Pulse 86   Temp 97.7 F (36.5 C)   Resp 16   Ht 5' (1.524 m)   Wt 85 kg   SpO2 95%   BMI 36.60 kg/m  Physical Exam Vitals and nursing note reviewed.  Constitutional:      General: She is not in acute distress.    Appearance: Normal appearance. She is not ill-appearing.  HENT:     Head: Normocephalic and atraumatic.  Eyes:     General:        Right eye: No discharge.        Left eye: No discharge.     Conjunctiva/sclera: Conjunctivae normal.  Cardiovascular:     Rate and Rhythm: Normal rate.     Comments: Radial pulses 2+ and equal bilaterally Pulmonary:     Effort: Pulmonary effort is normal. No respiratory distress.  Abdominal:     General: There is no distension.     Palpations: Abdomen is soft.     Tenderness: There is no abdominal tenderness.  Musculoskeletal:     Comments: Difficulty with flexion of third finger of right hand due to swelling  Skin:    Coloration: Skin is not jaundiced or pale.     Comments: Warmth and erythema to right middle digit that extends into palm and dorsal hand Open wound with purulent drainageat distal digit of third digit of right hand  No warmth, erythema, drainage from other digits bilaterally   Neurological:     Mental Status: She is alert. Mental status is at baseline.     Comments: Patient has neuropathy with poor sensation of right hand and fingers at baseline     ED Results / Procedures / Treatments   Labs (all labs  ordered are listed, but only abnormal results are displayed) Labs Reviewed  CBC WITH DIFFERENTIAL/PLATELET - Abnormal; Notable for the following components:      Result Value   WBC 18.7 (*)    RBC 2.90 (*)    Hemoglobin 9.0 (*)    HCT 27.6 (*)    Neutro Abs 15.1 (*)    Monocytes Absolute 1.5 (*)    Abs Immature Granulocytes 0.10 (*)    All other components within normal limits  COMPREHENSIVE METABOLIC PANEL - Abnormal; Notable for the following components:   Glucose, Bld 142 (*)    BUN 50 (*)    Creatinine, Ser 3.84 (*)    Albumin 3.1 (*)  GFR, Estimated 11 (*)    All other components within normal limits  AEROBIC/ANAEROBIC CULTURE W GRAM STAIN (SURGICAL/DEEP WOUND)    EKG None  Radiology DG Finger Middle Right  Result Date: 02/18/2023 CLINICAL DATA:  Possible infection. EXAM: RIGHT MIDDLE FINGER 3V COMPARISON:  None Available. FINDINGS: Soft tissue swelling noted of fourth digit diffusely. There has been previous amputation of the the distal phalanx of fourth digit. Slight truncation as well of the distal margin of the middle phalanx. Of fourth digit there is no definite erosive changes although there appears to be an open wound along the distal margin of the finger. There are erosive changes however of the visible portions of the distal phalanx of third and fifth digits. Please correlate with history. Inflammatory infectious process would be possible. Global osteopenia. IMPRESSION: Presumed prior amputation of the distal margin of fourth digit with severe soft tissue swelling and soft tissue defect along the fourth digit margin. Erosive changes of the distal phalanges of third and fifth digits. Please correlate for etiology. Further workup as clinically directed such as MRI or bone scan Electronically Signed   By: Karen Kays M.D.   On: 02/18/2023 18:14    Procedures Procedures    Medications Ordered in ED Medications  vancomycin (VANCOCIN) IVPB 1000 mg/200 mL premix (0 mg  Intravenous Stopped 02/19/23 1640)  vancomycin (VANCOCIN) IVPB 1000 mg/200 mL premix (1,000 mg Intravenous New Bag/Given 02/19/23 1639)    ED Course/ Medical Decision Making/ A&P                                 Medical Decision Making   Patient presents to the ED for concern of right middle finger swelling, this involves an extensive number of treatment options, and is a complaint that carries with it a high risk of complications and morbidity.  The differential diagnosis includes cellulitis, flexor teno, osteomyelitis   Co morbidities that complicate the patient evaluation  Diabetes type 2 (insulin dependent)   Additional history obtained:  Additional history obtained from Family, Nursing, Outside Medical Records, and Past Admission   External records from outside source obtained upon chart review   Lab Tests:  I Ordered, and personally interpreted labs.  The pertinent results include: Leukocytosis (18.6)   Imaging Studies ordered:  Reviewed imaging obtained at [facility], ER yesterday I independently visualized and interpreted imaging which showed no acute fracture or osteomyelitis.  Soft tissue swelling I agree with the radiologist interpretation   Medicines ordered and prescription drug management:  I ordered medication including Vanc IV  for infection  Reevaluation of the patient after these medicines showed that the patient stayed the same I have reviewed the patients home medicines and have made adjustments as needed   Consultations Obtained:  Dr. Hyacinth Meeker requested consultation with hand surgery Bradly Bienenstock MD),  and discussed lab and imaging findings as well as pertinent plan - he will consult on patient once admitted for further management I requested consultation with hospitalist,  and discussed lab and imaging findings as well as pertinent plan- Dr. Alinda Money accepted patient for admission   Problem List / ED Course:  Cellulitis , right third  digit   Reevaluation:  After the interventions noted above, I reevaluated the patient and found that they have :stayed the same    Dispostion:  Patient is well-appearing.  Vital signs WNL.  See HPI.  On evaluation, patient has significant swelling of  right middle finger with erythema and warmth spreading up into her hand (see media).  She is unable to flex right middle finger secondary to swelling.  Physical exam of right hand is limited due to neuropathy and patient not normally feeling sensation in fingers at baseline.  There is an open wound on the distal portion of the right middle phalanx that is actively draining purulent drainage. There are several old wounds on the distal ends of other fingers bilaterally that do not appear infected. She was seen at [facility] ER yesterday where lab work and x-rays were obtained.  She has leukocytosis at 18.6.  X-ray is not significant for fracture. Erosion to distal phalanges of third and fifth digits is appreciated but unclear whether degenerative or infectious.  After consideration of the diagnostic results and the patients response to treatment, I feel that the patent would benefit from admission and hand surgery consultation for further management.  Will provide IV Vanco in ED. Consulted hand surgery and hospitalist for admission at [facility] (see consult above).  1630 Hospitalist requests new set of labwork while waiting for admission and transfer to [facility]. CBC and CMP ordered.  Dr. Hyacinth Meeker individually assessed patient and agrees with plan.        Final Clinical Impression(s) / ED Diagnoses Final diagnoses:  Cellulitis of finger of right hand    Rx / DC Orders ED Discharge Orders     None         Judithann Sheen, PA 02/19/23 Merrily Brittle    Eber Hong, MD 02/27/23 [phone]

## 2023-02-19 NOTE — Subjective & Objective (Signed)
Presents with a right hand third finger swelling redness was at wound care yesterday and was told to go to emergency department has history of wounds that required an amputation with underlying history of diabetes there is some drainage of a right hand finger Purulent drainage for the past 3 days She was originally seen at [facility], ER but left without being seen by provider Not associated with nausea vomiting or diarrhea

## 2023-02-19 NOTE — ED Provider Notes (Signed)
X-ray showed no osteomyelitis but does have a leukocytosis and a spreading infection from her right middle digit which is severely swollen with a purulent drainage coming out of the tip of the finger and extending up onto the hand.  She is warm to the touch but does not have a fever or tachycardia.  She will get antibiotics, orthopedic hand surgery consultation, she will need to be transferred for medical admission and hand surgery evaluation for debridement.  This is far more than a simple incision and drainage at the bedside in the ER   Eber Hong, MD 02/19/23 1442

## 2023-02-19 NOTE — ED Triage Notes (Signed)
Onset Sunday night right hand third finger swelling and redness.  Seen by wound center yesterday and told to come to ER for evaluation.  Previous wounds and finger amputations due to diabetes. Noted swelling to right hand finger with some drainage.

## 2023-02-19 NOTE — ED Notes (Signed)
[facility] will send transport for Bed Ready at [facility] 5W RM#[id number].-ABB(NS)

## 2023-02-19 NOTE — Assessment & Plan Note (Signed)
-

## 2023-02-19 NOTE — Assessment & Plan Note (Signed)
-   Order Sensitive SSI   - continue home insulin but decreased to  40units,  -  check TSH and HgA1C  - Hold by mouth medications

## 2023-02-19 NOTE — Assessment & Plan Note (Signed)
Continue home medications Norvasc 5 mg daily Zebeta 10 mg daily

## 2023-02-19 NOTE — Assessment & Plan Note (Signed)
Chronic stable avoid over aggressive fluid resuscitation 

## 2023-02-19 NOTE — Assessment & Plan Note (Signed)
Osteomyelitis versus cellulitis over third right digit. MRI of the hand ordered. Cover for tonight with cefepime and vancomycin Appreciate orthopedics hand consult Family at this point states they would like to see if there is a second opinion to see if fingers can be preserved they would like to try to avoid operation of possible

## 2023-02-19 NOTE — ED Notes (Signed)
Report given to the floor RN.

## 2023-02-19 NOTE — Progress Notes (Signed)
Pharmacy Antibiotic Note  Angela Castro is a 82 y.o. female admitted on 02/19/2023 with finger infection.  Pharmacy has been consulted for Vancomycin and Cefepime dosing.  Pt has hx of previous digit amputations.  Presented to ED with redness, swelling, and discharge from R 3rd finger.  SCr elevated 3.84, known CKD with SCr 2.15 Dec 2022.  Patient received Vancomycin 2gm IV loading dose at [facility] ED 10/10 @ 1500.  Plan: Vancomycin 500mg  IV q48h - dose due 10/12 at 1500 (eAUC 522 using SCr 3.84, Vd 0.5) Cefepime 2gm IV q24h Follow-up renal function, culture data, and clinical progress  Height: 5' (152.4 cm) Weight: 85 kg (187 lb 6.3 oz) IBW/kg (Calculated) : 45.5  Temp (24hrs), Avg:98.1 F (36.7 C), Min:97.7 F (36.5 C), Max:98.4 F (36.9 C)  Recent Labs  Lab 02/18/23 1600 02/19/23 1651  WBC 18.6* 18.7*  CREATININE 3.73* 3.84*    Estimated Creatinine Clearance: 11.1 mL/min (A) (by C-G formula based on SCr of 3.84 mg/dL (H)).    Allergies  Allergen Reactions   Sulfa Antibiotics Shortness Of Breath and Rash   Adhesive [Tape] Other (See Comments)    Causes redness, please use paper tape   Keflex [Cephalexin] Other (See Comments)    Possibly caused lethargy (taken with Flagyl)   Lisinopril Cough   Metronidazole Other (See Comments)    Possibly caused lethargy (taken with keflex)   Teflaro [Ceftaroline] Other (See Comments)    Lethargy, possibly rash    Antimicrobials this admission: Vanc 10/10 >>  Cefepime 10/10 >>   Dose adjustments this admission:   Microbiology results: 10/10 wound cx: pending   Thank you for allowing pharmacy to be a part of this patient's care.  Brandon Melnick 02/19/2023 9:59 PM

## 2023-02-20 ENCOUNTER — Encounter (HOSPITAL_COMMUNITY)
Admission: EM | Disposition: A | Discharge status: Home / Self Care | Payer: Self-pay | Source: Home / Self Care | Attending: Internal Medicine

## 2023-02-20 ENCOUNTER — Other Ambulatory Visit: Payer: Self-pay

## 2023-02-20 ENCOUNTER — Inpatient Hospital Stay (HOSPITAL_COMMUNITY): Payer: Medicare PPO

## 2023-02-20 ENCOUNTER — Encounter (HOSPITAL_COMMUNITY): Payer: Self-pay | Admitting: Internal Medicine

## 2023-02-20 DIAGNOSIS — M86141 Other acute osteomyelitis, right hand: Secondary | ICD-10-CM | POA: Diagnosis not present

## 2023-02-20 DIAGNOSIS — L98496 Non-pressure chronic ulcer of skin of other sites with bone involvement without evidence of necrosis: Secondary | ICD-10-CM | POA: Diagnosis not present

## 2023-02-20 DIAGNOSIS — D649 Anemia, unspecified: Secondary | ICD-10-CM | POA: Diagnosis present

## 2023-02-20 HISTORY — PX: I & D EXTREMITY: SHX5045

## 2023-02-20 LAB — CBC WITH DIFFERENTIAL/PLATELET
Abs Immature Granulocytes: 0.07 10*3/uL (ref 0.00–0.07)
Basophils Absolute: 0.1 10*3/uL (ref 0.0–0.1)
Basophils Relative: 0 %
Eosinophils Absolute: 0.4 10*3/uL (ref 0.0–0.5)
Eosinophils Relative: 3 %
HCT: 24.1 % — ABNORMAL LOW (ref 36.0–46.0)
Hemoglobin: 7.7 g/dL — ABNORMAL LOW (ref 12.0–15.0)
Immature Granulocytes: 0 %
Lymphocytes Relative: 6 %
Lymphs Abs: 1.1 10*3/uL (ref 0.7–4.0)
MCH: 30.1 pg (ref 26.0–34.0)
MCHC: 32 g/dL (ref 30.0–36.0)
MCV: 94.1 fL (ref 80.0–100.0)
Monocytes Absolute: 1.2 10*3/uL — ABNORMAL HIGH (ref 0.1–1.0)
Monocytes Relative: 7 %
Neutro Abs: 13.7 10*3/uL — ABNORMAL HIGH (ref 1.7–7.7)
Neutrophils Relative %: 84 %
Platelets: 291 10*3/uL (ref 150–400)
RBC: 2.56 MIL/uL — ABNORMAL LOW (ref 3.87–5.11)
RDW: 13.8 % (ref 11.5–15.5)
WBC: 16.5 10*3/uL — ABNORMAL HIGH (ref 4.0–10.5)
nRBC: 0 % (ref 0.0–0.2)

## 2023-02-20 LAB — GLUCOSE, CAPILLARY
Glucose-Capillary: 130 mg/dL — ABNORMAL HIGH (ref 70–99)
Glucose-Capillary: 140 mg/dL — ABNORMAL HIGH (ref 70–99)
Glucose-Capillary: 156 mg/dL — ABNORMAL HIGH (ref 70–99)
Glucose-Capillary: 170 mg/dL — ABNORMAL HIGH (ref 70–99)
Glucose-Capillary: 74 mg/dL (ref 70–99)
Glucose-Capillary: 85 mg/dL (ref 70–99)
Glucose-Capillary: 88 mg/dL (ref 70–99)
Glucose-Capillary: 93 mg/dL (ref 70–99)

## 2023-02-20 LAB — COMPREHENSIVE METABOLIC PANEL
ALT: 19 U/L (ref 0–44)
AST: 29 U/L (ref 15–41)
Albumin: 1.9 g/dL — ABNORMAL LOW (ref 3.5–5.0)
Alkaline Phosphatase: 60 U/L (ref 38–126)
Anion gap: 16 — ABNORMAL HIGH (ref 5–15)
BUN: 49 mg/dL — ABNORMAL HIGH (ref 8–23)
CO2: 21 mmol/L — ABNORMAL LOW (ref 22–32)
Calcium: 8.4 mg/dL — ABNORMAL LOW (ref 8.9–10.3)
Chloride: 103 mmol/L (ref 98–111)
Creatinine, Ser: 3.82 mg/dL — ABNORMAL HIGH (ref 0.44–1.00)
GFR, Estimated: 11 mL/min — ABNORMAL LOW (ref >60)
Glucose, Bld: 157 mg/dL — ABNORMAL HIGH (ref 70–99)
Potassium: 4.1 mmol/L (ref 3.5–5.1)
Sodium: 140 mmol/L (ref 135–145)
Total Bilirubin: 0.8 mg/dL (ref 0.3–1.2)
Total Protein: 5.3 g/dL — ABNORMAL LOW (ref 6.5–8.1)

## 2023-02-20 LAB — OSMOLALITY, URINE: Osmolality, Ur: 354 mosm/kg (ref 300–900)

## 2023-02-20 LAB — PHOSPHORUS: Phosphorus: 4.3 mg/dL (ref 2.5–4.6)

## 2023-02-20 LAB — LACTIC ACID, PLASMA
Lactic Acid, Venous: 0.9 mmol/L (ref 0.5–1.9)
Lactic Acid, Venous: 0.8 mmol/L (ref 0.5–1.9)

## 2023-02-20 LAB — TYPE AND SCREEN
ABO/RH(D): O POS
Antibody Screen: NEGATIVE

## 2023-02-20 LAB — CK: Total CK: 387 U/L — ABNORMAL HIGH (ref 38–234)

## 2023-02-20 LAB — PROTIME-INR
INR: 1.5 — ABNORMAL HIGH (ref 0.8–1.2)
Prothrombin Time: 18.1 s — ABNORMAL HIGH (ref 11.4–15.2)

## 2023-02-20 LAB — MRSA NEXT GEN BY PCR, NASAL: MRSA by PCR Next Gen: NOT DETECTED

## 2023-02-20 LAB — APTT: aPTT: 41 s — ABNORMAL HIGH (ref 24–36)

## 2023-02-20 LAB — MAGNESIUM: Magnesium: 2 mg/dL (ref 1.7–2.4)

## 2023-02-20 SURGERY — IRRIGATION AND DEBRIDEMENT EXTREMITY
Anesthesia: General | Site: Middle Finger | Laterality: Right

## 2023-02-20 MED ORDER — DEXAMETHASONE SODIUM PHOSPHATE 10 MG/ML IJ SOLN
INTRAMUSCULAR | Status: DC | PRN
Start: 2023-02-20 — End: 2023-02-20
  Administered 2023-02-20: 10 mg

## 2023-02-20 MED ORDER — INSULIN ASPART 100 UNIT/ML IJ SOLN: 0.0000 [IU] | INTRAMUSCULAR | Status: DC | PRN

## 2023-02-20 MED ORDER — PHENYLEPHRINE 80 MCG/ML (10ML) SYRINGE FOR IV PUSH (FOR BLOOD PRESSURE SUPPORT)
PREFILLED_SYRINGE | INTRAVENOUS | Status: DC | PRN
Start: 2023-02-20 — End: 2023-02-20
  Administered 2023-02-20: 80 ug via INTRAVENOUS

## 2023-02-20 MED ORDER — INSULIN GLARGINE-YFGN 100 UNIT/ML ~~LOC~~ SOLN
15.0000 [IU] | Freq: Every day | SUBCUTANEOUS | Status: DC
  Filled 2023-02-20: qty 0.15

## 2023-02-20 MED ORDER — ORAL CARE MOUTH RINSE: 15.0000 mL | Freq: Once | OROMUCOSAL | Status: AC

## 2023-02-20 MED ORDER — ADULT MULTIVITAMIN W/MINERALS CH
1.0000 | ORAL_TABLET | Freq: Every day | ORAL | Status: DC
  Administered 2023-02-21 – 2023-02-25 (×5): 1 via ORAL
  Filled 2023-02-20 (×5): qty 1

## 2023-02-20 MED ORDER — CHLORHEXIDINE GLUCONATE 0.12 % MT SOLN
OROMUCOSAL | Status: AC
  Administered 2023-02-20: 15 mL via OROMUCOSAL
  Filled 2023-02-20: qty 15

## 2023-02-20 MED ORDER — DEXTROSE 50 % IV SOLN
25.0000 mL | Freq: Once | INTRAVENOUS | Status: AC
  Administered 2023-02-20: 25 mL via INTRAVENOUS

## 2023-02-20 MED ORDER — FENTANYL CITRATE (PF) 100 MCG/2ML IJ SOLN
INTRAMUSCULAR | Status: AC
  Administered 2023-02-20: 50 ug via INTRAVENOUS
  Filled 2023-02-20: qty 2

## 2023-02-20 MED ORDER — KCL IN DEXTROSE-NACL 10-5-0.45 MEQ/L-%-% IV SOLN
INTRAVENOUS | Status: DC
  Filled 2023-02-20 (×2): qty 1000

## 2023-02-20 MED ORDER — CHLORHEXIDINE GLUCONATE 0.12 % MT SOLN: 15.0000 mL | Freq: Once | OROMUCOSAL | Status: AC

## 2023-02-20 MED ORDER — ROPIVACAINE HCL 5 MG/ML IJ SOLN
INTRAMUSCULAR | Status: DC | PRN
Start: 2023-02-20 — End: 2023-02-20
  Administered 2023-02-20: 20 mL via PERINEURAL

## 2023-02-20 MED ORDER — MAGNESIUM SULFATE 2 GM/50ML IV SOLN
2.0000 g | Freq: Once | INTRAVENOUS | Status: AC
  Administered 2023-02-20: 2 g via INTRAVENOUS
  Filled 2023-02-20: qty 50

## 2023-02-20 MED ORDER — SODIUM CHLORIDE 0.9 % IR SOLN
Status: DC | PRN
  Administered 2023-02-20: 3000 mL

## 2023-02-20 MED ORDER — SODIUM CHLORIDE 0.9 % IV SOLN: INTRAVENOUS | Status: DC

## 2023-02-20 MED ORDER — MIDAZOLAM HCL 2 MG/2ML IJ SOLN
INTRAMUSCULAR | Status: AC
  Filled 2023-02-20: qty 2

## 2023-02-20 MED ORDER — LINEZOLID 600 MG PO TABS
600.0000 mg | ORAL_TABLET | Freq: Two times a day (BID) | ORAL | Status: DC
  Administered 2023-02-21 – 2023-02-22 (×4): 600 mg via ORAL
  Filled 2023-02-20 (×4): qty 1

## 2023-02-20 MED ORDER — JUVEN PO PACK
1.0000 | PACK | Freq: Two times a day (BID) | ORAL | Status: DC
  Administered 2023-02-21 – 2023-02-24 (×7): 1 via ORAL
  Filled 2023-02-20 (×8): qty 1

## 2023-02-20 MED ORDER — FENTANYL CITRATE (PF) 100 MCG/2ML IJ SOLN: 50.0000 ug | Freq: Once | INTRAMUSCULAR | Status: AC

## 2023-02-20 MED ORDER — PROPOFOL 500 MG/50ML IV EMUL
INTRAVENOUS | Status: DC | PRN
Start: 2023-02-20 — End: 2023-02-20
  Administered 2023-02-20: 50 ug/kg/min via INTRAVENOUS

## 2023-02-20 MED ORDER — PROPOFOL 10 MG/ML IV BOLUS
INTRAVENOUS | Status: DC | PRN
  Administered 2023-02-20 (×2): 10 mg via INTRAVENOUS

## 2023-02-20 MED ORDER — INSULIN GLARGINE-YFGN 100 UNIT/ML ~~LOC~~ SOLN
25.0000 [IU] | Freq: Every day | SUBCUTANEOUS | Status: DC
  Filled 2023-02-20: qty 0.25

## 2023-02-20 MED ORDER — LACTATED RINGERS IV SOLN
150.0000 mL/h | INTRAVENOUS | Status: DC
  Administered 2023-02-20: 150 mL/h via INTRAVENOUS

## 2023-02-20 MED ORDER — DEXTROSE 50 % IV SOLN
INTRAVENOUS | Status: AC
  Filled 2023-02-20: qty 50

## 2023-02-20 MED ORDER — SODIUM CHLORIDE 0.9 % IV SOLN
INTRAVENOUS | Status: DC | PRN
Start: 2023-02-20 — End: 2023-02-20

## 2023-02-20 SURGICAL SUPPLY — 55 items
BAG COUNTER SPONGE SURGICOUNT (BAG) ×1 IMPLANT
BAG SPNG CNTER NS LX DISP (BAG) ×1
BNDG CMPR 5X3 KNIT ELC UNQ LF (GAUZE/BANDAGES/DRESSINGS) ×1
BNDG CMPR 5X4 KNIT ELC UNQ LF (GAUZE/BANDAGES/DRESSINGS) ×1
BNDG CMPR 75X21 PLY HI ABS (MISCELLANEOUS)
BNDG CMPR 9X4 STRL LF SNTH (GAUZE/BANDAGES/DRESSINGS)
BNDG CMPR MED 10X6 ELC LF (GAUZE/BANDAGES/DRESSINGS) ×1
BNDG ELASTIC 3INX 5YD STR LF (GAUZE/BANDAGES/DRESSINGS) ×1 IMPLANT
BNDG ELASTIC 4INX 5YD STR LF (GAUZE/BANDAGES/DRESSINGS) IMPLANT
BNDG ELASTIC 4X5.8 VLCR STR LF (GAUZE/BANDAGES/DRESSINGS) ×1 IMPLANT
BNDG ELASTIC 6X10 VLCR STRL LF (GAUZE/BANDAGES/DRESSINGS) IMPLANT
BNDG ESMARK 4X9 LF (GAUZE/BANDAGES/DRESSINGS) IMPLANT
BNDG GAUZE DERMACEA FLUFF 4 (GAUZE/BANDAGES/DRESSINGS) ×2 IMPLANT
BNDG GZE DERMACEA 4 6PLY (GAUZE/BANDAGES/DRESSINGS) ×1
CORD BIPOLAR FORCEPS 12FT (ELECTRODE) IMPLANT
COVER SURGICAL LIGHT HANDLE (MISCELLANEOUS) ×2 IMPLANT
CUFF TOURN SGL QUICK 18X4 (TOURNIQUET CUFF) ×1 IMPLANT
DRAPE SURG 17X23 STRL (DRAPES) ×1 IMPLANT
DRSG ADAPTIC 3X8 NADH LF (GAUZE/BANDAGES/DRESSINGS) IMPLANT
DRSG VAC GRANUFOAM SM (GAUZE/BANDAGES/DRESSINGS) IMPLANT
DURAPREP 26ML APPLICATOR (WOUND CARE) ×1 IMPLANT
GAUZE PACKING IODOFORM 1/4X15 (PACKING) IMPLANT
GAUZE SPONGE 4X4 12PLY STRL (GAUZE/BANDAGES/DRESSINGS) ×2 IMPLANT
GAUZE STRETCH 2X75IN STRL (MISCELLANEOUS) IMPLANT
GAUZE XEROFORM 1X8 LF (GAUZE/BANDAGES/DRESSINGS) ×1 IMPLANT
GLOVE SURG SYN 8.0 (GLOVE) ×1 IMPLANT
GLOVE SURG SYN 8.0 PF PI (GLOVE) ×1 IMPLANT
GOWN STRL REUS W/ TWL LRG LVL3 (GOWN DISPOSABLE) ×1 IMPLANT
GOWN STRL REUS W/ TWL XL LVL3 (GOWN DISPOSABLE) ×1 IMPLANT
GOWN STRL REUS W/TWL LRG LVL3 (GOWN DISPOSABLE) ×1
GOWN STRL REUS W/TWL XL LVL3 (GOWN DISPOSABLE) ×1
KIT BASIN OR (CUSTOM PROCEDURE TRAY) ×1 IMPLANT
KIT TURNOVER KIT B (KITS) ×1 IMPLANT
MANIFOLD NEPTUNE II (INSTRUMENTS) ×1 IMPLANT
NDL HYPO 25GX1X1/2 BEV (NEEDLE) IMPLANT
NDL HYPO 25X1 1.5 SAFETY (NEEDLE) ×1 IMPLANT
NEEDLE HYPO 25GX1X1/2 BEV (NEEDLE) IMPLANT
NEEDLE HYPO 25X1 1.5 SAFETY (NEEDLE) ×1 IMPLANT
NS IRRIG 1000ML POUR BTL (IV SOLUTION) ×1 IMPLANT
PACK ORTHO EXTREMITY (CUSTOM PROCEDURE TRAY) ×1 IMPLANT
PAD ARMBOARD 7.5X6 YLW CONV (MISCELLANEOUS) ×2 IMPLANT
PAD CAST 4YDX4 CTTN HI CHSV (CAST SUPPLIES) ×2 IMPLANT
PADDING CAST COTTON 4X4 STRL (CAST SUPPLIES) ×2
SPIKE FLUID TRANSFER (MISCELLANEOUS) ×1 IMPLANT
SPONGE T-LAP 18X18 ~~LOC~~+RFID (SPONGE) ×1 IMPLANT
SUT ETHILON 5 0 PS 2 18 (SUTURE) IMPLANT
SUT VICRYL RAPIDE 4/0 PS 2 (SUTURE) IMPLANT
SWAB COLLECTION DEVICE MRSA (MISCELLANEOUS) ×1 IMPLANT
SWAB CULTURE ESWAB REG 1ML (MISCELLANEOUS) IMPLANT
SYR CONTROL 10ML LL (SYRINGE) ×1 IMPLANT
TOWEL GREEN STERILE (TOWEL DISPOSABLE) ×1 IMPLANT
TOWEL GREEN STERILE FF (TOWEL DISPOSABLE) ×1 IMPLANT
TUBE CONNECTING 12X1/4 (SUCTIONS) ×1 IMPLANT
UNDERPAD 30X36 HEAVY ABSORB (UNDERPADS AND DIAPERS) ×1 IMPLANT
YANKAUER SUCT BULB TIP NO VENT (SUCTIONS) ×1 IMPLANT

## 2023-02-20 NOTE — Progress Notes (Signed)
Patient spiked a fever of 101.3 F.Blood culture ordered as per order,Dr.Doutova made aware.Escalated V/S to every 1 hr.Patient updated on her NPO status.Denies any pain.Will continue to monitor.

## 2023-02-20 NOTE — Assessment & Plan Note (Signed)
 Obtain anemia panel  Transfuse for Hg <7 , rapidly dropping or  if symptomatic

## 2023-02-20 NOTE — Anesthesia Preprocedure Evaluation (Addendum)
Anesthesia Evaluation  Patient identified by MRN, date of birth, ID band Patient awake    Reviewed: Allergy & Precautions, H&P , Patient's Chart, lab work & pertinent test results  Airway Mallampati: II  TM Distance: >3 FB Neck ROM: Full    Dental no notable dental hx.    Pulmonary neg pulmonary ROS, asthma    Pulmonary exam normal        Cardiovascular Exercise Tolerance: Good hypertension,  Rhythm:Regular Rate:Normal  Echocardiogram 01/02/2023: Left ventricle cavity is normal in size. Mild concentric hypertrophy of the left ventricle. Normal global wall motion. Normal LV systolic function with EF 71%. Doppler evidence of grade I (impaired) diastolic dysfunction, normal LAP. Mild (Grade I) mitral regurgitation. Mild tricuspid regurgitation. Estimated pulmonary artery systolic pressure 42 mmHg.     Neuro/Psych negative neurological ROS  negative psych ROS   GI/Hepatic negative GI ROS, Neg liver ROS,,,  Endo/Other  diabetes    Renal/GU CRFRenal diseasenegative Renal ROS  negative genitourinary   Musculoskeletal  (+) Arthritis ,    Abdominal Normal abdominal exam  (+)   Peds  Hematology negative hematology ROS (+) Blood dyscrasia, anemia   Anesthesia Other Findings   Reproductive/Obstetrics negative OB ROS                             Anesthesia Physical Anesthesia Plan  ASA: 3  Anesthesia Plan: General   Post-op Pain Management: Minimal or no pain anticipated   Induction: Intravenous  PONV Risk Score and Plan: 3 and Ondansetron, Dexamethasone and Treatment may vary due to age or medical condition  Airway Management Planned: LMA and Mask  Additional Equipment: None  Intra-op Plan:   Post-operative Plan: Extubation in OR  Informed Consent: I have reviewed the patients History and Physical, chart, labs and discussed the procedure including the risks, benefits and  alternatives for the proposed anesthesia with the patient or authorized representative who has indicated his/her understanding and acceptance.       Plan Discussed with: Anesthesiologist and CRNA  Anesthesia Plan Comments:        Anesthesia Quick Evaluation

## 2023-02-20 NOTE — Op Note (Signed)
NAME: Angela Castro MEDICAL RECORD NO: [id number] DATE OF BIRTH: 1940-09-09 FACILITY: [facility] LOCATION: [facility] OR PHYSICIAN: Ramon Dredge MD   OPERATIVE REPORT   DATE OF PROCEDURE: 02/20/23    PREOPERATIVE DIAGNOSIS:  right long finger chronic ulceration with exposed bone, concern for flexor tenosynovitis    POSTOPERATIVE DIAGNOSIS:   right long finger chronic ulceration with exposed bone, with flexor tenosynovitis    PROCEDURE:   Right long finger partial amputation I&D of flexor tendon sheath and deep spaces of palm Open carpal tunnel release   SURGEON: Edsel Petrin. Faatima Tench, M.D.   ASSISTANT: none   ANESTHESIA:  Regional with sedation   INTRAVENOUS FLUIDS:  Per anesthesia flow sheet.   ESTIMATED BLOOD LOSS:  Minimal.   COMPLICATIONS:  None.   SPECIMENS:  Culture of finger, Culture of palm, distal finger tip for path, proximal phalanx for culture   TOURNIQUET TIME:    Total Tourniquet Time Documented: Forearm (Right) - 58 minutes Total: Forearm (Right) - 58 minutes    DISPOSITION:  Stable to PACU.   INDICATIONS:  Angela Castro with infected right long finger stump since Sunday in the setting of chronic finger ulcerations with exposed bone of the right long finger.  OPERATIVE COURSE:   Patient was identified in holding.  Site, side, and surgery were confirmed.  Informed consent was obtained.  A regional nerve block was placed by anesthesia.  The patient was brought to the operating room and transferred to the OR table with the right upper extremity outstretched on a hand table.  A well-padded tourniquet was placed on the forearm.  The anesthesia team administered sedation.  The patient was prepped and draped in typical sterile fashion.  A timeout was performed.  The hand was held up for gravity exsanguination and Esmarch placed at the wrist and wrapped proximally.  During manipulation of the hand purulence was easily expressed from the open ulcer on  the right long fingertip.  Cultures were taken.  The tourniquet was inflated to 250 mmHg.  A Bruner type incision was made in the palm of the hand.  Purulent material was again encountered just under the skin surface of the palm.  Additional cultures were obtained.  The soft tissues were bluntly dissected down to the flexor tendon sheath.  The tendon sheath was opened and there was purulent material present.  The incision was carried proximally to ensure that no purulent material tracked into the carpal tunnel causing acute carpal tunnel syndrome as the patient would not be able to tell based on her underlying chronic peripheral neuropathy.  The carpal tunnel was released and although there was some increased edema present there was no purulent material in the carpal tunnel.  The long finger flexor tendons had significant tenosynovitis and purulence around them.  They were found unattached at the distal end within the palm.  The FDS tendon specifically had significant infected appearance and therefore was sharply excised in the palm.  Additionally the thenar eminence and hypothenar eminence were bluntly dissected underneath the skin to ensure there is no further purulence present.  Next attention was turned distally the long finger had previously had a amputation at approximately the level of the DIP joint and chronic ulceration of the middle phalanx.  Because of this and the necrotic and clearly infected skin at the distal tip the decision was made for partial amputation of the long finger.  A fishmouth shaped incision was made along the proximal phalanx to only involve  relatively healthy appearing skin.  Bone from the proximal phalanx was removed sharply with rongeurs and sent for culture.  The bone was removed down to the level to be able to be closed by the skin.   The wound was then thoroughly irrigated with 3 L of normal saline.  Fluid was specifically flushed in an anterograde fashion through the flexor  tendon sheath as well as the rest of the wound.  3-0 Prolene was then used to suture the residual FDS to the FDP tendon.  The FDP tendon was then brought back out through the distal end and attached to the residual volar plate.  This was performed to hopefully give the patient some function to be able to flex the stump of her right long finger.  The tourniquet was deflated.  Hemostasis was achieved with bipolar electrocautery.  The long finger amputation site was closed with 5-0 nylons.  The palmar incision was closed with 4-0 nylon's.  The wounds were then dressed with Xeroform, gauze, Kerlix, and an Ace bandage.  Postop plan: Patient will remain admitted and on broad-spectrum IV antibiotics until cultures result.  Patient should remain nonweightbearing on the right upper extremity.  Depending on clinical course patient will potentially need another irrigation and debridement during this hospital stay.  Please obtain daily CRP and CBC values to monitor for infection resolution.  Ramon Dredge, MD Electronically signed, 02/20/23

## 2023-02-20 NOTE — Progress Notes (Signed)
Initial Nutrition Assessment  DOCUMENTATION CODES:   Obesity unspecified  INTERVENTION:   -RD will follow for diet advancement  -Once diet is advanced, add:   -1 packet Juven BID, each packet provides 95 calories, 2.5 grams of protein (collagen), and 9.8 grams of carbohydrate (3 grams sugar); also contains 7 grams of L-arginine and L-glutamine, 300 mg vitamin C, 15 mg vitamin E, 1.2 mcg vitamin B-12, 9.5 mg zinc, 200 mg calcium, and 1.5 g  Calcium Beta-hydroxy-Beta-methylbutyrate to support wound healing  -MVI with minerals daily -Magic cup BID with meals, each supplement provides 290 kcal and 9 grams of protein   NUTRITION DIAGNOSIS:   Increased nutrient needs related to wound healing as evidenced by estimated needs.  GOAL:   Patient will meet greater than or equal to 90% of their needs  MONITOR:   PO intake, Supplement acceptance, Diet advancement  REASON FOR ASSESSMENT:   Consult Assessment of nutrition requirement/status, Wound healing  ASSESSMENT:   Pt with medical history significant of DM2, diastolic CHF,  CKD 3, IDA, gout, neuropathy presented with  right hand 3rd finger swelling and redness.  Pt admitted with osteomyelitis versus cellulitis over third right digit.   Reviewed I/O's: +400 ml x 24 hours   Pt unavailable at time of visit. Attempted to speak with pt via call to hospital room phone, however, unable to reach. RD unable to obtain further nutrition-related history or complete nutrition-focused physical exam at this time.    Per MD notes, pt noticed swelling on rt middle finger 5 days PTA. Pt with minimal sensation in fingers secondary to neuropathy. Plan for rt upper extremity arterial duplex and hand surgery consult.   Pt currently NPO for potential procedures.   Reviewed wt hx; wt has been stable over the past month.   Medications reviewed and include neurontin and 0.9% sodium chloride infusion @ 75 ml/hr.   Lab Results  Component Value Date    HGBA1C 7.3 (H) 02/19/2023   PTA DM medications are 5 mg glipizide daily and 50 mg [place] daily. Per ADA's Standards of Medical Care for Diabetes, glycemic targets for elderly patients who are otherwise healthy (few medical impairments) and cognitively intact should be less stringent (Hgb A1c <7.5).    Labs reviewed: CBGS: 88-156 (inpatient orders for glycemic control are 0-9 units insulin aspart every 4 hours and 40 units insulin glargine-yfgn daily).    Diet Order:   Diet Order             Diet NPO time specified Except for: Ice Chips, Sips with Meds  Diet effective now                   EDUCATION NEEDS:   No education needs have been identified at this time  Skin:  Skin Assessment: Skin Integrity Issues: Skin Integrity Issues:: Other (Comment) Other: lt mid finger swelling  Last BM:  Unknown  Height:   Ht Readings from Last 1 Encounters:  02/19/23 5' (1.524 m)    Weight:   Wt Readings from Last 1 Encounters:  02/19/23 85 kg    Ideal Body Weight:  45.5 kg  BMI:  Body mass index is 36.6 kg/m.  Estimated Nutritional Needs:   Kcal:  1600-1800  Protein:  80-95 grams  Fluid:  > 1.6 L    Levada Schilling, RD, LDN, CDCES Registered Dietitian III Certified Diabetes Care and Education Specialist Please refer to [facility] for RD and/or RD on-call/weekend/after hours pager

## 2023-02-20 NOTE — Anesthesia Procedure Notes (Signed)
Anesthesia Regional Block: Axillary brachial plexus block   Pre-Anesthetic Checklist: , timeout performed,  Correct Patient, Correct Site, Correct Laterality,  Correct Procedure, Correct Position, site marked,  Risks and benefits discussed,  Surgical consent,  Pre-op evaluation,  At surgeon's request and post-op pain management  Laterality: Right  Prep: Dura Prep       Needles:  Injection technique: Single-shot  Needle Type: Echogenic Stimulator Needle     Needle Length: 5cm  Needle Gauge: 20     Additional Needles:   Procedures:,,,, ultrasound used (permanent image in chart),,    Narrative:  Start time: 02/20/2023 7:00 PM End time: 02/20/2023 7:05 PM Injection made incrementally with aspirations every 5 mL.  Performed by: Personally  Anesthesiologist: Atilano Median, DO  Additional Notes: Patient identified. Risks/Benefits/Options discussed with patient including but not limited to bleeding, infection, nerve damage, failed block, incomplete pain control. Patient expressed understanding and wished to proceed. All questions were answered. Sterile technique was used throughout the entire procedure. Please see nursing notes for vital signs. Aspirated in 5cc intervals with injection for negative confirmation. Patient was given instructions on fall risk and not to get out of bed. All questions and concerns addressed with instructions to call with any issues or inadequate analgesia.

## 2023-02-20 NOTE — Progress Notes (Addendum)
PROGRESS NOTE                                                                                                                                                                                                             Patient Demographics:    Angela Castro, is a 82 y.o. female, DOB - Apr 24, 1941, ZOX:[id number]  Outpatient Primary MD for the patient is Farris Has, MD    LOS - 1  Admit date - 02/19/2023    Chief Complaint  Patient presents with   hand swelling       Brief Narrative (HPI from H&P)   82 y.o. female with medical history significant of DM2, diastolic CHF,  CKD 3, IDA, gout, neuropathy. Presented with  right hand 3rd finger swelling and redness.   Presents with a right hand third finger swelling redness was at wound care yesterday and was told to go to emergency department has history of wounds that required an amputation with underlying history of diabetes there is some drainage of a right hand finger, purulent drainage for the past 3 days. She was originally seen at [facility], ER but left without being seen by provider.  She was diagnosed with severe right hand cellulitis and possible abscess and admitted to the hospital.   Subjective:    Angela Castro today has, No headache, No chest pain, No abdominal pain - No Nausea, No new weakness tingling or numbness, no shortness of breath, having pain in the right hand.   Assessment  & Plan :    Cellulitis, abscess of the right third digit with possible underlying osteomyelitis.  Her infection has been ongoing for the last several days, he has evidence of sepsis upon admission, has been placed on appropriate IV antibiotics, type screen done, MRSA nasal PCR so far is negative.  Hand surgery consulted, MRI pending however clinically looks like it might be difficult to preserve her right hand third digit.  Continue to monitor final cultures, continue empiric IV  antibiotics and monitor closely.  Type screen done, transfusions if needed, patient and family agreeable.  Chronic diastolic CHF EF 60% on echocardiogram done 2 years ago.  Currently compensated, n.p.o. due to above may require OR, gentle IV fluids,   AKI on CKD 4.  Baseline creatinine around 2.6.  Hold nephrotoxins, switch vancomycin to Zyvox and monitor.  She has seen nephrologist in the outpatient setting and was told may require dialysis soon however patient does not want HD.  Essential hypertension.  On Norvasc and Zebeta.  DM type II.On Semglee and sliding scale, monitor and adjust.Gentle D5 while NPO  Lab Results  Component Value Date   HGBA1C 7.3 (H) 02/19/2023   CBG (last 3)  Recent Labs    02/20/23 0047 02/20/23 0343 02/20/23 0825  GLUCAP 170* 156* 88        Condition - Extremely Guarded  Family Communication  : Husband and daughter at bedside  02/20/2023  Code Status : Full code  Consults  : Hand surgery  PUD Prophylaxis :    Procedures  :     MRI -       Disposition Plan  :    Status is: Inpatient  DVT Prophylaxis  :    SCDs Start: 02/19/23 2123   Lab Results  Component Value Date   PLT 291 02/20/2023    Diet :  Diet Order             Diet NPO time specified Except for: Ice Chips, Sips with Meds  Diet effective now                    Inpatient Medications  Scheduled Meds:  allopurinol  100 mg Oral BID   bisoprolol  10 mg Oral Daily   gabapentin  400 mg Oral BID   insulin aspart  0-9 Units Subcutaneous Q4H   insulin glargine-yfgn  25 Units Subcutaneous QHS   [START ON 02/21/2023] linezolid  600 mg Oral BID   simvastatin  5 mg Oral QHS   Continuous Infusions:  ceFEPime (MAXIPIME) IV 2 g (02/20/23 0108)   dextrose 5 % and 0.45 % NaCl with KCl 10 mEq/L     PRN Meds:.acetaminophen **OR** acetaminophen, HYDROcodone-acetaminophen, ondansetron **OR** ondansetron (ZOFRAN) IV    Objective:   Vitals:   02/20/23 0400 02/20/23  0539 02/20/23 0800 02/20/23 0819  BP: (!) 119/47 (!) 114/48 (!) 134/57   Pulse: 76 76 76 77  Resp: 20 18 14 19   Temp: 99.8 F (37.7 C) 98.7 F (37.1 C) 99 F (37.2 C) 99 F (37.2 C)  TempSrc: Oral Oral Oral   SpO2: 93% 93% 95% 94%  Weight:      Height:        Wt Readings from Last 3 Encounters:  02/19/23 85 kg  02/18/23 85 kg  01/16/23 85.7 kg     Intake/Output Summary (Last 24 hours) at 02/20/2023 0948 Last data filed at 02/19/2023 1808 Gross per 24 hour  Intake 400 ml  Output --  Net 400 ml     Physical Exam  Awake Alert, No new F.N deficits, Normal affect [place].AT,PERRAL Supple Neck, No JVD,   Symmetrical Chest wall movement, Good air movement bilaterally, CTAB RRR,No Gallops,Rubs or new Murmurs,  +ve B.Sounds, Abd Soft, No tenderness,   R hand below on 02/19/23    Data Review:    Recent Labs  Lab 02/18/23 1600 02/19/23 1651 02/20/23 0300  WBC 18.6* 18.7* 16.5*  HGB 8.9* 9.0* 7.7*  HCT 28.5* 27.6* 24.1*  PLT 303 304 291  MCV 96.9 95.2 94.1  MCH 30.3 31.0 30.1  MCHC 31.2 32.6 32.0  RDW 14.1 14.3 13.8  LYMPHSABS 1.3 1.6 1.1  MONOABS 1.5* 1.5* 1.2*  EOSABS 0.2 0.3 0.4  BASOSABS 0.1 0.1 0.1    Recent Labs  Lab 02/18/23 1600  02/19/23 1651 02/19/23 2139 02/20/23 0300 02/20/23 0746  NA 140 139  --  140  --   K 4.0 4.1  --  4.1  --   CL 106 104  --  103  --   CO2 22 26  --  21*  --   ANIONGAP 12 9  --  16*  --   GLUCOSE 130* 142*  --  157*  --   BUN 42* 50*  --  49*  --   CREATININE 3.73* 3.84*  --  3.82*  --   AST  --  27  --  29  --   ALT  --  13  --  19  --   ALKPHOS  --  68  --  60  --   BILITOT  --  0.7  --  0.8  --   ALBUMIN  --  3.1*  --  1.9*  --   CRP  --   --  26.9*  --   --   LATICACIDVEN  --   --   --  0.9 0.8  INR  --   --   --  1.5*  --   TSH  --   --  0.466  --   --   HGBA1C  --   --  7.3*  --   --   MG  --   --  1.3* 2.0  --   CALCIUM 8.7* 9.0  --  8.4*  --       Recent Labs  Lab 02/18/23 1600 02/19/23 1651  02/19/23 2139 02/20/23 0300 02/20/23 0746  CRP  --   --  26.9*  --   --   LATICACIDVEN  --   --   --  0.9 0.8  INR  --   --   --  1.5*  --   TSH  --   --  0.466  --   --   HGBA1C  --   --  7.3*  --   --   MG  --   --  1.3* 2.0  --   CALCIUM 8.7* 9.0  --  8.4*  --     --------------------------------------------------------------------------------------------------------------- No results found for: "CHOL", "HDL", "LDLCALC", "LDLDIRECT", "TRIG", "CHOLHDL"  Lab Results  Component Value Date   HGBA1C 7.3 (H) 02/19/2023   Recent Labs    02/19/23 2139  TSH 0.466   No results for input(s): "VITAMINB12", "FOLATE", "FERRITIN", "TIBC", "IRON", "RETICCTPCT" in the last 72 hours. ------------------------------------------------------------------------------------------------------------------ Cardiac Enzymes No results for input(s): "CKMB", "TROPONINI", "MYOGLOBIN" in the last 168 hours.  Invalid input(s): "CK"  Micro Results Recent Results (from the past 240 hour(s))  Aerobic Culture w Gram Stain (superficial specimen)     Status: None (Preliminary result)   Collection Time: 02/19/23  2:59 PM   Specimen: Wound  Result Value Ref Range Status   Specimen Description WOUND  Final   Special Requests NONE  Final   Gram Stain   Final    NO WBC SEEN MODERATE GRAM NEGATIVE RODS RARE GRAM POSITIVE COCCI IN PAIRS    Culture   Final    TOO YOUNG TO READ Performed at [facility], [address], [place], [place] [address]    Report Status PENDING  Incomplete  MRSA Next Gen by PCR, Nasal     Status: None   Collection Time: 02/20/23  1:54 AM   Specimen: Nasal Mucosa; Nasal Swab  Result Value Ref Range Status  MRSA by PCR Next Gen NOT DETECTED NOT DETECTED Final    Comment: (NOTE) The GeneXpert MRSA Assay (FDA approved for NASAL specimens only), is one component of a comprehensive MRSA colonization surveillance program. It is not intended to diagnose MRSA infection nor to  guide or monitor treatment for MRSA infections. Test performance is not FDA approved in patients less than 63 years old. Performed at [facility], [address], [place], [place] [address]     Radiology Reports DG Finger Middle Right  Result Date: 02/18/2023 CLINICAL DATA:  Possible infection. EXAM: RIGHT MIDDLE FINGER 3V COMPARISON:  None Available. FINDINGS: Soft tissue swelling noted of fourth digit diffusely. There has been previous amputation of the the distal phalanx of fourth digit. Slight truncation as well of the distal margin of the middle phalanx. Of fourth digit there is no definite erosive changes although there appears to be an open wound along the distal margin of the finger. There are erosive changes however of the visible portions of the distal phalanx of third and fifth digits. Please correlate with history. Inflammatory infectious process would be possible. Global osteopenia. IMPRESSION: Presumed prior amputation of the distal margin of fourth digit with severe soft tissue swelling and soft tissue defect along the fourth digit margin. Erosive changes of the distal phalanges of third and fifth digits. Please correlate for etiology. Further workup as clinically directed such as MRI or bone scan Electronically Signed   By: Karen Kays M.D.   On: 02/18/2023 18:14      Signature  -   Susa Raring M.D on 02/20/2023 at 9:48 AM   -  To page go to [email or web address]

## 2023-02-20 NOTE — Assessment & Plan Note (Signed)
Will replace and recheck 

## 2023-02-20 NOTE — Progress Notes (Signed)
Pharmacy Antibiotic Note  Angela Castro is a 82 y.o. female admitted on 02/19/2023 with finger infection.  Pharmacy has been consulted for Vancomycin and Cefepime dosing.  Pt has hx of previous digit amputations.  Presented to ED with redness, swelling, and discharge from R 3rd finger.  SCr elevated 3.84, known CKD with SCr 2.15 Dec 2022.  Plan for I&D vs amputation. Changing vanc to linezolid d/t his of CKD.   Plan: Linezolid 600mg  PO BID Cefepime 2gm IV q24h Follow-up renal function, culture data, and clinical progress  Height: 5' (152.4 cm) Weight: 85 kg (187 lb 6.3 oz) IBW/kg (Calculated) : 45.5  Temp (24hrs), Avg:99.2 F (37.3 C), Min:97.7 F (36.5 C), Max:101.3 F (38.5 C)  Recent Labs  Lab 02/18/23 1600 02/19/23 1651 02/20/23 0300 02/20/23 0746  WBC 18.6* 18.7* 16.5*  --   CREATININE 3.73* 3.84* 3.82*  --   LATICACIDVEN  --   --  0.9 0.8    Estimated Creatinine Clearance: 11.2 mL/min (A) (by C-G formula based on SCr of 3.82 mg/dL (H)).    Allergies  Allergen Reactions   Sulfa Antibiotics Shortness Of Breath and Rash   Adhesive [Tape] Other (See Comments)    Causes redness, please use paper tape   Keflex [Cephalexin] Other (See Comments)    Possibly caused lethargy (taken with Flagyl)   Lisinopril Cough   Metronidazole Other (See Comments)    Possibly caused lethargy (taken with keflex)   Teflaro [Ceftaroline] Other (See Comments)    Lethargy, possibly rash    Antimicrobials this admission: Vanc 10/10 x1 Linezolid 10/12>> Cefepime 10/10 >>   Dose adjustments this admission:   Microbiology results: 10/10 wound cx: pending   Ulyses Southward, PharmD, BCIDP, AAHIVP, CPP Infectious Disease Pharmacist 02/20/2023 12:52 PM

## 2023-02-20 NOTE — Transfer of Care (Signed)
Immediate Anesthesia Transfer of Care Note  Patient: Angela Castro  Procedure(s) Performed: IRRIGATION AND DEBRIDEMENT vs. AMPUTATION RIGHT LONG FINGER (Right: Middle Finger)  Patient Location: PACU  Anesthesia Type:MAC combined with regional for post-op pain  Level of Consciousness: awake and alert   Airway & Oxygen Therapy: Patient Spontanous Breathing and Patient connected to nasal cannula oxygen  Post-op Assessment: Report given to RN and Post -op Vital signs reviewed and stable  Post vital signs: Reviewed and stable  Last Vitals:  Vitals Value Taken Time  BP 111/57   Temp    Pulse 83 02/20/23 2252  Resp 14 02/20/23 2253  SpO2 94% 02/20/23 2252  Vitals shown include unfiled device data.  Last Pain:  Vitals:   02/20/23 1915  TempSrc:   PainSc: 0-No pain         Complications: No notable events documented.

## 2023-02-20 NOTE — Progress Notes (Signed)
Mobility Specialist Progress Note;   02/20/23 1100  Mobility  Activity Ambulated with assistance to bathroom  Level of Assistance Contact guard assist, steadying assist  Assistive Device Four wheel walker  Distance Ambulated (ft) 15 ft  Activity Response Tolerated well  Mobility Referral Yes  $Mobility charge 1 Mobility  Mobility Specialist Start Time (ACUTE ONLY) 1100  Mobility Specialist Stop Time (ACUTE ONLY) 1120  Mobility Specialist Time Calculation (min) (ACUTE ONLY) 20 min   Pt agreeable to mobility. Requested assistance to BR. Required minG assistance for STS and ambulation. Ambulated on 2LO2, VSS. Pt back in bed with all needs met. Family in room.   Caesar Bookman Mobility Specialist Please contact via SecureChat or Rehab Office [phone]

## 2023-02-20 NOTE — Consult Note (Addendum)
Hospital Consult    Reason for Consult:  decreased pulses RUE Requesting Physician:  Charma Igo, PA MRN #:  [id number]  History of Present Illness: This is a 82 y.o. female  with pertinent past medical history including DM type II, CHF, CKD 3, HTN, HLD, Asthma, and peripheral neuropathy who presented to ER at recommendation of the wound care center. She has long history of multiple finger infections and prior auto amputation of several digits as well as prior left hand ray amputations by Orthopedics. She has been managed by wound care center as well. She explains that on Sunday she started noticing increased swelling, redness and drainage from her right middle finger. She does not have any pain associated with her finger. She has minimal feeling in her fingers secondary to neuropathy. She has no other pain in the right forearm or upper arm. She explains that she is right hand dominant. She does not usually have any difficulty performing her ADL's. She is not a smoker. She denies any fever, chills, n/v. She was seen by Orthopedics this admission who felt that she had some decreased pulses in her right upper extremity so vascular surgery was consulted.   Past Medical History:  Diagnosis Date   Asthma    Diabetes mellitus without complication (HCC)    Hypertension     Past Surgical History:  Procedure Laterality Date   AMPUTATION Left 08/26/2016   Procedure: AMPUTATION RAY/1ST;  Surgeon: Toni Arthurs, MD;  Location: [facility];  Service: Orthopedics;  Laterality: Left;   IR GENERIC HISTORICAL  07/30/2016   IR US GUIDE VASC ACCESS RIGHT 07/30/2016 Richarda Overlie, MD [facility]   IR GENERIC HISTORICAL  07/30/2016   IR FLUORO GUIDE CV MIDLINE PICC RIGHT 07/30/2016 Richarda Overlie, MD [facility]   KNEE ARTHROSCOPY     x 4   ORIF FEMUR FRACTURE Right 03/25/2021   Procedure: OPEN REDUCTION INTERNAL FIXATION (ORIF) DISTAL FEMUR FRACTURE;  Surgeon: Roby Lofts, MD;  Location: [facility] OR;  Service: Orthopedics;   Laterality: Right;   REPLACEMENT TOTAL KNEE      Allergies  Allergen Reactions   Sulfa Antibiotics Shortness Of Breath and Rash   Adhesive [Tape] Other (See Comments)    Causes redness, please use paper tape   Keflex [Cephalexin] Other (See Comments)    Possibly caused lethargy (taken with Flagyl)   Lisinopril Cough   Metronidazole Other (See Comments)    Possibly caused lethargy (taken with keflex)   Teflaro [Ceftaroline] Other (See Comments)    Lethargy, possibly rash    Prior to Admission medications   Medication Sig Start Date End Date Taking? Authorizing Provider  allopurinol (ZYLOPRIM) 100 MG tablet Take 100 mg by mouth 2 (two) times daily.    Yes [provider]  amLODipine (NORVASC) 5 MG tablet Take 5 mg by mouth every morning.   Yes [provider]  bisoprolol (ZEBETA) 10 MG tablet Take 10 mg by mouth in the morning.   Yes [provider]  bumetanide (BUMEX) 1 MG tablet Take 1 tablet (1 mg total) by mouth daily. 01/16/23  Yes Patwardhan, Anabel Bene, MD  Calcium Carbonate-Vitamin D (CALCIUM-D) 600-400 MG-UNIT TABS Take 1 tablet by mouth every morning.   Yes [provider]  cholecalciferol (VITAMIN D3) 25 MCG (1000 UNIT) tablet Take 1,000 Units by mouth daily.   Yes [provider]  clobetasol (TEMOVATE) 0.05 % external solution Apply 1 application topically daily as needed (scalp itching/psoriasis).   Yes [provider]  ferrous sulfate (FERROUSUL) 325 (65 FE) MG tablet Take 1 tablet (325 mg total) by mouth 2 (two) times daily with a meal. 09/03/16  Yes Zannie Cove, MD  fluocinonide cream (LIDEX) 0.05 % Apply 1 application topically 2 (two) times daily as needed (psoriasis).    Yes [provider]  gabapentin (NEURONTIN) 400 MG capsule Take 400 mg by mouth 2 (two) times daily.   Yes [provider]  glipiZIDE (GLUCOTROL XL) 5 MG 24 hr tablet Take 5 mg by mouth daily with breakfast.   Yes [provider]  insulin glargine (LANTUS SOLOSTAR) 100 UNIT/ML Solostar Pen Inject 50 Units into the skin at bedtime. Patient taking differently: Inject 56 Units into the skin at bedtime. 03/28/21  Yes Dahal, Melina Schools, MD  loratadine (CLARITIN) 10 MG tablet Take 10 mg by mouth every morning.   Yes [provider]  losartan (COZAAR) 25 MG tablet Take 25 mg by mouth every morning. 02/02/21  Yes [provider]  Multiple Vitamins-Iron (MULTIVITAMIN/IRON PO) Take 1 tablet by mouth every morning.   Yes [provider]  simvastatin (ZOCOR) 5 MG tablet Take 5 mg by mouth at bedtime.    Yes [provider]  sitaGLIPtin (JANUVIA) 50 MG tablet Take 50 mg by mouth every morning.   Yes [provider]  traMADol (ULTRAM) 50 MG tablet Take 50 mg by mouth every 6 (six) hours as needed for moderate pain. 12/24/20  Yes [provider]  vitamin C (ASCORBIC ACID) 500 MG tablet Take 500 mg by mouth every morning.   Yes [provider]  gabapentin (NEURONTIN) 300 MG capsule Take 1 capsule (300 mg total) by mouth 2 (two) times daily. Patient not taking: Reported on 02/19/2023 03/28/21   Lorin Glass, MD    Social History   Socioeconomic History   Marital status: Married    Spouse name: Not on file   Number of children: 2   Years of education: Not on file   Highest education level: Not on file  Occupational History   Not on file  Tobacco Use   Smoking status: Never   Smokeless tobacco: Never  Vaping Use   Vaping status: Never Used  Substance and Sexual Activity   Alcohol use: No   Drug use: No   Sexual activity: Not on file  Other Topics Concern   Not on file  Social History Narrative   Not on file   Social Determinants of Health   Financial Resource Strain: Not on file  Food Insecurity: No Food Insecurity (02/19/2023)   Hunger Vital Sign    Worried About Running Out of Food in the Last Year: Never true    Ran Out of Food in the Last  Year: Never true  Transportation Needs: No Transportation Needs (02/19/2023)   PRAPARE - Administrator, Civil Service (Medical): No    Lack of Transportation (Non-Medical): No  Physical Activity: Not on file  Stress: Not on file  Social Connections: Not on file  Intimate Partner Violence: Not At Risk (02/19/2023)   Humiliation, Afraid, Rape, and Kick questionnaire    Fear of Current or Ex-Partner: No    Emotionally Abused: No    Physically Abused: No    Sexually Abused: No     Family History  Problem Relation Age of Onset   Heart disease Mother     ROS: Otherwise negative unless mentioned in HPI  Physical Examination  Vitals:   02/20/23 [phone]  02/20/23 0954  BP:    Pulse: 77   Resp: 19   Temp: 99 F (37.2 C) 99.2 F (37.3 C)  SpO2: 94%    Body mass index is 36.6 kg/m.  General:  WDWN in NAD Gait: Not observed HENT: WNL, normocephalic Pulmonary: normal non-labored breathing, without wheezing Cardiac: regular Vascular Exam/Pulses: 2+ right brachial pulse, 2+ right radial and 1+ ulnar pulse, right hand warm and well perfused; Brisk doppler brachial, radial, ulnar and palmar arch signals Extremities: with ischemic changes to distal 3rd and 4th fingers, without Gangrene , with cellulitis of right 3rd finger, finger very edematous; with purulent malodorous discharge from 3rd finger tip, non tender; motor intact. Decreased sensation on palmar aspect of hand and fingers     Musculoskeletal: no muscle wasting or atrophy  Neurologic: A&O X 3;  No focal weakness or paresthesias are detected; speech is fluent/normal Psychiatric:  The pt has Normal affect.   CBC    Component Value Date/Time   WBC 16.5 (H) 02/20/2023 0300   RBC 2.56 (L) 02/20/2023 0300   HGB 7.7 (L) 02/20/2023 0300   HCT 24.1 (L) 02/20/2023 0300   PLT 291 02/20/2023 0300   MCV 94.1 02/20/2023 0300   MCH 30.1 02/20/2023 0300   MCHC 32.0 02/20/2023 0300   RDW 13.8 02/20/2023 0300    LYMPHSABS 1.1 02/20/2023 0300   MONOABS 1.2 (H) 02/20/2023 0300   EOSABS 0.4 02/20/2023 0300   BASOSABS 0.1 02/20/2023 0300    BMET    Component Value Date/Time   NA 140 02/20/2023 0300   NA 144 01/02/2023 1412   K 4.1 02/20/2023 0300   CL 103 02/20/2023 0300   CO2 21 (L) 02/20/2023 0300   GLUCOSE 157 (H) 02/20/2023 0300   BUN 49 (H) 02/20/2023 0300   BUN 35 (H) 01/02/2023 1412   CREATININE 3.82 (H) 02/20/2023 0300   CALCIUM 8.4 (L) 02/20/2023 0300   GFRNONAA 11 (L) 02/20/2023 0300   GFRAA 35 (L) 08/28/2016 0421    COAGS: Lab Results  Component Value Date   INR 1.5 (H) 02/20/2023   INR 1.26 08/24/2016     Non-Invasive Vascular Imaging:   Right upper extremity arterial duplex pending  Statin:  Yes.   Beta Blocker:  Yes.   Aspirin:  No. ACEI:  No. ARB:  No. CCB use:  No Other antiplatelets/anticoagulants:  No.    ASSESSMENT/PLAN: This is a 82 y.o. female pertinent past medical history including DM type II, CHF, CKD 3, HTN, HLD, Asthma, and peripheral neuropathy who presented to ER at recommendation of the wound care center. She explains that on Sunday she started noticing increased swelling, redness and drainage from her right middle finger. There was some concern about decreased pulses in the right upper extremity so vascular was consulted. She has palpable pulses in her right radial, ulnar and brachial. Doppler signals also obtained with brisk signals throughout upper arm and palm.Order has been placed for right upper extremity arterial duplex. This is pending. 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 She is currently being treated with Abx. Likely will need further surgical management by Hand Surgery. The on call Vascular surgeon, Dr. Hetty Blend will see patient later this afternoon to follow up after her duplex to provide further management plans but do not suspect that she will need any vascular intervention.    Graceann Congress PA-C [facility] [phone] 02/20/2023  10:10 AM  VASCULAR STAFF ADDENDUM: I have independently  interviewed and examined the patient. I agree with the above.  Longstanding diabetic with a history of multiple finger wounds and infection requiring amputation.  She has palpable radial and ulnar pulses with a palmar arch signal.  I explained to her and her daughter that her wound issues likely stemming from microvascular disease due to diabetes. She has normal macrovascular perfusion based on exam and without flow-limiting stenosis on upper extremity duplex. She does not warrant any vascular intervention at this time. Okay to continue with management by hand surgery  Daria Pastures MD [facility] Phone Number: [phone] 02/20/2023 2:07 PM

## 2023-02-20 NOTE — Consult Note (Addendum)
Reason for Consult:Right long finger infection Referring Physician: Susa Raring Time called: 1610 Time at bedside: 0920   Angela Castro is an 82 y.o. female.  HPI: Angela Castro developed swelling and discharge from her right long finger on Sunday. It began suddenly and there were no inciting factors. She has had a long hx/o finger infections and has been under the care of the wound center who directed her to the ED for admission and hand surgery was consulted the following morning. The finger does not bother her as she has advanced neuropathy and she denies fevers, chills, sweats, N/V. She is RHD.  Past Medical History:  Diagnosis Date   Asthma    Diabetes mellitus without complication (HCC)    Hypertension     Past Surgical History:  Procedure Laterality Date   AMPUTATION Left 08/26/2016   Procedure: AMPUTATION RAY/1ST;  Surgeon: Toni Arthurs, MD;  Location: [facility] OR;  Service: Orthopedics;  Laterality: Left;   IR GENERIC HISTORICAL  07/30/2016   IR US GUIDE VASC ACCESS RIGHT 07/30/2016 Richarda Overlie, MD [facility]   IR GENERIC HISTORICAL  07/30/2016   IR FLUORO GUIDE CV MIDLINE PICC RIGHT 07/30/2016 Richarda Overlie, MD [facility]   KNEE ARTHROSCOPY     x 4   ORIF FEMUR FRACTURE Right 03/25/2021   Procedure: OPEN REDUCTION INTERNAL FIXATION (ORIF) DISTAL FEMUR FRACTURE;  Surgeon: Roby Lofts, MD;  Location: [facility] OR;  Service: Orthopedics;  Laterality: Right;   REPLACEMENT TOTAL KNEE      Family History  Problem Relation Age of Onset   Heart disease Mother     Social History:  reports that she has never smoked. She has never used smokeless tobacco. She reports that she does not drink alcohol and does not use drugs.  Allergies:  Allergies  Allergen Reactions   Sulfa Antibiotics Shortness Of Breath and Rash   Adhesive [Tape] Other (See Comments)    Causes redness, please use paper tape   Keflex [Cephalexin] Other (See Comments)    Possibly caused lethargy (taken with Flagyl)    Lisinopril Cough   Metronidazole Other (See Comments)    Possibly caused lethargy (taken with keflex)   Teflaro [Ceftaroline] Other (See Comments)    Lethargy, possibly rash    Medications: I have reviewed the patient's current medications.  Results for orders placed or performed during the hospital encounter of 02/19/23 (from the past 48 hour(s))  Aerobic Culture w Gram Stain (superficial specimen)     Status: None (Preliminary result)   Collection Time: 02/19/23  2:59 PM   Specimen: Wound  Result Value Ref Range   Specimen Description WOUND    Special Requests NONE    Gram Stain      NO WBC SEEN MODERATE GRAM NEGATIVE RODS RARE GRAM POSITIVE COCCI IN PAIRS    Culture      TOO YOUNG TO READ Performed at [facility], [address], [place], [place] [address]    Report Status PENDING   CBC with Differential     Status: Abnormal   Collection Time: 02/19/23  4:51 PM  Result Value Ref Range   WBC 18.7 (H) 4.0 - 10.5 K/uL   RBC 2.90 (L) 3.87 - 5.11 MIL/uL   Hemoglobin 9.0 (L) 12.0 - 15.0 g/dL   HCT 40.9 (L) 81.1 - 91.4 %   MCV 95.2 80.0 - 100.0 fL   MCH 31.0 26.0 - 34.0 pg   MCHC 32.6 30.0 - 36.0 g/dL   RDW  14.3 11.5 - 15.5 %   Platelets 304 150 - 400 K/uL   nRBC 0.0 0.0 - 0.2 %   Neutrophils Relative % 81 %   Neutro Abs 15.1 (H) 1.7 - 7.7 K/uL   Lymphocytes Relative 8 %   Lymphs Abs 1.6 0.7 - 4.0 K/uL   Monocytes Relative 8 %   Monocytes Absolute 1.5 (H) 0.1 - 1.0 K/uL   Eosinophils Relative 2 %   Eosinophils Absolute 0.3 0.0 - 0.5 K/uL   Basophils Relative 0 %   Basophils Absolute 0.1 0.0 - 0.1 K/uL   Immature Granulocytes 1 %   Abs Immature Granulocytes 0.10 (H) 0.00 - 0.07 K/uL    Comment: Performed at [facility], [address] [place], [place] [address]  Comprehensive metabolic panel     Status: Abnormal   Collection Time: 02/19/23  4:51 PM  Result Value Ref Range   Sodium 139 135 - 145 mmol/L   Potassium 4.1 3.5 - 5.1 mmol/L    Chloride 104 98 - 111 mmol/L   CO2 26 22 - 32 mmol/L   Glucose, Bld 142 (H) 70 - 99 mg/dL    Comment: Glucose reference range applies only to samples taken after fasting for at least 8 hours.   BUN 50 (H) 8 - 23 mg/dL   Creatinine, Ser 6.04 (H) 0.44 - 1.00 mg/dL   Calcium 9.0 8.9 - 54.0 mg/dL   Total Protein 6.6 6.5 - 8.1 g/dL   Albumin 3.1 (L) 3.5 - 5.0 g/dL   AST 27 15 - 41 U/L   ALT 13 0 - 44 U/L   Alkaline Phosphatase 68 38 - 126 U/L   Total Bilirubin 0.7 0.3 - 1.2 mg/dL   GFR, Estimated 11 (L) >60 mL/min    Comment: (NOTE) Calculated using the CKD-EPI Creatinine Equation (2021)    Anion gap 9 5 - 15    Comment: Performed at [facility], [address] [place], [place] [address]  Osmolality, urine     Status: None   Collection Time: 02/19/23  9:24 PM  Result Value Ref Range   Osmolality, Ur 354 300 - 900 mOsm/kg    Comment: Performed at [facility], [address] [place], [place] [address]  Creatinine, urine, random     Status: None   Collection Time: 02/19/23  9:24 PM  Result Value Ref Range   Creatinine, Urine 119 mg/dL    Comment: Performed at [facility], [address] [place], [place] [address]  Sodium, urine, random     Status: None   Collection Time: 02/19/23  9:24 PM  Result Value Ref Range   Sodium, Ur 27 mmol/L    Comment: Performed at [facility], [address] [place], [place] [address]  Hemoglobin A1c     Status: Abnormal   Collection Time: 02/19/23  9:39 PM  Result Value Ref Range   Hgb A1c MFr Bld 7.3 (H) 4.8 - 5.6 %    Comment: (NOTE) Pre diabetes:          5.7%-6.4%  Diabetes:              >6.4%  Glycemic control for   <7.0% adults with diabetes    Mean Plasma Glucose 162.81 mg/dL    Comment: Performed at [facility], [address] [place], [place] [address]  CK     Status: Abnormal   Collection Time: 02/19/23  9:39 PM  Result Value Ref Range   Total CK 576 (H) 38 - 234 U/L    Comment:  Performed at [facility], [address], [place], [place] [address]  Osmolality     Status: Abnormal   Collection Time: 02/19/23  9:39 PM  Result Value Ref Range   Osmolality 310 (H) 275 - 295 mOsm/kg    Comment: Performed at [facility], [address], [place], [place] [address]  TSH     Status: None   Collection Time: 02/19/23  9:39 PM  Result Value Ref Range   TSH 0.466 0.350 - 4.500 uIU/mL    Comment: Performed by a 3rd Generation assay with a functional sensitivity of <=0.01 uIU/mL. Performed at [facility], [address] [place], [place] [address]   Magnesium     Status: Abnormal   Collection Time: 02/19/23  9:39 PM  Result Value Ref Range   Magnesium 1.3 (L) 1.7 - 2.4 mg/dL    Comment: Performed at [facility], [address], [place], [place] [address]  Phosphorus     Status: None   Collection Time: 02/19/23  9:39 PM  Result Value Ref Range   Phosphorus 4.3 2.5 - 4.6 mg/dL    Comment: Performed at [facility], [address] [place], [place] [address]  Sedimentation rate     Status: Abnormal   Collection Time: 02/19/23  9:39 PM  Result Value Ref Range   Sed Rate 134 (H) 0 - 22 mm/hr    Comment: Performed at [facility], [address] [place], [place] [address]  C-reactive protein     Status: Abnormal   Collection Time: 02/19/23  9:39 PM  Result Value Ref Range   CRP 26.9 (H) <1.0 mg/dL    Comment: Performed at [facility], [address], [place], [place] [address]  Prealbumin     Status: Abnormal   Collection Time: 02/19/23  9:39 PM  Result Value Ref Range   Prealbumin 8 (L) 18 - 38 mg/dL    Comment: Performed at [facility], [address], [place], [place] [address]  Glucose, capillary     Status: Abnormal   Collection Time: 02/19/23  9:44 PM  Result Value Ref Range   Glucose-Capillary 200 (H) 70 - 99 mg/dL    Comment: Glucose reference range applies only to samples taken after fasting for at least 8  hours.  Glucose, capillary     Status: Abnormal   Collection Time: 02/20/23 12:47 AM  Result Value Ref Range   Glucose-Capillary 170 (H) 70 - 99 mg/dL    Comment: Glucose reference range applies only to samples taken after fasting for at least 8 hours.  MRSA Next Gen by PCR, Nasal     Status: None   Collection Time: 02/20/23  1:54 AM   Specimen: Nasal Mucosa; Nasal Swab  Result Value Ref Range   MRSA by PCR Next Gen NOT DETECTED NOT DETECTED    Comment: (NOTE) The GeneXpert MRSA Assay (FDA approved for NASAL specimens only), is one component of a comprehensive MRSA colonization surveillance program. It is not intended to diagnose MRSA infection nor to guide or monitor treatment for MRSA infections. Test performance is not FDA approved in patients less than 92 years old. Performed at [facility], [address] [place], [place] [address]   Magnesium     Status: None   Collection Time: 02/20/23  3:00 AM  Result Value Ref Range   Magnesium 2.0 1.7 - 2.4 mg/dL    Comment: Performed at [facility], [address], [place], [place] [address]  Phosphorus     Status: None   Collection Time: 02/20/23  3:00 AM  Result Value Ref Range   Phosphorus 4.3 2.5 - 4.6 mg/dL    Comment: Performed at [facility], [address], [place], [place] [address]  Comprehensive metabolic panel     Status: Abnormal   Collection Time: 02/20/23  3:00 AM  Result Value Ref Range   Sodium 140 135 - 145 mmol/L   Potassium 4.1 3.5 - 5.1 mmol/L   Chloride 103 98 - 111 mmol/L   CO2 21 (L) 22 - 32 mmol/L   Glucose, Bld 157 (H) 70 - 99 mg/dL    Comment: Glucose reference range applies only to samples taken after fasting for at least 8 hours.   BUN 49 (H) 8 - 23 mg/dL   Creatinine, Ser 0.98 (H) 0.44 - 1.00 mg/dL   Calcium 8.4 (L) 8.9 - 10.3 mg/dL   Total Protein 5.3 (L) 6.5 - 8.1 g/dL   Albumin 1.9 (L) 3.5 - 5.0 g/dL   AST 29 15 - 41 U/L   ALT 19 0 - 44 U/L   Alkaline Phosphatase 60 38 -  126 U/L   Total Bilirubin 0.8 0.3 - 1.2 mg/dL   GFR, Estimated 11 (L) >60 mL/min    Comment: (NOTE) Calculated using the CKD-EPI Creatinine Equation (2021)    Anion gap 16 (H) 5 - 15    Comment: Performed at [facility], [address] [place], [place] [address]  CK     Status: Abnormal   Collection Time: 02/20/23  3:00 AM  Result Value Ref Range   Total CK 387 (H) 38 - 234 U/L    Comment: Performed at [facility], [address] [place], [place] [address]  CBC with Differential     Status: Abnormal   Collection Time: 02/20/23  3:00 AM  Result Value Ref Range   WBC 16.5 (H) 4.0 - 10.5 K/uL   RBC 2.56 (L) 3.87 - 5.11 MIL/uL   Hemoglobin 7.7 (L) 12.0 - 15.0 g/dL   HCT 62.1 (L) 30.8 - 65.7 %   MCV 94.1 80.0 - 100.0 fL   MCH 30.1 26.0 - 34.0 pg   MCHC 32.0 30.0 - 36.0 g/dL   RDW 84.6 96.2 - 95.2 %   Platelets 291 150 - 400 K/uL   nRBC 0.0 0.0 - 0.2 %   Neutrophils Relative % 84 %   Neutro Abs 13.7 (H) 1.7 - 7.7 K/uL   Lymphocytes Relative 6 %   Lymphs Abs 1.1 0.7 - 4.0 K/uL   Monocytes Relative 7 %   Monocytes Absolute 1.2 (H) 0.1 - 1.0 K/uL   Eosinophils Relative 3 %   Eosinophils Absolute 0.4 0.0 - 0.5 K/uL   Basophils Relative 0 %   Basophils Absolute 0.1 0.0 - 0.1 K/uL   Immature Granulocytes 0 %   Abs Immature Granulocytes 0.07 0.00 - 0.07 K/uL    Comment: Performed at [facility], [address], [place], [place] [address]  Lactic acid, plasma     Status: None   Collection Time: 02/20/23  3:00 AM  Result Value Ref Range   Lactic Acid, Venous 0.9 0.5 - 1.9 mmol/L    Comment: Performed at [facility]  Lab, [address], [place], [place] [address]  Protime-INR     Status: Abnormal   Collection Time: 02/20/23  3:00 AM  Result Value Ref Range   Prothrombin Time 18.1 (H) 11.4 - 15.2 seconds   INR 1.5 (H) 0.8 - 1.2    Comment: (NOTE) INR goal varies based on device and disease states. Performed at [facility], [address]  [place], [place] [address]   APTT     Status: Abnormal   Collection Time: 02/20/23  3:00 AM  Result Value Ref Range   aPTT 41 (H) 24 - 36 seconds    Comment:        IF BASELINE aPTT IS ELEVATED, SUGGEST PATIENT RISK ASSESSMENT BE USED TO DETERMINE APPROPRIATE ANTICOAGULANT THERAPY. Performed at [facility], [address] [place], [place] [address]   Glucose, capillary     Status: Abnormal   Collection Time: 02/20/23  3:43 AM  Result Value Ref Range   Glucose-Capillary 156 (H) 70 - 99 mg/dL    Comment: Glucose reference range applies only to samples taken after fasting for at least 8 hours.  Type and screen     Status: None   Collection Time: 02/20/23  7:43 AM  Result Value Ref Range   ABO/RH(D) O POS    Antibody Screen NEG    Sample Expiration      02/23/2023,2359 Performed at [facility], [address], [place], [place] [address]   Lactic acid, plasma     Status: None   Collection Time: 02/20/23  7:46 AM  Result Value Ref Range   Lactic Acid, Venous 0.8 0.5 - 1.9 mmol/L    Comment: Performed at [facility], [address] [place], [place] [address]  Glucose, capillary     Status: None   Collection Time: 02/20/23  8:25 AM  Result Value Ref Range   Glucose-Capillary 88 70 - 99 mg/dL    Comment: Glucose reference range applies only to samples taken after fasting for at least 8 hours.    DG Finger Middle Right  Result Date: 02/18/2023 CLINICAL DATA:  Possible infection. EXAM: RIGHT MIDDLE FINGER 3V COMPARISON:  None Available. FINDINGS: Soft tissue swelling noted of fourth digit diffusely. There has been previous amputation of the the distal phalanx of fourth digit. Slight truncation as well of the distal margin of the middle phalanx. Of fourth digit there is no definite erosive changes although there appears to be an open wound along the distal margin of the finger. There are erosive changes however of the visible portions of the distal phalanx of third and  fifth digits. Please correlate with history. Inflammatory infectious process would be possible. Global osteopenia. IMPRESSION: Presumed prior amputation of the distal margin of fourth digit with severe soft tissue swelling and soft tissue defect along the fourth digit margin. Erosive changes of the distal phalanges of third and fifth digits. Please correlate for etiology. Further workup as clinically directed such as MRI or bone scan Electronically Signed   By: Karen Kays M.D.   On: 02/18/2023 18:14    Review of Systems  Constitutional:  Negative for chills, diaphoresis and fever.  HENT:  Negative for ear discharge, ear pain, hearing loss and tinnitus.   Eyes:  Negative for photophobia and pain.  Respiratory:  Negative for cough and shortness of breath.   Cardiovascular:  Negative for chest pain.  Gastrointestinal:  Negative for abdominal pain, nausea and vomiting.  Genitourinary:  Negative for dysuria, flank pain, frequency and urgency.  Musculoskeletal:  Negative for arthralgias, back pain, myalgias and neck pain.  Neurological:  Negative for dizziness and headaches.  Hematological:  Does not bruise/bleed easily.  Psychiatric/Behavioral:  The patient is not nervous/anxious.    Blood pressure (!) 134/57, pulse 77, temperature 99 F (37.2 C), resp. rate 19, height 5' (1.524 m), weight 85 kg, SpO2 94%. Physical Exam Constitutional:      General: She is not in acute distress.    Appearance: She is well-developed. She is not diaphoretic.  HENT:     Head: Normocephalic and atraumatic.  Eyes:     General: No scleral icterus.       Right eye: No discharge.        Left eye: No discharge.     Conjunctiva/sclera: Conjunctivae normal.  Cardiovascular:     Rate and Rhythm: Normal rate and regular rhythm.  Pulmonary:     Effort: Pulmonary effort is normal. No respiratory distress.  Musculoskeletal:     Cervical back: Normal range of motion.     Comments: Right shoulder, elbow, wrist, digits-  no skin wounds, fusiform edema entire long digit with purulent malodorous discharge from tip, no TTP, no instability, no blocks to motion  Sens  Ax/R/M/U intact  Mot   Ax/ R/ PIN/ M/ AIN/ U intact  Rad 0  Skin:    General: Skin is warm and dry.  Neurological:     Mental Status: She is alert.  Psychiatric:        Mood and Affect: Mood normal.        Behavior: Behavior normal.    Assessment/Plan: Right long finger infection -- Plan I&D vs amputation this afternoon by Dr. Kerry Fort. Please keep NPO.    Freeman Caldron, PA-C Orthopedic Surgery [phone] 02/20/2023, 9:29 AM   Addendum/Attestation: I saw and examined the patient and agree with the plan listed above.    Shaune Pollack, MD Hand & Upper Extremity Surgery [facility] [phone]

## 2023-02-21 ENCOUNTER — Inpatient Hospital Stay (HOSPITAL_COMMUNITY): Payer: Medicare PPO

## 2023-02-21 ENCOUNTER — Encounter (HOSPITAL_COMMUNITY): Payer: Self-pay | Admitting: Internal Medicine

## 2023-02-21 DIAGNOSIS — M86141 Other acute osteomyelitis, right hand: Secondary | ICD-10-CM | POA: Diagnosis not present

## 2023-02-21 LAB — BASIC METABOLIC PANEL
Anion gap: 12 (ref 5–15)
BUN: 51 mg/dL — ABNORMAL HIGH (ref 8–23)
CO2: 21 mmol/L — ABNORMAL LOW (ref 22–32)
Calcium: 8.3 mg/dL — ABNORMAL LOW (ref 8.9–10.3)
Chloride: 107 mmol/L (ref 98–111)
Creatinine, Ser: 4.4 mg/dL — ABNORMAL HIGH (ref 0.44–1.00)
GFR, Estimated: 10 mL/min — ABNORMAL LOW (ref >60)
Glucose, Bld: 197 mg/dL — ABNORMAL HIGH (ref 70–99)
Potassium: 4.5 mmol/L (ref 3.5–5.1)
Sodium: 140 mmol/L (ref 135–145)

## 2023-02-21 LAB — CBC WITH DIFFERENTIAL/PLATELET
Abs Immature Granulocytes: 0.11 10*3/uL — ABNORMAL HIGH (ref 0.00–0.07)
Basophils Absolute: 0 10*3/uL (ref 0.0–0.1)
Basophils Relative: 0 %
Eosinophils Absolute: 0 10*3/uL (ref 0.0–0.5)
Eosinophils Relative: 0 %
HCT: 25 % — ABNORMAL LOW (ref 36.0–46.0)
Hemoglobin: 8.1 g/dL — ABNORMAL LOW (ref 12.0–15.0)
Immature Granulocytes: 1 %
Lymphocytes Relative: 3 %
Lymphs Abs: 0.5 10*3/uL — ABNORMAL LOW (ref 0.7–4.0)
MCH: 31.3 pg (ref 26.0–34.0)
MCHC: 32.4 g/dL (ref 30.0–36.0)
MCV: 96.5 fL (ref 80.0–100.0)
Monocytes Absolute: 0.1 10*3/uL (ref 0.1–1.0)
Monocytes Relative: 1 %
Neutro Abs: 16.4 10*3/uL — ABNORMAL HIGH (ref 1.7–7.7)
Neutrophils Relative %: 95 %
Platelets: 277 10*3/uL (ref 150–400)
RBC: 2.59 MIL/uL — ABNORMAL LOW (ref 3.87–5.11)
RDW: 13.8 % (ref 11.5–15.5)
WBC: 17.2 10*3/uL — ABNORMAL HIGH (ref 4.0–10.5)
nRBC: 0 % (ref 0.0–0.2)

## 2023-02-21 LAB — URINALYSIS, ROUTINE W REFLEX MICROSCOPIC
Bilirubin Urine: NEGATIVE
Glucose, UA: >=500 mg/dL — AB
Hgb urine dipstick: NEGATIVE
Ketones, ur: 5 mg/dL — AB
Leukocytes,Ua: NEGATIVE
Nitrite: NEGATIVE
Protein, ur: >=300 mg/dL — AB
Specific Gravity, Urine: 1.014 (ref 1.005–1.030)
pH: 5 (ref 5.0–8.0)

## 2023-02-21 LAB — URIC ACID: Uric Acid, Serum: 5.5 mg/dL (ref 2.5–7.1)

## 2023-02-21 LAB — BRAIN NATRIURETIC PEPTIDE: B Natriuretic Peptide: 156.1 pg/mL — ABNORMAL HIGH (ref 0.0–100.0)

## 2023-02-21 LAB — GLUCOSE, CAPILLARY
Glucose-Capillary: 208 mg/dL — ABNORMAL HIGH (ref 70–99)
Glucose-Capillary: 239 mg/dL — ABNORMAL HIGH (ref 70–99)
Glucose-Capillary: 320 mg/dL — ABNORMAL HIGH (ref 70–99)
Glucose-Capillary: 367 mg/dL — ABNORMAL HIGH (ref 70–99)
Glucose-Capillary: 411 mg/dL — ABNORMAL HIGH (ref 70–99)
Glucose-Capillary: 347 mg/dL — ABNORMAL HIGH (ref 70–99)

## 2023-02-21 LAB — PROCALCITONIN: Procalcitonin: 0.82 ng/mL

## 2023-02-21 LAB — C-REACTIVE PROTEIN: CRP: 25.1 mg/dL — ABNORMAL HIGH (ref <1.0)

## 2023-02-21 LAB — OSMOLALITY: Osmolality: 324 mosm/kg (ref 275–295)

## 2023-02-21 LAB — MAGNESIUM: Magnesium: 1.8 mg/dL (ref 1.7–2.4)

## 2023-02-21 MED ORDER — LACTATED RINGERS IV SOLN: INTRAVENOUS | Status: AC

## 2023-02-21 MED ORDER — INSULIN ASPART 100 UNIT/ML IJ SOLN
3.0000 [IU] | Freq: Three times a day (TID) | INTRAMUSCULAR | Status: DC
  Administered 2023-02-21 (×3): 3 [IU] via SUBCUTANEOUS

## 2023-02-21 MED ORDER — INSULIN ASPART 100 UNIT/ML IJ SOLN
0.0000 [IU] | Freq: Every day | INTRAMUSCULAR | Status: DC
  Administered 2023-02-21: 4 [IU] via SUBCUTANEOUS

## 2023-02-21 MED ORDER — SODIUM CHLORIDE 0.9 % IV BOLUS
750.0000 mL | Freq: Once | INTRAVENOUS | Status: AC
  Administered 2023-02-21: 750 mL via INTRAVENOUS

## 2023-02-21 MED ORDER — INSULIN ASPART 100 UNIT/ML IJ SOLN
0.0000 [IU] | Freq: Three times a day (TID) | INTRAMUSCULAR | Status: DC
  Administered 2023-02-21: 11 [IU] via SUBCUTANEOUS
  Administered 2023-02-21: 5 [IU] via SUBCUTANEOUS
  Administered 2023-02-21: 17 [IU] via SUBCUTANEOUS

## 2023-02-21 MED ORDER — INSULIN GLARGINE-YFGN 100 UNIT/ML ~~LOC~~ SOLN
15.0000 [IU] | Freq: Two times a day (BID) | SUBCUTANEOUS | Status: DC
  Administered 2023-02-21: 15 [IU] via SUBCUTANEOUS
  Filled 2023-02-21 (×2): qty 0.15

## 2023-02-21 MED ORDER — NALOXONE HCL 0.4 MG/ML IJ SOLN: 0.4000 mg | INTRAMUSCULAR | Status: DC | PRN

## 2023-02-21 MED ORDER — INSULIN GLARGINE-YFGN 100 UNIT/ML ~~LOC~~ SOLN
25.0000 [IU] | Freq: Two times a day (BID) | SUBCUTANEOUS | Status: DC
  Administered 2023-02-21: 25 [IU] via SUBCUTANEOUS
  Filled 2023-02-21 (×2): qty 0.25

## 2023-02-21 NOTE — Consult Note (Signed)
Renal Service Consult Note [facility]  Angela Castro 02/21/2023 Angela Krabbe, MD Requesting Physician: Dr. Thedore Mins, Angela Castro.   Reason for Consult: Renal failure HPI: The patient is a 82 y.o. year old w/ PMH of DM2, diast CHF, CKD 3, gout, neuropathy who presented 10/10 w/ swelling and redness of 3rd finger on the R hand. Drainage also. Pt was seen and admitted for cellulitis/ abscess of the R 3rd digit w/ possible osteomyelitis. IV abx were started. Hand surgery consulted. Pt underwent R 3rd finger partial amputation, I&D of tendon sheath and deep spaces of palm and open carpal tunnel release yesterday 10/11. B/l creat was 2.6. Here the creat was 3.7 on admission and up to 4.4 today. IV vanc was dc'd and zyvox started. We are asked to see for renal failure.   Pt seen in room.  She and her family state that she will not take dialysis, and she has said this before to her doctor's and family. She is f/b Dr Malen Gauze at [facility].    Pt denies any SOB, leg swelling. No CP or abd pain.   ROS - denies CP, no joint pain, no HA, no blurry vision, no rash, no diarrhea, no nausea/ vomiting, no dysuria, no difficulty voiding   Past Medical History  Past Medical History:  Diagnosis Date   Asthma    Diabetes mellitus without complication (HCC)    Hypertension    Past Surgical History  Past Surgical History:  Procedure Laterality Date   AMPUTATION Left 08/26/2016   Procedure: AMPUTATION RAY/1ST;  Surgeon: Toni Arthurs, MD;  Location: [facility] OR;  Service: Orthopedics;  Laterality: Left;   IR GENERIC HISTORICAL  07/30/2016   IR US GUIDE VASC ACCESS RIGHT 07/30/2016 Richarda Overlie, MD [facility]INTER[facility]   IR GENERIC HISTORICAL  07/30/2016   IR FLUORO GUIDE CV MIDLINE PICC RIGHT 07/30/2016 Richarda Overlie, MD [facility]   KNEE ARTHROSCOPY     x 4   ORIF FEMUR FRACTURE Right 03/25/2021   Procedure: OPEN REDUCTION INTERNAL FIXATION (ORIF) DISTAL FEMUR FRACTURE;  Surgeon: Roby Lofts, MD;  Location: [facility] OR;   Service: Orthopedics;  Laterality: Right;   REPLACEMENT TOTAL KNEE     Family History  Family History  Problem Relation Age of Onset   Heart disease Mother    Social History  reports that she has never smoked. She has never used smokeless tobacco. She reports that she does not drink alcohol and does not use drugs. Allergies  Allergies  Allergen Reactions   Sulfa Antibiotics Shortness Of Breath and Rash   Adhesive [Tape] Other (See Comments)    Causes redness, please use paper tape   Keflex [Cephalexin] Other (See Comments)    Possibly caused lethargy (taken with Flagyl)   Lisinopril Cough   Metronidazole Other (See Comments)    Possibly caused lethargy (taken with keflex)   Teflaro [Ceftaroline] Other (See Comments)    Lethargy, possibly rash   Home medications Prior to Admission medications   Medication Sig Start Date End Date Taking? Authorizing Provider  allopurinol (ZYLOPRIM) 100 MG tablet Take 100 mg by mouth 2 (two) times daily.    Yes [provider]  amLODipine (NORVASC) 5 MG tablet Take 5 mg by mouth every morning.   Yes [provider]  bisoprolol (ZEBETA) 10 MG tablet Take 10 mg by mouth in the morning.   Yes [provider]  bumetanide (BUMEX) 1 MG tablet Take 1 tablet (1 mg total) by mouth daily. 01/16/23  Yes Patwardhan, Manish J, MD  Calcium Carbonate-Vitamin D (CALCIUM-D) 600-400 MG-UNIT TABS Take 1 tablet by mouth every morning.   Yes [provider]  cholecalciferol (VITAMIN D3) 25 MCG (1000 UNIT) tablet Take 1,000 Units by mouth daily.   Yes [provider]  clobetasol (TEMOVATE) 0.05 % external solution Apply 1 application topically daily as needed (scalp itching/psoriasis).   Yes [provider]  ferrous sulfate (FERROUSUL) 325 (65 FE) MG tablet Take 1 tablet (325 mg total) by mouth 2 (two) times daily with a meal. 09/03/16  Yes Zannie Cove, MD  fluocinonide cream (LIDEX) 0.05 % Apply 1 application topically  2 (two) times daily as needed (psoriasis).    Yes [provider]  gabapentin (NEURONTIN) 400 MG capsule Take 400 mg by mouth 2 (two) times daily.   Yes [provider]  glipiZIDE (GLUCOTROL XL) 5 MG 24 hr tablet Take 5 mg by mouth daily with breakfast.   Yes [provider]  insulin glargine (LANTUS SOLOSTAR) 100 UNIT/ML Solostar Pen Inject 50 Units into the skin at bedtime. Patient taking differently: Inject 56 Units into the skin at bedtime. 03/28/21  Yes Dahal, Melina Schools, MD  loratadine (CLARITIN) 10 MG tablet Take 10 mg by mouth every morning.   Yes [provider]  losartan (COZAAR) 25 MG tablet Take 25 mg by mouth every morning. 02/02/21  Yes [provider]  Multiple Vitamins-Iron (MULTIVITAMIN/IRON PO) Take 1 tablet by mouth every morning.   Yes [provider]  simvastatin (ZOCOR) 5 MG tablet Take 5 mg by mouth at bedtime.    Yes [provider]  sitaGLIPtin (JANUVIA) 50 MG tablet Take 50 mg by mouth every morning.   Yes [provider]  traMADol (ULTRAM) 50 MG tablet Take 50 mg by mouth every 6 (six) hours as needed for moderate pain. 12/24/20  Yes [provider]  vitamin C (ASCORBIC ACID) 500 MG tablet Take 500 mg by mouth every morning.   Yes [provider]  gabapentin (NEURONTIN) 300 MG capsule Take 1 capsule (300 mg total) by mouth 2 (two) times daily. Patient not taking: Reported on 02/19/2023 03/28/21   Lorin Glass, MD     Vitals:   02/21/23 0000 02/21/23 0405 02/21/23 0836 02/21/23 1200  BP: 118/66  (!) 130/59 128/62  Pulse: 86 87 95 96  Resp: 19 15 13 12   Temp: 98.4 F (36.9 C)   97.7 F (36.5 C)  TempSrc: Oral   Axillary  SpO2: 93% 95% 93%   Weight:      Height:       Exam Gen alert, no distress, elderly WF No rash, cyanosis or gangrene Sclera anicteric, throat clear  No jvd or bruits Chest clear bilat to bases, no rales/ wheezing RRR no RG Abd soft ntnd no mass or ascites  +bs GU defer MS R hand is wrapped post-op Ext no LE or UE edema, no wounds or ulcers Neuro is alert, Ox 3 , nf    Renal-related home meds: - gabapentin 400 bid - norvasc 5 - bisoprolol 10 every day - bumex 1mg  every day - losartan 25 every day - others: sitagliptin, tramadol, zocor, insulin glargine, glipizide, zyloprim        Date  Creat   eGFR     Feb 2018  2.02 September 2016  3.75 >> 1.62     Nov 2022  1.53- 2.20 Dec 2022  2.55  18  ml/min    02/18/23  3.73  12 ml/min     10/10  3.84      10/11  3.82      02/21/23  4.40  10      UNa 27,  UCr 119     Renal US -  9/ 9.2 cm kidneys w/o hydro, ^'d echo on R, nl echo on L     UA pending      LR at 100 cc/hr     BP's 110- 140 / 48- 64, HR 80, RR 18  temp afeb  2L Cassandra 95%     Na 140  K 4.5  CO2 21  BUN 51  creat 4.40  Ca 8.3  alb 3.1, 1.9    AST/ ALT okay, tbili 0.8     BNP 156  CK 576   CRP  ^25   LA 0.8      PC 0.82   WBC 18 >> 17K today     Hb 8.1   Assessment/ Plan: AKI on CKD 4 - b/l creat 2.55 from August 2024, eGFR 18 ml/min. Followed by Dr Malen Gauze at [facility]. She does not want dialysis no matter what the circumstances. Creat here was 3.7 on admission in the setting of new cellulitis/abscess of her R hand/ 3rd finger. She underwent surgery/ I&D on the had yesterday, 10/11.  UA is pending, renal US unremarkable. Pt was taking ARB at home, on hold here. On exam is euvolemic or possibly dry. No vol excess. BP's are good. No signs of uremia. Suspect AKI due to pre-renal / volume depletion. Will cont IVF's at 100 cc/hr, and bolus 750 cc NS. Order UA. Will follow.  Cellulitis / abscess - of the R hand, sp I&D/ partial amputation on 10/11 DM2 - per pmd     Vinson Moselle  MD [facility] 02/21/2023, 3:06 PM  Recent Labs  Lab 02/19/23 1651 02/19/23 2139 02/20/23 0300 02/21/23 0245  HGB 9.0*  --  7.7* 8.1*  ALBUMIN 3.1*  --  1.9*  --   CALCIUM 9.0  --  8.4* 8.3*  PHOS  --  4.3 4.3  --   CREATININE 3.84*  --  3.82* 4.40*  K 4.1   --  4.1 4.5   Inpatient medications:  allopurinol  100 mg Oral BID   bisoprolol  10 mg Oral Daily   gabapentin  400 mg Oral BID   insulin aspart  0-15 Units Subcutaneous TID WC   insulin aspart  0-5 Units Subcutaneous QHS   insulin aspart  3 Units Subcutaneous TID WC   insulin glargine-yfgn  15 Units Subcutaneous BID   linezolid  600 mg Oral BID   multivitamin with minerals  1 tablet Oral Daily   nutrition supplement (JUVEN)  1 packet Oral BID BM   simvastatin  5 mg Oral QHS    ceFEPime (MAXIPIME) IV 2 g (02/20/23 0108)   lactated ringers 100 mL/hr at 02/21/23 0951   acetaminophen **OR** acetaminophen, HYDROcodone-acetaminophen, naLOXone (NARCAN)  injection, ondansetron **OR** ondansetron (ZOFRAN) IV

## 2023-02-21 NOTE — Progress Notes (Signed)
Subjective: 1 Day Post-Op Procedure(s) (LRB): IRRIGATION AND DEBRIDEMENT vs. AMPUTATION RIGHT LONG FINGER (Right)  Patient doing well today.  Reports her hand feels better since surgery.  Objective: Vital signs in last 24 hours: Temp:  [97.4 F (36.3 C)-99.3 F (37.4 C)] 97.7 F (36.5 C) (10/12 1200) Pulse Rate:  [75-96] 96 (10/12 1200) Resp:  [12-20] 12 (10/12 1200) BP: (89-140)/(57-88) 128/62 (10/12 1200) SpO2:  [93 %-100 %] 93 % (10/12 0836)  Intake/Output from previous day: 10/11 0701 - 10/12 0700 In: 200 [I.V.:200] Out: 10 [Blood:10] Intake/Output this shift: Total I/O In: -  Out: 200 [Urine:200]  Recent Labs    02/18/23 1600 02/19/23 1651 02/20/23 0300 02/21/23 0245  HGB 8.9* 9.0* 7.7* 8.1*   Recent Labs    02/20/23 0300 02/21/23 0245  WBC 16.5* 17.2*  RBC 2.56* 2.59*  HCT 24.1* 25.0*  PLT 291 277   Recent Labs    02/20/23 0300 02/21/23 0245  NA 140 140  K 4.1 4.5  CL 103 107  CO2 21* 21*  BUN 49* 51*  CREATININE 3.82* 4.40*  GLUCOSE 157* 197*  CALCIUM 8.4* 8.3*   Recent Labs    02/20/23 0300  INR 1.5*    Exam: Bandages clean, dry, and intact. Able to wiggle free fingers.   Assessment/Plan: 1 Day Post-Op Procedure(s) (LRB): IRRIGATION AND DEBRIDEMENT vs. AMPUTATION RIGHT LONG FINGER (Right)  -Keep on broad spectrum IV abx until cultures return -Recommend ID consult as patient will likely need 3-6 weeks of antibiotics given exposed bone and other bilateral finger ulcerations. -No weight bearing on RUE -Keep dressings on unless soiled  Angela Castro 02/21/2023, 3:01 PM [phone]

## 2023-02-21 NOTE — Progress Notes (Addendum)
Result Value Ref Range Status   Specimen Description BLOOD LEFT ANTECUBITAL  Final   Special Requests   Final    BOTTLES DRAWN AEROBIC AND ANAEROBIC Blood Culture adequate volume   Culture   Final    NO GROWTH < 12 HOURS Performed at [facility], [address], [place], [place] [address]    Report Status PENDING  Incomplete  Aerobic/Anaerobic Culture w Gram Stain (surgical/deep wound)     Status: None (Preliminary result)   Collection Time: 02/20/23 10:00 PM   Specimen: Wound; Body Fluid  Result Value Ref Range Status   Specimen Description WOUND  Final   Special Requests BONE FINGER  Final   Gram Stain   Final    NO WBC SEEN NO ORGANISMS SEEN Performed at [facility], [address] [place], [place] [address]    Culture PENDING  Incomplete   Report Status PENDING  Incomplete  Aerobic/Anaerobic Culture w Gram Stain (surgical/deep wound)     Status: None (Preliminary result)   Collection Time: 02/20/23 10:00 PM   Specimen: Wound; Body Fluid  Result Value Ref Range Status   Specimen Description WOUND  Final   Special Requests FINGER  Final   Gram Stain   Final    FEW WBC  PRESENT,BOTH PMN AND MONONUCLEAR FEW GRAM NEGATIVE RODS FEW GRAM POSITIVE COCCI IN PAIRS Performed at [facility], [address] [place], [place] [address]    Culture PENDING  Incomplete   Report Status PENDING  Incomplete  Aerobic/Anaerobic Culture w Gram Stain (surgical/deep wound)     Status: None (Preliminary result)   Collection Time: 02/20/23 10:01 PM   Specimen: PATH Bone biopsy; Tissue  Result Value Ref Range Status   Specimen Description WOUND  Final   Special Requests FINGER  Final   Gram Stain   Final    MODERATE WBC PRESENT,BOTH PMN AND MONONUCLEAR FEW GRAM NEGATIVE RODS FEW GRAM POSITIVE COCCI IN PAIRS Performed at [facility], [address], [place], [place] [address]    Culture PENDING  Incomplete   Report Status PENDING  Incomplete    Radiology Reports VAS [place] UPPER EXTREMITY ARTERIAL DUPLEX  Result Date: 02/20/2023  UPPER EXTREMITY DUPLEX STUDY Patient Name:  Angela Castro  Date of Exam:   02/20/2023 Medical Rec #: [id number]       Accession #:    [id number] Date of Birth: 24-Apr-1941       Patient Gender: F Patient Age:   83 years Exam Location:  [facility] Procedure:      VAS [place] UPPER EXTREMITY ARTERIAL DUPLEX Referring Phys: Graceann Congress --------------------------------------------------------------------------------  Indications: Increased swelling, redness and drainage from her right middle              finger. No pain associated with her fingers.  Risk Factors:  Hypertension, hyperlipidemia, Diabetes. Other Factors: Long history of multiple finger infections and prior auto                amputation of several digits. Performing Technologist: Marilynne Halsted RDMS, RVT  Examination Guidelines: A complete evaluation includes B-mode imaging, spectral Doppler, color Doppler, and power Doppler as needed of all accessible portions of each vessel. Bilateral testing is considered an integral part of a complete examination. Limited examinations for  reoccurring indications may be performed as noted.  Right Doppler Findings: +---------------+----------+--------+--------+--------+ Site           PSV (cm/s)WaveformStenosisComments +---------------+----------+--------+--------+--------+ Subclavian Prox125  Result Value Ref Range Status   Specimen Description BLOOD LEFT ANTECUBITAL  Final   Special Requests   Final    BOTTLES DRAWN AEROBIC AND ANAEROBIC Blood Culture adequate volume   Culture   Final    NO GROWTH < 12 HOURS Performed at [facility], [address], [place], [place] [address]    Report Status PENDING  Incomplete  Aerobic/Anaerobic Culture w Gram Stain (surgical/deep wound)     Status: None (Preliminary result)   Collection Time: 02/20/23 10:00 PM   Specimen: Wound; Body Fluid  Result Value Ref Range Status   Specimen Description WOUND  Final   Special Requests BONE FINGER  Final   Gram Stain   Final    NO WBC SEEN NO ORGANISMS SEEN Performed at [facility], [address] [place], [place] [address]    Culture PENDING  Incomplete   Report Status PENDING  Incomplete  Aerobic/Anaerobic Culture w Gram Stain (surgical/deep wound)     Status: None (Preliminary result)   Collection Time: 02/20/23 10:00 PM   Specimen: Wound; Body Fluid  Result Value Ref Range Status   Specimen Description WOUND  Final   Special Requests FINGER  Final   Gram Stain   Final    FEW WBC  PRESENT,BOTH PMN AND MONONUCLEAR FEW GRAM NEGATIVE RODS FEW GRAM POSITIVE COCCI IN PAIRS Performed at [facility], [address] [place], [place] [address]    Culture PENDING  Incomplete   Report Status PENDING  Incomplete  Aerobic/Anaerobic Culture w Gram Stain (surgical/deep wound)     Status: None (Preliminary result)   Collection Time: 02/20/23 10:01 PM   Specimen: PATH Bone biopsy; Tissue  Result Value Ref Range Status   Specimen Description WOUND  Final   Special Requests FINGER  Final   Gram Stain   Final    MODERATE WBC PRESENT,BOTH PMN AND MONONUCLEAR FEW GRAM NEGATIVE RODS FEW GRAM POSITIVE COCCI IN PAIRS Performed at [facility], [address], [place], [place] [address]    Culture PENDING  Incomplete   Report Status PENDING  Incomplete    Radiology Reports VAS [place] UPPER EXTREMITY ARTERIAL DUPLEX  Result Date: 02/20/2023  UPPER EXTREMITY DUPLEX STUDY Patient Name:  Angela Castro  Date of Exam:   02/20/2023 Medical Rec #: [id number]       Accession #:    [id number] Date of Birth: 24-Apr-1941       Patient Gender: F Patient Age:   83 years Exam Location:  [facility] Procedure:      VAS [place] UPPER EXTREMITY ARTERIAL DUPLEX Referring Phys: Graceann Congress --------------------------------------------------------------------------------  Indications: Increased swelling, redness and drainage from her right middle              finger. No pain associated with her fingers.  Risk Factors:  Hypertension, hyperlipidemia, Diabetes. Other Factors: Long history of multiple finger infections and prior auto                amputation of several digits. Performing Technologist: Marilynne Halsted RDMS, RVT  Examination Guidelines: A complete evaluation includes B-mode imaging, spectral Doppler, color Doppler, and power Doppler as needed of all accessible portions of each vessel. Bilateral testing is considered an integral part of a complete examination. Limited examinations for  reoccurring indications may be performed as noted.  Right Doppler Findings: +---------------+----------+--------+--------+--------+ Site           PSV (cm/s)WaveformStenosisComments +---------------+----------+--------+--------+--------+ Subclavian Prox125  PROGRESS NOTE                                                                                                                                                                                                             Patient Demographics:    Angela Castro, is a 82 y.o. female, DOB - 1941-02-10, MWU:[id number]  Outpatient Primary MD for the patient is Farris Has, MD    LOS - 2  Admit date - 02/19/2023    Chief Complaint  Patient presents with   hand swelling       Brief Narrative (HPI from H&P)   82 y.o. female with medical history significant of DM2, diastolic CHF,  CKD 3, IDA, gout, neuropathy. Presented with  right hand 3rd finger swelling and redness.   Presents with a right hand third finger swelling redness was at wound care yesterday and was told to go to emergency department has history of wounds that required an amputation with underlying history of diabetes there is some drainage of a right hand finger, purulent drainage for the past 3 days. She was originally seen at [facility], ER but left without being seen by provider.  She was diagnosed with severe right hand cellulitis and possible abscess and admitted to the hospital.   Subjective:   Patient in bed, appears comfortable, denies any headache, no fever, no chest pain or pressure, no shortness of breath , no abdominal pain. No new focal weakness.   Assessment  & Plan :    Cellulitis, abscess of the right third digit with possible underlying osteomyelitis.  Her infection has been ongoing for the last several days, he has evidence of sepsis upon admission, has been placed on appropriate IV antibiotics, type screen done, MRSA nasal PCR so far is negative.  Hand surgery consulted, MRI noted, patient underwent Right long finger partial amputation, I&D of flexor tendon sheath and deep spaces of palm & Open carpal tunnel release on 02/20/2023 by Ramon Dredge MD, continue antibiotics follow cultures.  PT OT.  Chronic diastolic CHF EF 60% on echocardiogram done 2 years ago.  Currently compensated, n.p.o. due to above may require OR, gentle IV fluids,   AKI on CKD 4.  Baseline creatinine around 2.6.  Hold nephrotoxins, switch vancomycin to Zyvox and monitor.  She has seen nephrologist in the outpatient  Result Value Ref Range Status   Specimen Description BLOOD LEFT ANTECUBITAL  Final   Special Requests   Final    BOTTLES DRAWN AEROBIC AND ANAEROBIC Blood Culture adequate volume   Culture   Final    NO GROWTH < 12 HOURS Performed at [facility], [address], [place], [place] [address]    Report Status PENDING  Incomplete  Aerobic/Anaerobic Culture w Gram Stain (surgical/deep wound)     Status: None (Preliminary result)   Collection Time: 02/20/23 10:00 PM   Specimen: Wound; Body Fluid  Result Value Ref Range Status   Specimen Description WOUND  Final   Special Requests BONE FINGER  Final   Gram Stain   Final    NO WBC SEEN NO ORGANISMS SEEN Performed at [facility], [address] [place], [place] [address]    Culture PENDING  Incomplete   Report Status PENDING  Incomplete  Aerobic/Anaerobic Culture w Gram Stain (surgical/deep wound)     Status: None (Preliminary result)   Collection Time: 02/20/23 10:00 PM   Specimen: Wound; Body Fluid  Result Value Ref Range Status   Specimen Description WOUND  Final   Special Requests FINGER  Final   Gram Stain   Final    FEW WBC  PRESENT,BOTH PMN AND MONONUCLEAR FEW GRAM NEGATIVE RODS FEW GRAM POSITIVE COCCI IN PAIRS Performed at [facility], [address] [place], [place] [address]    Culture PENDING  Incomplete   Report Status PENDING  Incomplete  Aerobic/Anaerobic Culture w Gram Stain (surgical/deep wound)     Status: None (Preliminary result)   Collection Time: 02/20/23 10:01 PM   Specimen: PATH Bone biopsy; Tissue  Result Value Ref Range Status   Specimen Description WOUND  Final   Special Requests FINGER  Final   Gram Stain   Final    MODERATE WBC PRESENT,BOTH PMN AND MONONUCLEAR FEW GRAM NEGATIVE RODS FEW GRAM POSITIVE COCCI IN PAIRS Performed at [facility], [address], [place], [place] [address]    Culture PENDING  Incomplete   Report Status PENDING  Incomplete    Radiology Reports VAS [place] UPPER EXTREMITY ARTERIAL DUPLEX  Result Date: 02/20/2023  UPPER EXTREMITY DUPLEX STUDY Patient Name:  Angela Castro  Date of Exam:   02/20/2023 Medical Rec #: [id number]       Accession #:    [id number] Date of Birth: 24-Apr-1941       Patient Gender: F Patient Age:   83 years Exam Location:  [facility] Procedure:      VAS [place] UPPER EXTREMITY ARTERIAL DUPLEX Referring Phys: Graceann Congress --------------------------------------------------------------------------------  Indications: Increased swelling, redness and drainage from her right middle              finger. No pain associated with her fingers.  Risk Factors:  Hypertension, hyperlipidemia, Diabetes. Other Factors: Long history of multiple finger infections and prior auto                amputation of several digits. Performing Technologist: Marilynne Halsted RDMS, RVT  Examination Guidelines: A complete evaluation includes B-mode imaging, spectral Doppler, color Doppler, and power Doppler as needed of all accessible portions of each vessel. Bilateral testing is considered an integral part of a complete examination. Limited examinations for  reoccurring indications may be performed as noted.  Right Doppler Findings: +---------------+----------+--------+--------+--------+ Site           PSV (cm/s)WaveformStenosisComments +---------------+----------+--------+--------+--------+ Subclavian Prox125  Result Value Ref Range Status   Specimen Description BLOOD LEFT ANTECUBITAL  Final   Special Requests   Final    BOTTLES DRAWN AEROBIC AND ANAEROBIC Blood Culture adequate volume   Culture   Final    NO GROWTH < 12 HOURS Performed at [facility], [address], [place], [place] [address]    Report Status PENDING  Incomplete  Aerobic/Anaerobic Culture w Gram Stain (surgical/deep wound)     Status: None (Preliminary result)   Collection Time: 02/20/23 10:00 PM   Specimen: Wound; Body Fluid  Result Value Ref Range Status   Specimen Description WOUND  Final   Special Requests BONE FINGER  Final   Gram Stain   Final    NO WBC SEEN NO ORGANISMS SEEN Performed at [facility], [address] [place], [place] [address]    Culture PENDING  Incomplete   Report Status PENDING  Incomplete  Aerobic/Anaerobic Culture w Gram Stain (surgical/deep wound)     Status: None (Preliminary result)   Collection Time: 02/20/23 10:00 PM   Specimen: Wound; Body Fluid  Result Value Ref Range Status   Specimen Description WOUND  Final   Special Requests FINGER  Final   Gram Stain   Final    FEW WBC  PRESENT,BOTH PMN AND MONONUCLEAR FEW GRAM NEGATIVE RODS FEW GRAM POSITIVE COCCI IN PAIRS Performed at [facility], [address] [place], [place] [address]    Culture PENDING  Incomplete   Report Status PENDING  Incomplete  Aerobic/Anaerobic Culture w Gram Stain (surgical/deep wound)     Status: None (Preliminary result)   Collection Time: 02/20/23 10:01 PM   Specimen: PATH Bone biopsy; Tissue  Result Value Ref Range Status   Specimen Description WOUND  Final   Special Requests FINGER  Final   Gram Stain   Final    MODERATE WBC PRESENT,BOTH PMN AND MONONUCLEAR FEW GRAM NEGATIVE RODS FEW GRAM POSITIVE COCCI IN PAIRS Performed at [facility], [address], [place], [place] [address]    Culture PENDING  Incomplete   Report Status PENDING  Incomplete    Radiology Reports VAS [place] UPPER EXTREMITY ARTERIAL DUPLEX  Result Date: 02/20/2023  UPPER EXTREMITY DUPLEX STUDY Patient Name:  Angela Castro  Date of Exam:   02/20/2023 Medical Rec #: [id number]       Accession #:    [id number] Date of Birth: 24-Apr-1941       Patient Gender: F Patient Age:   83 years Exam Location:  [facility] Procedure:      VAS [place] UPPER EXTREMITY ARTERIAL DUPLEX Referring Phys: Graceann Congress --------------------------------------------------------------------------------  Indications: Increased swelling, redness and drainage from her right middle              finger. No pain associated with her fingers.  Risk Factors:  Hypertension, hyperlipidemia, Diabetes. Other Factors: Long history of multiple finger infections and prior auto                amputation of several digits. Performing Technologist: Marilynne Halsted RDMS, RVT  Examination Guidelines: A complete evaluation includes B-mode imaging, spectral Doppler, color Doppler, and power Doppler as needed of all accessible portions of each vessel. Bilateral testing is considered an integral part of a complete examination. Limited examinations for  reoccurring indications may be performed as noted.  Right Doppler Findings: +---------------+----------+--------+--------+--------+ Site           PSV (cm/s)WaveformStenosisComments +---------------+----------+--------+--------+--------+ Subclavian Prox125  PROGRESS NOTE                                                                                                                                                                                                             Patient Demographics:    Angela Castro, is a 82 y.o. female, DOB - 1941-02-10, MWU:[id number]  Outpatient Primary MD for the patient is Farris Has, MD    LOS - 2  Admit date - 02/19/2023    Chief Complaint  Patient presents with   hand swelling       Brief Narrative (HPI from H&P)   82 y.o. female with medical history significant of DM2, diastolic CHF,  CKD 3, IDA, gout, neuropathy. Presented with  right hand 3rd finger swelling and redness.   Presents with a right hand third finger swelling redness was at wound care yesterday and was told to go to emergency department has history of wounds that required an amputation with underlying history of diabetes there is some drainage of a right hand finger, purulent drainage for the past 3 days. She was originally seen at [facility], ER but left without being seen by provider.  She was diagnosed with severe right hand cellulitis and possible abscess and admitted to the hospital.   Subjective:   Patient in bed, appears comfortable, denies any headache, no fever, no chest pain or pressure, no shortness of breath , no abdominal pain. No new focal weakness.   Assessment  & Plan :    Cellulitis, abscess of the right third digit with possible underlying osteomyelitis.  Her infection has been ongoing for the last several days, he has evidence of sepsis upon admission, has been placed on appropriate IV antibiotics, type screen done, MRSA nasal PCR so far is negative.  Hand surgery consulted, MRI noted, patient underwent Right long finger partial amputation, I&D of flexor tendon sheath and deep spaces of palm & Open carpal tunnel release on 02/20/2023 by Ramon Dredge MD, continue antibiotics follow cultures.  PT OT.  Chronic diastolic CHF EF 60% on echocardiogram done 2 years ago.  Currently compensated, n.p.o. due to above may require OR, gentle IV fluids,   AKI on CKD 4.  Baseline creatinine around 2.6.  Hold nephrotoxins, switch vancomycin to Zyvox and monitor.  She has seen nephrologist in the outpatient  PROGRESS NOTE                                                                                                                                                                                                             Patient Demographics:    Angela Castro, is a 82 y.o. female, DOB - 1941-02-10, MWU:[id number]  Outpatient Primary MD for the patient is Farris Has, MD    LOS - 2  Admit date - 02/19/2023    Chief Complaint  Patient presents with   hand swelling       Brief Narrative (HPI from H&P)   82 y.o. female with medical history significant of DM2, diastolic CHF,  CKD 3, IDA, gout, neuropathy. Presented with  right hand 3rd finger swelling and redness.   Presents with a right hand third finger swelling redness was at wound care yesterday and was told to go to emergency department has history of wounds that required an amputation with underlying history of diabetes there is some drainage of a right hand finger, purulent drainage for the past 3 days. She was originally seen at [facility], ER but left without being seen by provider.  She was diagnosed with severe right hand cellulitis and possible abscess and admitted to the hospital.   Subjective:   Patient in bed, appears comfortable, denies any headache, no fever, no chest pain or pressure, no shortness of breath , no abdominal pain. No new focal weakness.   Assessment  & Plan :    Cellulitis, abscess of the right third digit with possible underlying osteomyelitis.  Her infection has been ongoing for the last several days, he has evidence of sepsis upon admission, has been placed on appropriate IV antibiotics, type screen done, MRSA nasal PCR so far is negative.  Hand surgery consulted, MRI noted, patient underwent Right long finger partial amputation, I&D of flexor tendon sheath and deep spaces of palm & Open carpal tunnel release on 02/20/2023 by Ramon Dredge MD, continue antibiotics follow cultures.  PT OT.  Chronic diastolic CHF EF 60% on echocardiogram done 2 years ago.  Currently compensated, n.p.o. due to above may require OR, gentle IV fluids,   AKI on CKD 4.  Baseline creatinine around 2.6.  Hold nephrotoxins, switch vancomycin to Zyvox and monitor.  She has seen nephrologist in the outpatient

## 2023-02-21 NOTE — Progress Notes (Signed)
OT Cancellation Note  Patient Details Name: Angela Castro MRN: [id number] DOB: [birth year]   Cancelled Treatment:    Reason Eval/Treat Not Completed: Patient declined, no reason specified. On the way to cotx session PT mentioned that Patient adamant that she cannot work with PT or OT today. Begins crying stating she has had "one hundred people in and out of here today." Attempted to persuade her by discussing risks of inactivity. She reports she did walk to the bathroom earlier (RN reports is was only to [facility]). Still crying and refused for the rest of the day.   02/21/2023  AB, OTR/L  Acute Rehabilitation Services  Office: [phone]  Tristan Schroeder 02/21/2023, 3:44 PM

## 2023-02-21 NOTE — Progress Notes (Signed)
PT Cancellation Note  Patient Details Name: Tamiya Colello MRN: [id number] DOB: [birth year]   Cancelled Treatment:    Reason Eval/Treat Not Completed: Patient at procedure or test/unavailable  Patient just starting breakfast and family reports multiple interruptions while trying to eat. Will return later today.   Jerolyn Center, PT Acute Rehabilitation Services  Office [phone]  [name] 02/21/2023, 9:59 AM

## 2023-02-21 NOTE — Progress Notes (Signed)
PT Cancellation Note  Patient Details Name: Angela Castro MRN: [id number] DOB: 10/28/1940   Cancelled Treatment:    Reason Eval/Treat Not Completed: Other (comment)  Patient adamant that she cannot work with PT or OT today. Begins crying stating she has had "one hundred people in and out of here today." Attempted to persuade her by discussing risks of inactivity. She reports she did walk to the bathroom earlier (RN reports is was only to [facility]). Still crying and refused for the rest of the day.    Jerolyn Center, PT Acute Rehabilitation Services  Office [phone]  [name] 02/21/2023, 2:18 PM

## 2023-02-21 NOTE — Plan of Care (Signed)
Problem: Education: Goal: Knowledge of General Education information will improve Description: Including pain rating scale, medication(s)/side effects and non-pharmacologic comfort measures Outcome: Progressing   Problem: Clinical Measurements: Goal: Will remain free from infection Outcome: Progressing Goal: Diagnostic test results will improve Outcome: Progressing   Problem: Activity: Goal: Risk for activity intolerance will decrease Outcome: Progressing   Problem: Nutrition: Goal: Adequate nutrition will be maintained Outcome: Progressing   Problem: Coping: Goal: Level of anxiety will decrease Outcome: Progressing

## 2023-02-22 ENCOUNTER — Inpatient Hospital Stay (HOSPITAL_COMMUNITY): Payer: Medicare PPO

## 2023-02-22 DIAGNOSIS — M86141 Other acute osteomyelitis, right hand: Secondary | ICD-10-CM | POA: Diagnosis not present

## 2023-02-22 LAB — GLUCOSE, CAPILLARY
Glucose-Capillary: 272 mg/dL — ABNORMAL HIGH (ref 70–99)
Glucose-Capillary: 273 mg/dL — ABNORMAL HIGH (ref 70–99)
Glucose-Capillary: 291 mg/dL — ABNORMAL HIGH (ref 70–99)
Glucose-Capillary: 292 mg/dL — ABNORMAL HIGH (ref 70–99)
Glucose-Capillary: 328 mg/dL — ABNORMAL HIGH (ref 70–99)
Glucose-Capillary: 331 mg/dL — ABNORMAL HIGH (ref 70–99)
Glucose-Capillary: 371 mg/dL — ABNORMAL HIGH (ref 70–99)

## 2023-02-22 LAB — CBC WITH DIFFERENTIAL/PLATELET
Abs Immature Granulocytes: 0.13 10*3/uL — ABNORMAL HIGH (ref 0.00–0.07)
Basophils Absolute: 0 10*3/uL (ref 0.0–0.1)
Basophils Relative: 0 %
Eosinophils Absolute: 0 10*3/uL (ref 0.0–0.5)
Eosinophils Relative: 0 %
HCT: 25.4 % — ABNORMAL LOW (ref 36.0–46.0)
Hemoglobin: 8.4 g/dL — ABNORMAL LOW (ref 12.0–15.0)
Immature Granulocytes: 1 %
Lymphocytes Relative: 6 %
Lymphs Abs: 1 10*3/uL (ref 0.7–4.0)
MCH: 31 pg (ref 26.0–34.0)
MCHC: 33.1 g/dL (ref 30.0–36.0)
MCV: 93.7 fL (ref 80.0–100.0)
Monocytes Absolute: 0.4 10*3/uL (ref 0.1–1.0)
Monocytes Relative: 2 %
Neutro Abs: 15 10*3/uL — ABNORMAL HIGH (ref 1.7–7.7)
Neutrophils Relative %: 91 %
Platelets: 360 10*3/uL (ref 150–400)
RBC: 2.71 MIL/uL — ABNORMAL LOW (ref 3.87–5.11)
RDW: 13.4 % (ref 11.5–15.5)
WBC: 16.6 10*3/uL — ABNORMAL HIGH (ref 4.0–10.5)
nRBC: 0.1 % (ref 0.0–0.2)

## 2023-02-22 LAB — BRAIN NATRIURETIC PEPTIDE: B Natriuretic Peptide: 233.9 pg/mL — ABNORMAL HIGH (ref 0.0–100.0)

## 2023-02-22 LAB — BASIC METABOLIC PANEL
Anion gap: 9 (ref 5–15)
BUN: 79 mg/dL — ABNORMAL HIGH (ref 8–23)
CO2: 21 mmol/L — ABNORMAL LOW (ref 22–32)
Calcium: 8.4 mg/dL — ABNORMAL LOW (ref 8.9–10.3)
Chloride: 106 mmol/L (ref 98–111)
Creatinine, Ser: 4.07 mg/dL — ABNORMAL HIGH (ref 0.44–1.00)
GFR, Estimated: 11 mL/min — ABNORMAL LOW (ref >60)
Glucose, Bld: 320 mg/dL — ABNORMAL HIGH (ref 70–99)
Potassium: 4.4 mmol/L (ref 3.5–5.1)
Sodium: 136 mmol/L (ref 135–145)

## 2023-02-22 LAB — MAGNESIUM: Magnesium: 1.9 mg/dL (ref 1.7–2.4)

## 2023-02-22 LAB — C-REACTIVE PROTEIN: CRP: 20.6 mg/dL — ABNORMAL HIGH (ref <1.0)

## 2023-02-22 LAB — PROCALCITONIN: Procalcitonin: 0.83 ng/mL

## 2023-02-22 MED ORDER — GUAIFENESIN-DM 100-10 MG/5ML PO SYRP: 10.0000 mL | ORAL_SOLUTION | ORAL | Status: DC | PRN

## 2023-02-22 MED ORDER — INSULIN ASPART 100 UNIT/ML IJ SOLN: 0.0000 [IU] | Freq: Three times a day (TID) | INTRAMUSCULAR | Status: DC

## 2023-02-22 MED ORDER — SODIUM CHLORIDE 0.9 % IV BOLUS
500.0000 mL | Freq: Once | INTRAVENOUS | Status: AC
  Administered 2023-02-22: 500 mL via INTRAVENOUS

## 2023-02-22 MED ORDER — INSULIN GLARGINE-YFGN 100 UNIT/ML ~~LOC~~ SOLN
50.0000 [IU] | Freq: Every day | SUBCUTANEOUS | Status: DC
  Administered 2023-02-23: 50 [IU] via SUBCUTANEOUS
  Filled 2023-02-22 (×3): qty 0.5

## 2023-02-22 MED ORDER — INSULIN GLARGINE-YFGN 100 UNIT/ML ~~LOC~~ SOLN
20.0000 [IU] | Freq: Once | SUBCUTANEOUS | Status: AC
  Filled 2023-02-22: qty 0.2

## 2023-02-22 MED ORDER — HYDROCODONE-ACETAMINOPHEN 5-325 MG PO TABS
1.0000 | ORAL_TABLET | ORAL | Status: DC | PRN
  Administered 2023-02-22 – 2023-02-23 (×2): 1 via ORAL
  Filled 2023-02-22 (×2): qty 1

## 2023-02-22 MED ORDER — GABAPENTIN 100 MG PO CAPS
100.0000 mg | ORAL_CAPSULE | Freq: Two times a day (BID) | ORAL | Status: DC
  Administered 2023-02-22 – 2023-02-25 (×6): 100 mg via ORAL
  Filled 2023-02-22 (×6): qty 1

## 2023-02-22 MED ORDER — INSULIN ASPART 100 UNIT/ML IJ SOLN: 0.0000 [IU] | Freq: Every day | INTRAMUSCULAR | Status: DC

## 2023-02-22 MED ORDER — INSULIN GLARGINE-YFGN 100 UNIT/ML ~~LOC~~ SOLN
30.0000 [IU] | Freq: Two times a day (BID) | SUBCUTANEOUS | Status: DC
  Administered 2023-02-22: 30 [IU] via SUBCUTANEOUS
  Filled 2023-02-22: qty 0.3

## 2023-02-22 MED ORDER — LACTATED RINGERS IV SOLN: INTRAVENOUS | Status: DC

## 2023-02-22 MED ORDER — INSULIN GLARGINE-YFGN 100 UNIT/ML ~~LOC~~ SOLN
20.0000 [IU] | Freq: Once | SUBCUTANEOUS | Status: DC
  Administered 2023-02-22: 20 [IU] via SUBCUTANEOUS
  Filled 2023-02-22: qty 0.2

## 2023-02-22 MED ORDER — BENZONATATE 100 MG PO CAPS
100.0000 mg | ORAL_CAPSULE | Freq: Three times a day (TID) | ORAL | Status: DC | PRN
  Administered 2023-02-22 – 2023-02-24 (×4): 100 mg via ORAL
  Filled 2023-02-22 (×5): qty 1

## 2023-02-22 MED ORDER — INSULIN ASPART 100 UNIT/ML IJ SOLN: 5.0000 [IU] | Freq: Three times a day (TID) | INTRAMUSCULAR | Status: DC

## 2023-02-22 MED ORDER — INSULIN ASPART 100 UNIT/ML IJ SOLN
3.0000 [IU] | Freq: Three times a day (TID) | INTRAMUSCULAR | Status: DC
  Administered 2023-02-22 – 2023-02-23 (×6): 3 [IU] via SUBCUTANEOUS

## 2023-02-22 MED ORDER — DEXTROSE-SODIUM CHLORIDE 5-0.45 % IV SOLN: INTRAVENOUS | Status: DC

## 2023-02-22 MED ORDER — INSULIN GLARGINE-YFGN 100 UNIT/ML ~~LOC~~ SOLN
45.0000 [IU] | Freq: Every day | SUBCUTANEOUS | Status: DC
Start: 2023-02-22 — End: 2023-02-22

## 2023-02-22 MED ORDER — INSULIN ASPART 100 UNIT/ML IJ SOLN
0.0000 [IU] | Freq: Three times a day (TID) | INTRAMUSCULAR | Status: DC
  Administered 2023-02-22 (×2): 11 [IU] via SUBCUTANEOUS
  Administered 2023-02-22: 15 [IU] via SUBCUTANEOUS
  Administered 2023-02-23: 4 [IU] via SUBCUTANEOUS
  Administered 2023-02-23: 7 [IU] via SUBCUTANEOUS
  Administered 2023-02-23: 4 [IU] via SUBCUTANEOUS

## 2023-02-22 MED ORDER — INSULIN ASPART 100 UNIT/ML IJ SOLN
0.0000 [IU] | Freq: Every day | INTRAMUSCULAR | Status: DC
  Administered 2023-02-22: 3 [IU] via SUBCUTANEOUS

## 2023-02-22 NOTE — Progress Notes (Addendum)
Persistent hyperglycemia: Patient nurse reporting that patient fasting blood glucose is 331. Long and short acting insulin has been adjusted yesterday 10/12.  Currently on Lantus 25 units twice daily, scheduled NovoLog 3 units 3 times daily with meals and moderate sliding scale insulin as needed for mealtime coverage. Plan: - Increasing scheduled NovoLog 3 units to 5 units 3 times daily. -As fasting blood glucose is elevated increasing Lantus 25 units to 30 units twice daily and adding at bedtime insulin coverage as needed as well.  Tereasa Coop, MD [facility] 02/22/2023, 4:35 AM

## 2023-02-22 NOTE — Progress Notes (Signed)
[facility] Progress Note  Subjective: seen in room, up in chair, in good spirits. BP's are stable. 600 cc UOP yesterday.   Vitals:   02/22/23 0329 02/22/23 0400 02/22/23 0430 02/22/23 0800  BP: (!) 129/58 (!) 129/59  (!) 115/58  Pulse: 82 80  82  Resp: 13 14  12   Temp:  (!) 97.5 F (36.4 C)  (!) 97.4 F (36.3 C)  TempSrc:  Oral  Oral  SpO2: 94% 96%  95%  Weight:   82.1 kg   Height:        Exam: Gen alert, no distress, elderly WF No jvd or bruits Chest clear bilat to bases RRR no RG Abd soft ntnd no mass or ascites +bs MS R hand is wrapped post-op Ext no LE  edema Neuro is alert, Ox 3 , nf, no asterixis      Renal-related home meds: - gabapentin 400 bid - norvasc 5 - bisoprolol 10 every day - bumex 1mg  every day - losartan 25 every day - others: sitagliptin, tramadol, zocor, insulin glargine, glipizide, zyloprim          Date                       Creat               eGFR     Feb 2018                 2.02 September 2016                3.75 >> 1.62     Nov 2022                 1.53- 2.20 Dec 2022                 2.55                 18 ml/min, IV    02/18/23                   3.73                 12 ml/min     10/10                       3.84      10/11                      3.82      02/21/23                 4.40                 10 ml/min           Renal US -  9/ 9.2 cm kidneys w/o hydro, ^'d echo on R, nl echo on L     UA 10/12 - prot > 300, 0-5 rbc/wbc      UNa 27,  UCr 119           Assessment/ Plan: AKI on CKD 4 - b/l creat 2.5 from August 2024, eGFR 18 ml/min. Followed by Dr Malen Gauze at [facility]. She says that she does not want dialysis no matter what the circumstances. Creat here was 3.7 on admission in the setting of new cellulitis/abscess of  her R hand/ 3rd finger. She underwent surgery/ I&D of the hand on 10/11.  UA show proteinuria only, renal US was unremarkable and urine lytes were c/w pre-renal. Was taking a loop diuretic and  ARB at home. On exam looked dry to euvolemic. Suspected AKI due to pre-renal / volume depletion + advanced CKD.  Gave NS bolus of 750 cc NS and continued IVF's at 100 cc/hr. Today creat is down 4.4 --> 4.0. Will cont current IVF"s  (LR at 75 cc/hr) and give 500 cc NS bolus today.  F/u labs in am.  Cellulitis / abscess - of the R hand, sp I&D/ partial amputation on 10/11 DM2 - per pmd      Vinson Moselle MD  [facility] 02/22/2023, 11:52 AM  Recent Labs  Lab 02/19/23 1651 02/19/23 2139 02/20/23 0300 02/21/23 0245 02/22/23 0739  HGB 9.0*  --  7.7* 8.1* 8.4*  ALBUMIN 3.1*  --  1.9*  --   --   CALCIUM 9.0  --  8.4* 8.3* 8.4*  PHOS  --  4.3 4.3  --   --   CREATININE 3.84*  --  3.82* 4.40* 4.07*  K 4.1  --  4.1 4.5 4.4   No results for input(s): "IRON", "TIBC", "FERRITIN" in the last 168 hours. Inpatient medications:  allopurinol  100 mg Oral BID   bisoprolol  10 mg Oral Daily   gabapentin  100 mg Oral BID   insulin aspart  0-20 Units Subcutaneous TID WC   insulin aspart  0-5 Units Subcutaneous QHS   insulin aspart  3 Units Subcutaneous TID WC   insulin glargine-yfgn  50 Units Subcutaneous Daily   linezolid  600 mg Oral BID   multivitamin with minerals  1 tablet Oral Daily   nutrition supplement (JUVEN)  1 packet Oral BID BM   simvastatin  5 mg Oral QHS    ceFEPime (MAXIPIME) IV 2 g (02/21/23 2340)   lactated ringers 75 mL/hr at 02/22/23 0900   acetaminophen **OR** acetaminophen, benzonatate, guaiFENesin-dextromethorphan, HYDROcodone-acetaminophen, naLOXone (NARCAN)  injection, ondansetron **OR** ondansetron (ZOFRAN) IV

## 2023-02-22 NOTE — Evaluation (Signed)
Occupational Therapy Evaluation Patient Details Name: Angela Castro MRN: [id number] DOB: March 11, 1941 Today's Date: 02/22/2023   History of Present Illness 82 y.o. female Presented 02/19/23 with  right hand 3rd finger swelling and redness; 10/11 Rt long finger partial amputation with I&D of flexor tendon sheath and open carpal tunnel relaease  PMH significant of DM2, diastolic CHF,  CKD 3, IDA, gout, neuropathy   Clinical Impression   Pt admitted for above, reinforced R hand NWB precautions but educated pt on the continued AROM of uninvolved joints down to R wrist and elevating the UE to reduce swelling. Pt needs assist with ADLs at baseline and continues to need total to Min A with ADLs at this time, her spouse can somewhat assist but daughter in law is working on procuring PCA for ADL assist at home. Initially pt had trouble with feeding but with education on Ucuff and plate guard pt was sufficient in self feeding. OT to continue to follow pt acutely to address deficits and help promote her functional independence. Patient would benefit from post acute Home OT services to help maximize functional independence in natural environment with PCA in place.        If plan is discharge home, recommend the following: A lot of help with bathing/dressing/bathroom;Assistance with cooking/housework;Assist for transportation;Help with stairs or ramp for entrance    Functional Status Assessment  Patient has had a recent decline in their functional status and demonstrates the ability to make significant improvements in function in a reasonable and predictable amount of time.  Equipment Recommendations  None recommended by OT (pt has rec DME)    Recommendations for Other Services       Precautions / Restrictions Precautions Precautions: Fall Restrictions Weight Bearing Restrictions: Yes RUE Weight Bearing: Non weight bearing Other Position/Activity Restrictions: Can WB through R elbow       Mobility Bed Mobility               General bed mobility comments: Pt sitting EOB on arrival    Transfers Overall transfer level: Needs assistance Equipment used: Rollator (4 wheels) Transfers: Bed to chair/wheelchair/BSC, Sit to/from Stand Sit to Stand: Contact guard assist, From elevated surface Stand pivot transfers: Contact guard assist         General transfer comment: Pt needing cues for brake management      Balance Overall balance assessment: Needs assistance Sitting-balance support: Feet supported, No upper extremity supported Sitting balance-Leahy Scale: Fair     Standing balance support: Reliant on assistive device for balance, During functional activity Standing balance-Leahy Scale: Poor                             ADL either performed or assessed with clinical judgement   ADL Overall ADL's : Needs assistance/impaired Eating/Feeding: Set up;Sitting Eating/Feeding Details (indicate cue type and reason): Educated pt on the use of universal cuff and plate gaurd to complete self feeding. Pt initially needed Total A for feeding due to inability to hold utensils Grooming: Sitting;Minimal assistance   Upper Body Bathing: Sitting;Minimal assistance   Lower Body Bathing: Maximal assistance;Sitting/lateral leans   Upper Body Dressing : Sitting;Moderate assistance   Lower Body Dressing: Sitting/lateral leans;Sit to/from stand;Total assistance   Toilet Transfer: Contact guard assist;Stand-pivot Toilet Transfer Details (indicate cue type and reason): sim with recliner transfer Toileting- Clothing Manipulation and Hygiene: Maximal assistance               Vision  Perception         Praxis         Pertinent Vitals/Pain Pain Assessment Pain Assessment: Faces Faces Pain Scale: Hurts little more Pain Location: R hand Pain Descriptors / Indicators: Aching, Sore Pain Intervention(s): Limited activity within patient's  tolerance, Monitored during session, Repositioned     Extremity/Trunk Assessment Upper Extremity Assessment Upper Extremity Assessment: Generalized weakness;Right hand dominant;RUE deficits/detail;LUE deficits/detail RUE Deficits / Details: Ace wrapped, no WB allowed LUE Deficits / Details: able to make full fist, missing tips LUE Coordination: decreased fine motor   Lower Extremity Assessment Lower Extremity Assessment: Defer to PT evaluation       Communication Communication Communication: No apparent difficulties   Cognition Arousal: Alert Behavior During Therapy: WFL for tasks assessed/performed Overall Cognitive Status: Impaired/Different from baseline Area of Impairment: Safety/judgement                         Safety/Judgement: Decreased awareness of safety     General Comments: very pleasant, does not use brakes on rollator like she should     General Comments  VSS on RA, daughter in law present    Exercises     Shoulder Instructions      Home Living Family/patient expects to be discharged to:: Private residence Living Arrangements: Spouse/significant other Available Help at Discharge: Family;Available 24 hours/day Type of Home: House Home Access: Ramped entrance;Stairs to enter Entrance Stairs-Number of Steps: 1 step through garage   Home Layout: One level     Bathroom Shower/Tub: Sponge bathes at baseline   Bathroom Toilet: Handicapped height     Home Equipment: Pharmacist, hospital (2 wheels);Rollator (4 wheels);BSC/3in1   Additional Comments: Pt spouse still recovering but can somewhat assist, daughter in law reports they are trying to procure a PCA to help with ADLs      Prior Functioning/Environment               Mobility Comments: Mod I using rollator ADLs Comments: Pt reports needing assist with dressing and getting setup with bathing, otherwise can complete bathing with setup        OT Problem List: Decreased  strength;Impaired balance (sitting and/or standing);Impaired UE functional use      OT Treatment/Interventions: Self-care/ADL training;Balance training;Therapeutic exercise;Therapeutic activities;Patient/family education;DME and/or AE instruction    OT Goals(Current goals can be found in the care plan section) Acute Rehab OT Goals Patient Stated Goal: To go home OT Goal Formulation: With patient/family Time For Goal Achievement: 03/08/23 Potential to Achieve Goals: Good  OT Frequency: Min 1X/week    Co-evaluation              AM-PAC OT "6 Clicks" Daily Activity     Outcome Measure Help from another person eating meals?: A Little Help from another person taking care of personal grooming?: A Little Help from another person toileting, which includes using toliet, bedpan, or urinal?: A Lot Help from another person bathing (including washing, rinsing, drying)?: A Lot Help from another person to put on and taking off regular upper body clothing?: A Lot Help from another person to put on and taking off regular lower body clothing?: Total 6 Click Score: 13   End of Session Equipment Utilized During Treatment: Gait belt;Rollator (4 wheels) Nurse Communication: Mobility status  Activity Tolerance: Patient tolerated treatment well Patient left: in chair;with call bell/phone within reach;with family/visitor present  OT Visit Diagnosis: Unsteadiness on feet (R26.81);Other abnormalities of gait and mobility (R26.89);Muscle  weakness (generalized) (M62.81)                Time: 0109-3235 OT Time Calculation (min): 40 min Charges:  OT General Charges $OT Visit: 1 Visit OT Evaluation $OT Eval Moderate Complexity: 1 Mod OT Treatments $Self Care/Home Management : 8-22 mins $Therapeutic Activity: 8-22 mins  02/22/2023  AB, OTR/L  Acute Rehabilitation Services  Office: [phone]   Tristan Schroeder 02/22/2023, 9:38 AM

## 2023-02-22 NOTE — Progress Notes (Signed)
PROGRESS NOTE                                                                                                                                                                                                             Patient Demographics:    Angela Castro, is a 82 y.o. female, DOB - Jan 14, 1941, UJW:[id number]  Outpatient Primary MD for the patient is Farris Has, MD    LOS - 3  Admit date - 02/19/2023    Chief Complaint  Patient presents with   hand swelling       Brief Narrative (HPI from H&P)   82 y.o. female with medical history significant of DM2, diastolic CHF,  CKD 3, IDA, gout, neuropathy. Presented with  right hand 3rd finger swelling and redness.   Presents with a right hand third finger swelling redness was at wound care yesterday and was told to go to emergency department has history of wounds that required an amputation with underlying history of diabetes there is some drainage of a right hand finger, purulent drainage for the past 3 days. She was originally seen at [facility], ER but left without being seen by provider.  She was diagnosed with severe right hand cellulitis and possible abscess and admitted to the hospital.   Subjective:   Patient in bed, appears comfortable, denies any headache, no fever, no chest pain or pressure, no shortness of breath , no abdominal pain. No focal weakness.   Assessment  & Plan :    Cellulitis, abscess of the right third digit with possible underlying osteomyelitis.  Her infection has been ongoing for the last several days, he has evidence of sepsis upon admission, has been placed on appropriate IV antibiotics, type screen done, MRSA nasal PCR so far is negative.  Hand surgery consulted, MRI noted, patient underwent Right long finger partial amputation, I&D of flexor tendon sheath and deep spaces of palm & Open carpal tunnel release on 02/20/2023 by Ramon Dredge  MD, continue antibiotics follow cultures.  PT OT.  Chronic diastolic CHF EF 60% on echocardiogram done 2 years ago.  Currently compensated, n.p.o. due to above may require OR, gentle IV fluids,   AKI on CKD 4.  Baseline creatinine around 2.6.  Hold nephrotoxins, switch vancomycin to Zyvox and monitor.  She has seen nephrologist in the outpatient setting  Panel     Status: None (Preliminary result)   Collection Time: 02/20/23  3:11 AM   Specimen: BLOOD  Result Value Ref Range Status   Specimen Description BLOOD LEFT ANTECUBITAL  Final   Special Requests   Final    BOTTLES DRAWN AEROBIC AND ANAEROBIC Blood Culture adequate volume   Culture   Final    NO GROWTH 2 DAYS Performed at [facility], [address], [place], [place] [address]    Report Status PENDING  Incomplete  Aerobic/Anaerobic Culture w Gram Stain (surgical/deep wound)     Status: None (Preliminary result)   Collection Time: 02/20/23 10:00 PM   Specimen: Wound; Body Fluid  Result Value Ref Range Status   Specimen Description WOUND  Final   Special Requests BONE FINGER  Final   Gram Stain NO WBC SEEN NO ORGANISMS SEEN   Final   Culture   Final    CULTURE REINCUBATED FOR BETTER GROWTH Performed at [facility], [address] [place], [place] [address]    Report Status PENDING  Incomplete  Aerobic/Anaerobic Culture w Gram Stain (surgical/deep  wound)     Status: None (Preliminary result)   Collection Time: 02/20/23 10:00 PM   Specimen: Wound; Body Fluid  Result Value Ref Range Status   Specimen Description WOUND  Final   Special Requests FINGER  Final   Gram Stain   Final    FEW WBC PRESENT,BOTH PMN AND MONONUCLEAR FEW GRAM NEGATIVE RODS FEW GRAM POSITIVE COCCI IN PAIRS    Culture   Final    CULTURE REINCUBATED FOR BETTER GROWTH Performed at [facility], [address] [place], [place] [address]    Report Status PENDING  Incomplete  Aerobic/Anaerobic Culture w Gram Stain (surgical/deep wound)     Status: None (Preliminary result)   Collection Time: 02/20/23 10:01 PM   Specimen: PATH Bone biopsy; Tissue  Result Value Ref Range Status   Specimen Description WOUND  Final   Special Requests FINGER  Final   Gram Stain   Final    MODERATE WBC PRESENT,BOTH PMN AND MONONUCLEAR FEW GRAM NEGATIVE RODS FEW GRAM POSITIVE COCCI IN PAIRS    Culture   Final    CULTURE REINCUBATED FOR BETTER GROWTH Performed at [facility], [address] [place], [place] [address]    Report Status PENDING  Incomplete    Radiology Reports US RENAL  Result Date: 02/21/2023 CLINICAL DATA:  Acute kidney injury EXAM: RENAL / URINARY TRACT ULTRASOUND COMPLETE COMPARISON:  02/03/2023 FINDINGS: Right Kidney: Renal measurements: 9 x 3.8 x 4.2 cm = volume: 76.09 mL. Increased parenchymal echogenicity. No mass or hydronephrosis visualized. Left Kidney: Suboptimally visualized. None renal measurements: 9.2 x 5.2 x 4.7 cm = volume: 118.7 mL. Echogenicity within normal limits. No mass or hydronephrosis visualized. Bladder: Appears normal for degree of bladder distention. Other: Fluid noted within Morrison's pouch. IMPRESSION: 1. No acute findings. No hydronephrosis. 2. Increased parenchymal echogenicity of the right kidney compatible with medical renal disease. Electronically Signed   By: Signa Kell M.D.   On: 02/21/2023 10:28   VAS [place] UPPER  EXTREMITY ARTERIAL DUPLEX  Result Date: 02/20/2023  UPPER EXTREMITY DUPLEX STUDY Patient Name:  Angela Castro  Date of Exam:   02/20/2023 Medical Rec #: [id number]       Accession #:    [id number] Date of Birth: 25-Apr-1941       Patient Gender: F Patient Age:   89 years  Panel     Status: None (Preliminary result)   Collection Time: 02/20/23  3:11 AM   Specimen: BLOOD  Result Value Ref Range Status   Specimen Description BLOOD LEFT ANTECUBITAL  Final   Special Requests   Final    BOTTLES DRAWN AEROBIC AND ANAEROBIC Blood Culture adequate volume   Culture   Final    NO GROWTH 2 DAYS Performed at [facility], [address], [place], [place] [address]    Report Status PENDING  Incomplete  Aerobic/Anaerobic Culture w Gram Stain (surgical/deep wound)     Status: None (Preliminary result)   Collection Time: 02/20/23 10:00 PM   Specimen: Wound; Body Fluid  Result Value Ref Range Status   Specimen Description WOUND  Final   Special Requests BONE FINGER  Final   Gram Stain NO WBC SEEN NO ORGANISMS SEEN   Final   Culture   Final    CULTURE REINCUBATED FOR BETTER GROWTH Performed at [facility], [address] [place], [place] [address]    Report Status PENDING  Incomplete  Aerobic/Anaerobic Culture w Gram Stain (surgical/deep  wound)     Status: None (Preliminary result)   Collection Time: 02/20/23 10:00 PM   Specimen: Wound; Body Fluid  Result Value Ref Range Status   Specimen Description WOUND  Final   Special Requests FINGER  Final   Gram Stain   Final    FEW WBC PRESENT,BOTH PMN AND MONONUCLEAR FEW GRAM NEGATIVE RODS FEW GRAM POSITIVE COCCI IN PAIRS    Culture   Final    CULTURE REINCUBATED FOR BETTER GROWTH Performed at [facility], [address] [place], [place] [address]    Report Status PENDING  Incomplete  Aerobic/Anaerobic Culture w Gram Stain (surgical/deep wound)     Status: None (Preliminary result)   Collection Time: 02/20/23 10:01 PM   Specimen: PATH Bone biopsy; Tissue  Result Value Ref Range Status   Specimen Description WOUND  Final   Special Requests FINGER  Final   Gram Stain   Final    MODERATE WBC PRESENT,BOTH PMN AND MONONUCLEAR FEW GRAM NEGATIVE RODS FEW GRAM POSITIVE COCCI IN PAIRS    Culture   Final    CULTURE REINCUBATED FOR BETTER GROWTH Performed at [facility], [address] [place], [place] [address]    Report Status PENDING  Incomplete    Radiology Reports US RENAL  Result Date: 02/21/2023 CLINICAL DATA:  Acute kidney injury EXAM: RENAL / URINARY TRACT ULTRASOUND COMPLETE COMPARISON:  02/03/2023 FINDINGS: Right Kidney: Renal measurements: 9 x 3.8 x 4.2 cm = volume: 76.09 mL. Increased parenchymal echogenicity. No mass or hydronephrosis visualized. Left Kidney: Suboptimally visualized. None renal measurements: 9.2 x 5.2 x 4.7 cm = volume: 118.7 mL. Echogenicity within normal limits. No mass or hydronephrosis visualized. Bladder: Appears normal for degree of bladder distention. Other: Fluid noted within Morrison's pouch. IMPRESSION: 1. No acute findings. No hydronephrosis. 2. Increased parenchymal echogenicity of the right kidney compatible with medical renal disease. Electronically Signed   By: Signa Kell M.D.   On: 02/21/2023 10:28   VAS [place] UPPER  EXTREMITY ARTERIAL DUPLEX  Result Date: 02/20/2023  UPPER EXTREMITY DUPLEX STUDY Patient Name:  Angela Castro  Date of Exam:   02/20/2023 Medical Rec #: [id number]       Accession #:    [id number] Date of Birth: 25-Apr-1941       Patient Gender: F Patient Age:   89 years  Panel     Status: None (Preliminary result)   Collection Time: 02/20/23  3:11 AM   Specimen: BLOOD  Result Value Ref Range Status   Specimen Description BLOOD LEFT ANTECUBITAL  Final   Special Requests   Final    BOTTLES DRAWN AEROBIC AND ANAEROBIC Blood Culture adequate volume   Culture   Final    NO GROWTH 2 DAYS Performed at [facility], [address], [place], [place] [address]    Report Status PENDING  Incomplete  Aerobic/Anaerobic Culture w Gram Stain (surgical/deep wound)     Status: None (Preliminary result)   Collection Time: 02/20/23 10:00 PM   Specimen: Wound; Body Fluid  Result Value Ref Range Status   Specimen Description WOUND  Final   Special Requests BONE FINGER  Final   Gram Stain NO WBC SEEN NO ORGANISMS SEEN   Final   Culture   Final    CULTURE REINCUBATED FOR BETTER GROWTH Performed at [facility], [address] [place], [place] [address]    Report Status PENDING  Incomplete  Aerobic/Anaerobic Culture w Gram Stain (surgical/deep  wound)     Status: None (Preliminary result)   Collection Time: 02/20/23 10:00 PM   Specimen: Wound; Body Fluid  Result Value Ref Range Status   Specimen Description WOUND  Final   Special Requests FINGER  Final   Gram Stain   Final    FEW WBC PRESENT,BOTH PMN AND MONONUCLEAR FEW GRAM NEGATIVE RODS FEW GRAM POSITIVE COCCI IN PAIRS    Culture   Final    CULTURE REINCUBATED FOR BETTER GROWTH Performed at [facility], [address] [place], [place] [address]    Report Status PENDING  Incomplete  Aerobic/Anaerobic Culture w Gram Stain (surgical/deep wound)     Status: None (Preliminary result)   Collection Time: 02/20/23 10:01 PM   Specimen: PATH Bone biopsy; Tissue  Result Value Ref Range Status   Specimen Description WOUND  Final   Special Requests FINGER  Final   Gram Stain   Final    MODERATE WBC PRESENT,BOTH PMN AND MONONUCLEAR FEW GRAM NEGATIVE RODS FEW GRAM POSITIVE COCCI IN PAIRS    Culture   Final    CULTURE REINCUBATED FOR BETTER GROWTH Performed at [facility], [address] [place], [place] [address]    Report Status PENDING  Incomplete    Radiology Reports US RENAL  Result Date: 02/21/2023 CLINICAL DATA:  Acute kidney injury EXAM: RENAL / URINARY TRACT ULTRASOUND COMPLETE COMPARISON:  02/03/2023 FINDINGS: Right Kidney: Renal measurements: 9 x 3.8 x 4.2 cm = volume: 76.09 mL. Increased parenchymal echogenicity. No mass or hydronephrosis visualized. Left Kidney: Suboptimally visualized. None renal measurements: 9.2 x 5.2 x 4.7 cm = volume: 118.7 mL. Echogenicity within normal limits. No mass or hydronephrosis visualized. Bladder: Appears normal for degree of bladder distention. Other: Fluid noted within Morrison's pouch. IMPRESSION: 1. No acute findings. No hydronephrosis. 2. Increased parenchymal echogenicity of the right kidney compatible with medical renal disease. Electronically Signed   By: Signa Kell M.D.   On: 02/21/2023 10:28   VAS [place] UPPER  EXTREMITY ARTERIAL DUPLEX  Result Date: 02/20/2023  UPPER EXTREMITY DUPLEX STUDY Patient Name:  Angela Castro  Date of Exam:   02/20/2023 Medical Rec #: [id number]       Accession #:    [id number] Date of Birth: 25-Apr-1941       Patient Gender: F Patient Age:   89 years  Panel     Status: None (Preliminary result)   Collection Time: 02/20/23  3:11 AM   Specimen: BLOOD  Result Value Ref Range Status   Specimen Description BLOOD LEFT ANTECUBITAL  Final   Special Requests   Final    BOTTLES DRAWN AEROBIC AND ANAEROBIC Blood Culture adequate volume   Culture   Final    NO GROWTH 2 DAYS Performed at [facility], [address], [place], [place] [address]    Report Status PENDING  Incomplete  Aerobic/Anaerobic Culture w Gram Stain (surgical/deep wound)     Status: None (Preliminary result)   Collection Time: 02/20/23 10:00 PM   Specimen: Wound; Body Fluid  Result Value Ref Range Status   Specimen Description WOUND  Final   Special Requests BONE FINGER  Final   Gram Stain NO WBC SEEN NO ORGANISMS SEEN   Final   Culture   Final    CULTURE REINCUBATED FOR BETTER GROWTH Performed at [facility], [address] [place], [place] [address]    Report Status PENDING  Incomplete  Aerobic/Anaerobic Culture w Gram Stain (surgical/deep  wound)     Status: None (Preliminary result)   Collection Time: 02/20/23 10:00 PM   Specimen: Wound; Body Fluid  Result Value Ref Range Status   Specimen Description WOUND  Final   Special Requests FINGER  Final   Gram Stain   Final    FEW WBC PRESENT,BOTH PMN AND MONONUCLEAR FEW GRAM NEGATIVE RODS FEW GRAM POSITIVE COCCI IN PAIRS    Culture   Final    CULTURE REINCUBATED FOR BETTER GROWTH Performed at [facility], [address] [place], [place] [address]    Report Status PENDING  Incomplete  Aerobic/Anaerobic Culture w Gram Stain (surgical/deep wound)     Status: None (Preliminary result)   Collection Time: 02/20/23 10:01 PM   Specimen: PATH Bone biopsy; Tissue  Result Value Ref Range Status   Specimen Description WOUND  Final   Special Requests FINGER  Final   Gram Stain   Final    MODERATE WBC PRESENT,BOTH PMN AND MONONUCLEAR FEW GRAM NEGATIVE RODS FEW GRAM POSITIVE COCCI IN PAIRS    Culture   Final    CULTURE REINCUBATED FOR BETTER GROWTH Performed at [facility], [address] [place], [place] [address]    Report Status PENDING  Incomplete    Radiology Reports US RENAL  Result Date: 02/21/2023 CLINICAL DATA:  Acute kidney injury EXAM: RENAL / URINARY TRACT ULTRASOUND COMPLETE COMPARISON:  02/03/2023 FINDINGS: Right Kidney: Renal measurements: 9 x 3.8 x 4.2 cm = volume: 76.09 mL. Increased parenchymal echogenicity. No mass or hydronephrosis visualized. Left Kidney: Suboptimally visualized. None renal measurements: 9.2 x 5.2 x 4.7 cm = volume: 118.7 mL. Echogenicity within normal limits. No mass or hydronephrosis visualized. Bladder: Appears normal for degree of bladder distention. Other: Fluid noted within Morrison's pouch. IMPRESSION: 1. No acute findings. No hydronephrosis. 2. Increased parenchymal echogenicity of the right kidney compatible with medical renal disease. Electronically Signed   By: Signa Kell M.D.   On: 02/21/2023 10:28   VAS [place] UPPER  EXTREMITY ARTERIAL DUPLEX  Result Date: 02/20/2023  UPPER EXTREMITY DUPLEX STUDY Patient Name:  Angela Castro  Date of Exam:   02/20/2023 Medical Rec #: [id number]       Accession #:    [id number] Date of Birth: 25-Apr-1941       Patient Gender: F Patient Age:   89 years  Panel     Status: None (Preliminary result)   Collection Time: 02/20/23  3:11 AM   Specimen: BLOOD  Result Value Ref Range Status   Specimen Description BLOOD LEFT ANTECUBITAL  Final   Special Requests   Final    BOTTLES DRAWN AEROBIC AND ANAEROBIC Blood Culture adequate volume   Culture   Final    NO GROWTH 2 DAYS Performed at [facility], [address], [place], [place] [address]    Report Status PENDING  Incomplete  Aerobic/Anaerobic Culture w Gram Stain (surgical/deep wound)     Status: None (Preliminary result)   Collection Time: 02/20/23 10:00 PM   Specimen: Wound; Body Fluid  Result Value Ref Range Status   Specimen Description WOUND  Final   Special Requests BONE FINGER  Final   Gram Stain NO WBC SEEN NO ORGANISMS SEEN   Final   Culture   Final    CULTURE REINCUBATED FOR BETTER GROWTH Performed at [facility], [address] [place], [place] [address]    Report Status PENDING  Incomplete  Aerobic/Anaerobic Culture w Gram Stain (surgical/deep  wound)     Status: None (Preliminary result)   Collection Time: 02/20/23 10:00 PM   Specimen: Wound; Body Fluid  Result Value Ref Range Status   Specimen Description WOUND  Final   Special Requests FINGER  Final   Gram Stain   Final    FEW WBC PRESENT,BOTH PMN AND MONONUCLEAR FEW GRAM NEGATIVE RODS FEW GRAM POSITIVE COCCI IN PAIRS    Culture   Final    CULTURE REINCUBATED FOR BETTER GROWTH Performed at [facility], [address] [place], [place] [address]    Report Status PENDING  Incomplete  Aerobic/Anaerobic Culture w Gram Stain (surgical/deep wound)     Status: None (Preliminary result)   Collection Time: 02/20/23 10:01 PM   Specimen: PATH Bone biopsy; Tissue  Result Value Ref Range Status   Specimen Description WOUND  Final   Special Requests FINGER  Final   Gram Stain   Final    MODERATE WBC PRESENT,BOTH PMN AND MONONUCLEAR FEW GRAM NEGATIVE RODS FEW GRAM POSITIVE COCCI IN PAIRS    Culture   Final    CULTURE REINCUBATED FOR BETTER GROWTH Performed at [facility], [address] [place], [place] [address]    Report Status PENDING  Incomplete    Radiology Reports US RENAL  Result Date: 02/21/2023 CLINICAL DATA:  Acute kidney injury EXAM: RENAL / URINARY TRACT ULTRASOUND COMPLETE COMPARISON:  02/03/2023 FINDINGS: Right Kidney: Renal measurements: 9 x 3.8 x 4.2 cm = volume: 76.09 mL. Increased parenchymal echogenicity. No mass or hydronephrosis visualized. Left Kidney: Suboptimally visualized. None renal measurements: 9.2 x 5.2 x 4.7 cm = volume: 118.7 mL. Echogenicity within normal limits. No mass or hydronephrosis visualized. Bladder: Appears normal for degree of bladder distention. Other: Fluid noted within Morrison's pouch. IMPRESSION: 1. No acute findings. No hydronephrosis. 2. Increased parenchymal echogenicity of the right kidney compatible with medical renal disease. Electronically Signed   By: Signa Kell M.D.   On: 02/21/2023 10:28   VAS [place] UPPER  EXTREMITY ARTERIAL DUPLEX  Result Date: 02/20/2023  UPPER EXTREMITY DUPLEX STUDY Patient Name:  Angela Castro  Date of Exam:   02/20/2023 Medical Rec #: [id number]       Accession #:    [id number] Date of Birth: 25-Apr-1941       Patient Gender: F Patient Age:   89 years  Panel     Status: None (Preliminary result)   Collection Time: 02/20/23  3:11 AM   Specimen: BLOOD  Result Value Ref Range Status   Specimen Description BLOOD LEFT ANTECUBITAL  Final   Special Requests   Final    BOTTLES DRAWN AEROBIC AND ANAEROBIC Blood Culture adequate volume   Culture   Final    NO GROWTH 2 DAYS Performed at [facility], [address], [place], [place] [address]    Report Status PENDING  Incomplete  Aerobic/Anaerobic Culture w Gram Stain (surgical/deep wound)     Status: None (Preliminary result)   Collection Time: 02/20/23 10:00 PM   Specimen: Wound; Body Fluid  Result Value Ref Range Status   Specimen Description WOUND  Final   Special Requests BONE FINGER  Final   Gram Stain NO WBC SEEN NO ORGANISMS SEEN   Final   Culture   Final    CULTURE REINCUBATED FOR BETTER GROWTH Performed at [facility], [address] [place], [place] [address]    Report Status PENDING  Incomplete  Aerobic/Anaerobic Culture w Gram Stain (surgical/deep  wound)     Status: None (Preliminary result)   Collection Time: 02/20/23 10:00 PM   Specimen: Wound; Body Fluid  Result Value Ref Range Status   Specimen Description WOUND  Final   Special Requests FINGER  Final   Gram Stain   Final    FEW WBC PRESENT,BOTH PMN AND MONONUCLEAR FEW GRAM NEGATIVE RODS FEW GRAM POSITIVE COCCI IN PAIRS    Culture   Final    CULTURE REINCUBATED FOR BETTER GROWTH Performed at [facility], [address] [place], [place] [address]    Report Status PENDING  Incomplete  Aerobic/Anaerobic Culture w Gram Stain (surgical/deep wound)     Status: None (Preliminary result)   Collection Time: 02/20/23 10:01 PM   Specimen: PATH Bone biopsy; Tissue  Result Value Ref Range Status   Specimen Description WOUND  Final   Special Requests FINGER  Final   Gram Stain   Final    MODERATE WBC PRESENT,BOTH PMN AND MONONUCLEAR FEW GRAM NEGATIVE RODS FEW GRAM POSITIVE COCCI IN PAIRS    Culture   Final    CULTURE REINCUBATED FOR BETTER GROWTH Performed at [facility], [address] [place], [place] [address]    Report Status PENDING  Incomplete    Radiology Reports US RENAL  Result Date: 02/21/2023 CLINICAL DATA:  Acute kidney injury EXAM: RENAL / URINARY TRACT ULTRASOUND COMPLETE COMPARISON:  02/03/2023 FINDINGS: Right Kidney: Renal measurements: 9 x 3.8 x 4.2 cm = volume: 76.09 mL. Increased parenchymal echogenicity. No mass or hydronephrosis visualized. Left Kidney: Suboptimally visualized. None renal measurements: 9.2 x 5.2 x 4.7 cm = volume: 118.7 mL. Echogenicity within normal limits. No mass or hydronephrosis visualized. Bladder: Appears normal for degree of bladder distention. Other: Fluid noted within Morrison's pouch. IMPRESSION: 1. No acute findings. No hydronephrosis. 2. Increased parenchymal echogenicity of the right kidney compatible with medical renal disease. Electronically Signed   By: Signa Kell M.D.   On: 02/21/2023 10:28   VAS [place] UPPER  EXTREMITY ARTERIAL DUPLEX  Result Date: 02/20/2023  UPPER EXTREMITY DUPLEX STUDY Patient Name:  Angela Castro  Date of Exam:   02/20/2023 Medical Rec #: [id number]       Accession #:    [id number] Date of Birth: 25-Apr-1941       Patient Gender: F Patient Age:   89 years

## 2023-02-22 NOTE — Anesthesia Postprocedure Evaluation (Signed)
Anesthesia Post Note  Patient: Island Dohmen  Procedure(s) Performed: IRRIGATION AND DEBRIDEMENT vs. AMPUTATION RIGHT LONG FINGER (Right: Middle Finger)     Patient location during evaluation: PACU Anesthesia Type: MAC and Regional Level of consciousness: awake and alert Pain management: pain level controlled Vital Signs Assessment: post-procedure vital signs reviewed and stable Respiratory status: spontaneous breathing, nonlabored ventilation, respiratory function stable and patient connected to nasal cannula oxygen Cardiovascular status: stable and blood pressure returned to baseline Postop Assessment: no apparent nausea or vomiting Anesthetic complications: no   No notable events documented.  Last Vitals:  Vitals:   02/22/23 0800 02/22/23 1244  BP: (!) 115/58 (!) 144/72  Pulse: 82 77  Resp: 12 14  Temp: (!) 36.3 C (!) 36.3 C  SpO2: 95% 98%    Last Pain:  Vitals:   02/22/23 1124  TempSrc:   PainSc: 3                  Aamna Mallozzi P Rockie Vawter

## 2023-02-22 NOTE — Evaluation (Signed)
Physical Therapy Brief Evaluation and Discharge Note Patient Details Name: Angela Castro MRN: [id number] DOB: 1940-11-02 Today's Date: 02/22/2023   History of Present Illness  82 y.o. female Presented 02/19/23 with  right hand 3rd finger swelling and redness; 10/11 Rt long finger partial amputation with I&D of flexor tendon sheath and open carpal tunnel relaease  PMH significant of DM2, diastolic CHF,  CKD 3, IDA, gout, neuropathy  Clinical Impression   Patient evaluated by Physical Therapy with no further acute PT needs identified. All education has been completed and the patient has no further questions. She is modified independent with her rollator--she has always propped on her forearms and able to maintain NWB Rt wrist and hand. PT is signing off with Mobility Team to follow. Thank you for this referral.        PT Assessment Patient does not need any further PT services  Assistance Needed at Discharge  PRN    Equipment Recommendations None recommended by PT  Recommendations for Other Services       Precautions/Restrictions Precautions Precautions: Fall Restrictions Weight Bearing Restrictions: Yes RUE Weight Bearing: Weight bear through elbow only Other Position/Activity Restrictions: Can WB through R elbow        Mobility  Bed Mobility       General bed mobility comments: up in recliner  Transfers Overall transfer level: Needs assistance Equipment used: Rollator (4 wheels) Transfers: Sit to/from Stand Sit to Stand: Min assist           General transfer comment: pt usually uses lift chair to help her stand up; min assist from chair height    Ambulation/Gait Ambulation/Gait assistance: Modified independent (Device/Increase time) Gait Distance (Feet): 60 Feet Assistive device: Rollator (4 wheels) Gait Pattern/deviations: Step-through pattern, Trunk flexed Gait Speed: Below normal General Gait Details: pt has walked with forearms on handles of rollator  ever since her rt femur surgery  Home Activity Instructions    Stairs            Modified Rankin (Stroke Patients Only)        Balance Overall balance assessment: Needs assistance   Sitting balance-Leahy Scale: Fair     Standing balance support: Reliant on assistive device for balance, During functional activity Standing balance-Leahy Scale: Poor            Pertinent Vitals/Pain PT - Brief Vital Signs All Vital Signs Stable: Yes Pain Assessment Pain Assessment: Faces Faces Pain Scale: Hurts a little bit Pain Location: R hand Pain Descriptors / Indicators: Aching, Sore Pain Intervention(s): Limited activity within patient's tolerance, Monitored during session, Premedicated before session     Home Living Family/patient expects to be discharged to:: Private residence Living Arrangements: Spouse/significant other Available Help at Discharge: Family;Available 24 hours/day Home Environment: Ramped entrance   Home Equipment: Pharmacist, hospital (2 wheels);Rollator (4 wheels);BSC/3in1;Lift chair   Additional Comments: Pt spouse still recovering but can somewhat assist, daughter in law reports they are trying to procure a PCA to help with ADLs    Prior Function Level of Independence: Needs assistance Comments: modified independent with rollator; some assist with ADLs    UE/LE Assessment   UE ROM/Strength/Tone/Coordination: Impaired UE ROM/Strength/Tone/Coordination Deficits: no ROM R fingers and wrist  LE ROM/Strength/Tone/Coordination: Generalized weakness (uses lift chair at home)      Communication   Communication Communication: No apparent difficulties     Cognition Overall Cognitive Status: Appears within functional limits for tasks assessed/performed       General Comments General  comments (skin integrity, edema, etc.): VSS on RA, daughter in law present    Exercises     Assessment/Plan    PT Problem List         PT Visit  Diagnosis Other abnormalities of gait and mobility (R26.89)    No Skilled PT All education completed;Patient at baseline level of functioning;Patient will have necessary level of assist by caregiver at discharge   Co-evaluation                AMPAC 6 Clicks Help needed turning from your back to your side while in a flat bed without using bedrails?: A Little Help needed moving from lying on your back to sitting on the side of a flat bed without using bedrails?: A Little Help needed moving to and from a bed to a chair (including a wheelchair)?: A Little Help needed standing up from a chair using your arms (e.g., wheelchair or bedside chair)?: A Little Help needed to walk in hospital room?: None Help needed climbing 3-5 steps with a railing? : Total 6 Click Score: 17      End of Session Equipment Utilized During Treatment: Gait belt Activity Tolerance: Patient tolerated treatment well Patient left: in chair;with call bell/phone within reach Nurse Communication: Mobility status PT Visit Diagnosis: Other abnormalities of gait and mobility (R26.89)     Time: 1610-9604 PT Time Calculation (min) (ACUTE ONLY): 34 min  Charges:   PT Evaluation $PT Eval Moderate Complexity: 1 Mod       Angela Castro, PT Acute Rehabilitation Services  Office [phone]   Angela Castro  02/22/2023, 11:25 AM

## 2023-02-22 NOTE — Plan of Care (Addendum)
Pt is alert and oriented x 4. Up 1 assist with rolator walker. Pt received 1 dose norco for pain. Surgical dsg intact to right hand. Oxygen at bedtime sats dropping in mid 80's room air. Pt had episode during sleep she got choked pt thinks she may have had mucus. Caused increased anxiety. At this time sats mid 90's on 2L lungs clear. No signs of distress. Tele keeps flagging apnea. Inaccurate from tele leads. Replaced pad and improved. BS has been running mid to high 300's after 25 units semglee and 4 units novolog. Msg sent to MD Sundil this am due to blood sugars q4 and bs is still 341. MD ordered to increase semglee to 30 and give now. Semglee 30 given. Dr. Thedore Mins placed additional 50 units this am. Provider contacted and MD ordered to only give 20 more units to equal 50 units. Pharm updated. Pt updated. Dr. Thedore Mins notified that pt has been requiring oxygen 2L. Per shift report pt has been requiring at night due to sats drops. Tele alarming at times apnea. Pt isn't aware of dx of sleep apnea. MD notified. No orders received. Vitals stable. UA sent this shift.  Problem: Metabolic: Goal: Ability to maintain appropriate glucose levels will improve 02/22/2023 0447 by Moishe Spice, RN Outcome: Not Progressing 02/22/2023 0447 by Moishe Spice, RN Outcome: Progressing   Problem: Education: Goal: Knowledge of General Education information will improve Description: Including pain rating scale, medication(s)/side effects and non-pharmacologic comfort measures 02/22/2023 0447 by Moishe Spice, RN Outcome: Progressing 02/22/2023 0447 by Moishe Spice, RN Outcome: Progressing   Problem: Health Behavior/Discharge Planning: Goal: Ability to manage health-related needs will improve 02/22/2023 0447 by Moishe Spice, RN Outcome: Progressing 02/22/2023 0447 by Moishe Spice, RN Outcome: Progressing   Problem: Clinical Measurements: Goal: Ability to maintain clinical measurements within  normal limits will improve 02/22/2023 0447 by Moishe Spice, RN Outcome: Progressing 02/22/2023 0447 by Moishe Spice, RN Outcome: Progressing Goal: Will remain free from infection 02/22/2023 0447 by Moishe Spice, RN Outcome: Progressing 02/22/2023 0447 by Moishe Spice, RN Outcome: Progressing Goal: Diagnostic test results will improve 02/22/2023 0447 by Moishe Spice, RN Outcome: Progressing 02/22/2023 0447 by Moishe Spice, RN Outcome: Progressing Goal: Respiratory complications will improve 02/22/2023 0447 by Moishe Spice, RN Outcome: Progressing 02/22/2023 0447 by Moishe Spice, RN Outcome: Progressing Goal: Cardiovascular complication will be avoided 02/22/2023 0447 by Moishe Spice, RN Outcome: Progressing 02/22/2023 0447 by Moishe Spice, RN Outcome: Progressing   Problem: Activity: Goal: Risk for activity intolerance will decrease 02/22/2023 0447 by Moishe Spice, RN Outcome: Progressing 02/22/2023 0447 by Moishe Spice, RN Outcome: Progressing   Problem: Nutrition: Goal: Adequate nutrition will be maintained 02/22/2023 0447 by Moishe Spice, RN Outcome: Progressing 02/22/2023 0447 by Moishe Spice, RN Outcome: Progressing   Problem: Coping: Goal: Level of anxiety will decrease 02/22/2023 0447 by Moishe Spice, RN Outcome: Progressing 02/22/2023 0447 by Moishe Spice, RN Outcome: Progressing   Problem: Elimination: Goal: Will not experience complications related to bowel motility 02/22/2023 0447 by Moishe Spice, RN Outcome: Progressing 02/22/2023 0447 by Moishe Spice, RN Outcome: Progressing Goal: Will not experience complications related to urinary retention 02/22/2023 0447 by Moishe Spice, RN Outcome: Progressing 02/22/2023 0447 by Moishe Spice, RN Outcome: Progressing   Problem: Pain Managment: Goal: General experience of comfort will improve 02/22/2023 0447 by Moishe Spice,  RN Outcome: Progressing 02/22/2023  1027 by Moishe Spice, RN Outcome: Progressing   Problem: Safety: Goal: Ability to remain free from injury will improve 02/22/2023 0447 by Moishe Spice, RN Outcome: Progressing 02/22/2023 0447 by Moishe Spice, RN Outcome: Progressing   Problem: Skin Integrity: Goal: Risk for impaired skin integrity will decrease 02/22/2023 0447 by Moishe Spice, RN Outcome: Progressing 02/22/2023 0447 by Moishe Spice, RN Outcome: Progressing   Problem: Education: Goal: Ability to describe self-care measures that may prevent or decrease complications (Diabetes Survival Skills Education) will improve 02/22/2023 0447 by Moishe Spice, RN Outcome: Progressing 02/22/2023 0447 by Moishe Spice, RN Outcome: Progressing Goal: Individualized Educational Video(s) 02/22/2023 0447 by Moishe Spice, RN Outcome: Progressing 02/22/2023 0447 by Moishe Spice, RN Outcome: Progressing   Problem: Coping: Goal: Ability to adjust to condition or change in health will improve 02/22/2023 0447 by Moishe Spice, RN Outcome: Progressing 02/22/2023 0447 by Moishe Spice, RN Outcome: Progressing   Problem: Fluid Volume: Goal: Ability to maintain a balanced intake and output will improve 02/22/2023 0447 by Moishe Spice, RN Outcome: Progressing 02/22/2023 0447 by Moishe Spice, RN Outcome: Progressing   Problem: Health Behavior/Discharge Planning: Goal: Ability to identify and utilize available resources and services will improve 02/22/2023 0447 by Moishe Spice, RN Outcome: Progressing 02/22/2023 0447 by Moishe Spice, RN Outcome: Progressing Goal: Ability to manage health-related needs will improve 02/22/2023 0447 by Moishe Spice, RN Outcome: Progressing 02/22/2023 0447 by Moishe Spice, RN Outcome: Progressing   Problem: Nutritional: Goal: Maintenance of adequate nutrition will improve 02/22/2023 0447 by Moishe Spice, RN Outcome: Progressing 02/22/2023 0447 by Moishe Spice, RN Outcome: Progressing Goal: Progress toward achieving an optimal weight will improve 02/22/2023 0447 by Moishe Spice, RN Outcome: Progressing 02/22/2023 0447 by Moishe Spice, RN Outcome: Progressing   Problem: Skin Integrity: Goal: Risk for impaired skin integrity will decrease Outcome: Progressing   Problem: Tissue Perfusion: Goal: Adequacy of tissue perfusion will improve 02/22/2023 0447 by Moishe Spice, RN Outcome: Progressing 02/22/2023 0447 by Moishe Spice, RN Outcome: Progressing   Problem: Fluid Volume: Goal: Hemodynamic stability will improve 02/22/2023 0447 by Moishe Spice, RN Outcome: Progressing 02/22/2023 0447 by Moishe Spice, RN Outcome: Progressing   Problem: Clinical Measurements: Goal: Diagnostic test results will improve 02/22/2023 0447 by Moishe Spice, RN Outcome: Progressing 02/22/2023 0447 by Moishe Spice, RN Outcome: Progressing Goal: Signs and symptoms of infection will decrease 02/22/2023 0447 by Moishe Spice, RN Outcome: Progressing 02/22/2023 0447 by Moishe Spice, RN Outcome: Progressing   Problem: Respiratory: Goal: Ability to maintain adequate ventilation will improve 02/22/2023 0447 by Moishe Spice, RN Outcome: Progressing 02/22/2023 0447 by Moishe Spice, RN Outcome: Progressing   Problem: Metabolic: Goal: Ability to maintain appropriate glucose levels will improve Outcome: Progressing

## 2023-02-23 ENCOUNTER — Encounter (HOSPITAL_COMMUNITY): Payer: Self-pay | Admitting: Orthopedic Surgery

## 2023-02-23 DIAGNOSIS — M86141 Other acute osteomyelitis, right hand: Secondary | ICD-10-CM | POA: Diagnosis not present

## 2023-02-23 DIAGNOSIS — M65141 Other infective (teno)synovitis, right hand: Secondary | ICD-10-CM | POA: Diagnosis present

## 2023-02-23 LAB — CBC WITH DIFFERENTIAL/PLATELET
Abs Immature Granulocytes: 0.19 10*3/uL — ABNORMAL HIGH (ref 0.00–0.07)
Basophils Absolute: 0 10*3/uL (ref 0.0–0.1)
Basophils Relative: 0 %
Eosinophils Absolute: 0 10*3/uL (ref 0.0–0.5)
Eosinophils Relative: 0 %
HCT: 23.6 % — ABNORMAL LOW (ref 36.0–46.0)
Hemoglobin: 7.8 g/dL — ABNORMAL LOW (ref 12.0–15.0)
Immature Granulocytes: 1 %
Lymphocytes Relative: 8 %
Lymphs Abs: 1.2 10*3/uL (ref 0.7–4.0)
MCH: 31.1 pg (ref 26.0–34.0)
MCHC: 33.1 g/dL (ref 30.0–36.0)
MCV: 94 fL (ref 80.0–100.0)
Monocytes Absolute: 0.6 10*3/uL (ref 0.1–1.0)
Monocytes Relative: 4 %
Neutro Abs: 12.7 10*3/uL — ABNORMAL HIGH (ref 1.7–7.7)
Neutrophils Relative %: 87 %
Platelets: 351 10*3/uL (ref 150–400)
RBC: 2.51 MIL/uL — ABNORMAL LOW (ref 3.87–5.11)
RDW: 13.6 % (ref 11.5–15.5)
WBC: 14.7 10*3/uL — ABNORMAL HIGH (ref 4.0–10.5)
nRBC: 0.1 % (ref 0.0–0.2)

## 2023-02-23 LAB — BASIC METABOLIC PANEL
Anion gap: 10 (ref 5–15)
BUN: 88 mg/dL — ABNORMAL HIGH (ref 8–23)
CO2: 21 mmol/L — ABNORMAL LOW (ref 22–32)
Calcium: 8.6 mg/dL — ABNORMAL LOW (ref 8.9–10.3)
Chloride: 110 mmol/L (ref 98–111)
Creatinine, Ser: 3.93 mg/dL — ABNORMAL HIGH (ref 0.44–1.00)
GFR, Estimated: 11 mL/min — ABNORMAL LOW (ref >60)
Glucose, Bld: 199 mg/dL — ABNORMAL HIGH (ref 70–99)
Potassium: 4.6 mmol/L (ref 3.5–5.1)
Sodium: 141 mmol/L (ref 135–145)

## 2023-02-23 LAB — GLUCOSE, CAPILLARY
Glucose-Capillary: 86 mg/dL (ref 70–99)
Glucose-Capillary: 175 mg/dL — ABNORMAL HIGH (ref 70–99)
Glucose-Capillary: 169 mg/dL — ABNORMAL HIGH (ref 70–99)
Glucose-Capillary: 211 mg/dL — ABNORMAL HIGH (ref 70–99)
Glucose-Capillary: 192 mg/dL — ABNORMAL HIGH (ref 70–99)

## 2023-02-23 LAB — PROCALCITONIN: Procalcitonin: 0.61 ng/mL

## 2023-02-23 LAB — AEROBIC/ANAEROBIC CULTURE W GRAM STAIN (SURGICAL/DEEP WOUND): Gram Stain: NONE SEEN

## 2023-02-23 LAB — C-REACTIVE PROTEIN: CRP: 10.3 mg/dL — ABNORMAL HIGH (ref <1.0)

## 2023-02-23 LAB — BRAIN NATRIURETIC PEPTIDE: B Natriuretic Peptide: 308.6 pg/mL — ABNORMAL HIGH (ref 0.0–100.0)

## 2023-02-23 LAB — MAGNESIUM: Magnesium: 1.8 mg/dL (ref 1.7–2.4)

## 2023-02-23 MED ORDER — LOPERAMIDE HCL 2 MG PO CAPS
2.0000 mg | ORAL_CAPSULE | Freq: Four times a day (QID) | ORAL | Status: DC | PRN
  Administered 2023-02-23 – 2023-02-24 (×4): 2 mg via ORAL
  Filled 2023-02-23 (×4): qty 1

## 2023-02-23 MED ORDER — SODIUM CHLORIDE 0.9 % IV SOLN
2.0000 g | INTRAVENOUS | Status: DC
  Administered 2023-02-23: 2 g via INTRAVENOUS
  Filled 2023-02-23 (×2): qty 20

## 2023-02-23 MED ORDER — SODIUM CHLORIDE 0.9 % IV SOLN: INTRAVENOUS | Status: DC

## 2023-02-23 NOTE — Consult Note (Signed)
Date of Admission:  02/19/2023          Reason for Consult: Right middle finger osteomyelitis and abscess sp partial amptuation and I and D    Referring Provider: Susa Raring, MD   Assessment:  Osteomyelitis of 3rd right finger  Flexor tendon infectious synovitis sp Partial amputation of right long finger I&D of flexor tendon sheath and open carpal tunnel release Osteomyelitis involving feet DM History of possible rash to Teflaro History of lethargy while on cephalexin and Flagyl Sulfa allergy Adhesive tape allergy  Plan:  Continue ceftriaxone but I would also like to add more anaerobic coverage--Epic claims she had lethargy with flagyl in the past though not clear that this was truly due to flagyl I am hoping to better finalize IV (and if they exist for her po options) tomorrow to treat her osteomyelitis  Principal Problem:   Finger osteomyelitis, right (HCC) Active Problems:   Diabetes mellitus, type II, insulin dependent (HCC)   Hypertension, essential   CKD (chronic kidney disease) stage 3, GFR 30-59 ml/min (HCC)   Chronic heart failure with preserved ejection fraction (HCC)   Hypomagnesemia   Anemia   Suppurative tenosynovitis of flexor tendon of right hand   Scheduled Meds:  allopurinol  100 mg Oral BID   bisoprolol  10 mg Oral Daily   gabapentin  100 mg Oral BID   insulin aspart  0-20 Units Subcutaneous TID WC   insulin aspart  0-5 Units Subcutaneous QHS   insulin aspart  3 Units Subcutaneous TID WC   insulin glargine-yfgn  50 Units Subcutaneous Daily   multivitamin with minerals  1 tablet Oral Daily   nutrition supplement (JUVEN)  1 packet Oral BID BM   simvastatin  5 mg Oral QHS   Continuous Infusions:  cefTRIAXone (ROCEPHIN)  IV 2 g (02/23/23 0951)   PRN Meds:.acetaminophen **OR** acetaminophen, benzonatate, guaiFENesin-dextromethorphan, HYDROcodone-acetaminophen, loperamide, naLOXone (NARCAN)  injection, ondansetron **OR** ondansetron  (ZOFRAN) IV  HPI: Angela Castro is a 82 y.o. female Beatties mellitus for over a decade chronic kidney disease prior osteomyelitis involving feet who was admitted with right hand and third finger swelling and redness.  He has been followed by wound care and had developed multiple ulcerations in multiple digits but now when seen in their clinic had circumferential progressive erythema and edema extending into her palm she also had frank purulence drainage expressed from the distal aspect of her wound with concerns for flexor tenosynovitis cultures taken by wound care --at [facility] and she was instructed to come to the emergency department for admission.   Cultures at [facility] had shown: Proteus mirabilis that was significant for all the antibiotics against it which it was tested.  She was admitted to [facility] and blood cultures were taken fortunately not grown any organisms.   MRI prior to amputation was read as being consistent with a partial amputation of the long finger at the level of the mid phalanx with ulceration at the tip of the residual long finger extending to bone and underlying osteomyelitis of residual middle phalanx with severe cellulitis along with third flexor tendon infectious tenosynovitis with complete tears of the third FDS and FDP tendons, osteolysis of first second fourth and fifth distal phalanges with some associated marrow edema thought to be acro osteolysis.  This taken to the operating room on 11 October and underwent Right long finger partial amputation I&D of flexor tendon sheath and deep spaces of palm Open carpal tunnel  release    w Dr. Kerry Fort, MD  Microbiological data there is a wound culture labeled on 10 October that grew a probable Pasteurella species as well as viridans streptococci and Proteus  Operative cultures are again yielding a probable Pasteurella species with rare Proteus mirabilis and moderate Streptococcus anginosus with susceptibilities still  pending.  I did separate culture also grew Pasteurella species Proteus as well as beta-lactamase negative but Bacteroides fragilis and some Prevotella  which were positive for beta-lactamase  Note with regards to the Pasteurella species she does have a dog so I expect that likely origin of this organisms was her dog (though she denies the dog is scratched or licked or bitten her it is doubtless the source of one of her Pasteurella since it is not a human microbe)   Pasteurella is a normal part of canine and feline mouth flora and by no means means that her dog is unhealthy.  She is currently on Ceftriaxone .  I wanted to add more anaerobic coverage by adding Flagyl but Flagyl is also blamed for causing lethargy in the past. I want to discuss further with her before we make more final antibiotic plans.  Note she and her husband are very apprehensive about IV antibiotics because she had a bad experience while she was on IV Teflaro.  I note that my partner Dr. Luciana Axe was treating osteomyelitis of her feet with this drug when she was admitted.  Much of her symptoms sound to have been malaise and worsening systemic symptoms in the context of progression of her infection at the time that ultimately required amputations by Dr. Victorino Dike.  She was seen by Dr. Ninetta Lights (my former partner) during that stay and may have had a rash caused by Teflaro. She has multiple other cephalosporin since then including cefepime and is tolerating her ceftriaxone currently.  She very wet much with 1 oral option though we may be quite limited in oral options depending on susceptibility of symptoms  I have personally spent 83 minutes involved in face-to-face and non-face-to-face activities for this patient on the day of the visit. Professional time spent includes the following activities: Preparing to see the patient (review of tests), Obtaining and/or reviewing separately obtained history (admission/discharge record), Performing  a medically appropriate examination and/or evaluation , Ordering medications/tests/procedures, referring and communicating with other health care professionals, Documenting clinical information in the EMR, Independently interpreting results (not separately reported), Communicating results to the patient/family/caregiver, Counseling and educating the patient/family/caregiver and Care coordination (not separately reported).      Review of Systems: Review of Systems  Constitutional:  Negative for chills, fever, malaise/fatigue and weight loss.  HENT:  Negative for congestion and sore throat.   Eyes:  Negative for blurred vision and photophobia.  Respiratory:  Negative for cough, shortness of breath and wheezing.   Cardiovascular:  Negative for chest pain, palpitations and leg swelling.  Gastrointestinal:  Negative for abdominal pain, blood in stool, constipation, diarrhea, heartburn, melena, nausea and vomiting.  Genitourinary:  Negative for dysuria, flank pain and hematuria.  Musculoskeletal:  Positive for myalgias. Negative for back pain, falls and joint pain.  Skin:  Negative for itching and rash.  Neurological:  Negative for dizziness, focal weakness, loss of consciousness, weakness and headaches.  Endo/Heme/Allergies:  Does not bruise/bleed easily.  Psychiatric/Behavioral:  Negative for depression and suicidal ideas. The patient does not have insomnia.     Past Medical History:  Diagnosis Date   Asthma    Diabetes mellitus without  complication (HCC)    Hypertension     Social History   Tobacco Use   Smoking status: Never   Smokeless tobacco: Never  Vaping Use   Vaping status: Never Used  Substance Use Topics   Alcohol use: No   Drug use: No    Family History  Problem Relation Age of Onset   Heart disease Mother    Allergies  Allergen Reactions   Sulfa Antibiotics Shortness Of Breath and Rash   Adhesive [Tape] Other (See Comments)    Causes redness, please use paper  tape   Keflex [Cephalexin] Other (See Comments)    Possibly caused lethargy (taken with Flagyl) Tolerated cefepime 02/20/23   Lisinopril Cough   Metronidazole Other (See Comments)    Possibly caused lethargy (taken with keflex)   Teflaro [Ceftaroline] Other (See Comments)    Lethargy, possibly rash Tolerated cefepime 02/20/23    OBJECTIVE: Blood pressure (!) 153/88, pulse 84, temperature (!) 97.3 F (36.3 C), temperature source Oral, resp. rate (!) 9, height 5' (1.524 m), weight 84.4 kg, SpO2 97%.  Physical Exam Constitutional:      General: She is not in acute distress.    Appearance: She is not diaphoretic.  HENT:     Head: Normocephalic and atraumatic.     Right Ear: External ear normal.     Left Ear: External ear normal.     Nose: Nose normal.     Mouth/Throat:     Pharynx: No oropharyngeal exudate.  Eyes:     General: No scleral icterus.       Right eye: No discharge.        Left eye: No discharge.     Extraocular Movements: Extraocular movements intact.     Conjunctiva/sclera: Conjunctivae normal.  Cardiovascular:     Rate and Rhythm: Normal rate and regular rhythm.  Pulmonary:     Effort: Pulmonary effort is normal. No respiratory distress.     Breath sounds: No wheezing or rales.  Abdominal:     General: There is no distension.     Palpations: Abdomen is soft.     Tenderness: There is no rebound.  Musculoskeletal:     Cervical back: Normal range of motion and neck supple.  Lymphadenopathy:     Cervical: No cervical adenopathy.  Skin:    General: Skin is warm and dry.     Coloration: Skin is not jaundiced or pale.     Findings: No erythema, lesion or rash.  Neurological:     General: No focal deficit present.     Mental Status: She is alert and oriented to person, place, and time.  Psychiatric:        Mood and Affect: Mood normal.        Behavior: Behavior normal.        Thought Content: Thought content normal.        Judgment: Judgment normal.      Hand wrapped on right   Left with several areas of ulceration     Lab Results Lab Results  Component Value Date   WBC 14.7 (H) 02/23/2023   HGB 7.8 (L) 02/23/2023   HCT 23.6 (L) 02/23/2023   MCV 94.0 02/23/2023   PLT 351 02/23/2023    Lab Results  Component Value Date   CREATININE 3.93 (H) 02/23/2023   BUN 88 (H) 02/23/2023   NA 141 02/23/2023   K 4.6 02/23/2023   CL 110 02/23/2023   CO2 21 (L) 02/23/2023  Lab Results  Component Value Date   ALT 19 02/20/2023   AST 29 02/20/2023   ALKPHOS 60 02/20/2023   BILITOT 0.8 02/20/2023     Microbiology: Recent Results (from the past 240 hour(s))  Aerobic Culture w Gram Stain (superficial specimen)     Status: None (Preliminary result)   Collection Time: 02/19/23  2:59 PM   Specimen: Wound  Result Value Ref Range Status   Specimen Description WOUND  Final   Special Requests NONE  Final   Gram Stain   Final    NO WBC SEEN MODERATE GRAM NEGATIVE RODS RARE GRAM POSITIVE COCCI IN PAIRS    Culture   Final    PROBABLE PASTEURELLA SPECIES FEW VIRIDANS STREPTOCOCCUS RARE PROTEUS MIRABILIS CULTURE REINCUBATED FOR BETTER GROWTH    Report Status PENDING  Incomplete   Organism ID, Bacteria VIRIDANS STREPTOCOCCUS  Final      Susceptibility   Viridans streptococcus - MIC*    PENICILLIN 0.25 INTERMEDIATE Intermediate     CEFTRIAXONE 0.5 SENSITIVE Sensitive     ERYTHROMYCIN 2 RESISTANT Resistant     LEVOFLOXACIN 0.5 SENSITIVE Sensitive     VANCOMYCIN Value in next row Sensitive      0.5 SENSITIVEPerformed at [facility], [address] [place], [place] [address]    * FEW VIRIDANS STREPTOCOCCUS  MRSA Next Gen by PCR, Nasal     Status: None   Collection Time: 02/20/23  1:54 AM   Specimen: Nasal Mucosa; Nasal Swab  Result Value Ref Range Status   MRSA by PCR Next Gen NOT DETECTED NOT DETECTED Final    Comment: (NOTE) The GeneXpert MRSA Assay (FDA approved for NASAL specimens only), is one component of a  comprehensive MRSA colonization surveillance program. It is not intended to diagnose MRSA infection nor to guide or monitor treatment for MRSA infections. Test performance is not FDA approved in patients less than 52 years old. Performed at [facility], [address], [place], [place] [address]   Culture, blood (Routine X 2) w Reflex to ID Panel     Status: None (Preliminary result)   Collection Time: 02/20/23  2:57 AM   Specimen: BLOOD LEFT ARM  Result Value Ref Range Status   Specimen Description BLOOD LEFT ARM  Final   Special Requests   Final    BOTTLES DRAWN AEROBIC AND ANAEROBIC Blood Culture adequate volume   Culture   Final    NO GROWTH 3 DAYS Performed at [facility], [address], [place], [place] [address]    Report Status PENDING  Incomplete  Culture, blood (Routine X 2) w Reflex to ID Panel     Status: None (Preliminary result)   Collection Time: 02/20/23  3:11 AM   Specimen: BLOOD  Result Value Ref Range Status   Specimen Description BLOOD LEFT ANTECUBITAL  Final   Special Requests   Final    BOTTLES DRAWN AEROBIC AND ANAEROBIC Blood Culture adequate volume   Culture   Final    NO GROWTH 3 DAYS Performed at [facility], [address] [place], [place] [address]    Report Status PENDING  Incomplete  Aerobic/Anaerobic Culture w Gram Stain (surgical/deep wound)     Status: None   Collection Time: 02/20/23 10:00 PM   Specimen: Wound; Body Fluid  Result Value Ref Range Status   Specimen Description WOUND  Final   Special Requests BONE FINGER  Final   Gram Stain NO WBC SEEN  NO ORGANISMS SEEN   Final   Culture   Final    PROBABLE PASTEURELLA SPECIES RARE STREPTOCOCCUS ANGINOSIS RARE BACTEROIDES SPECIES NOT FRAGILIS BETA LACTAMASE NEGATIVE RARE PREVOTELLA MELANINOGENICA BETA LACTAMASE POSITIVE Performed at [facility], [address] [place], [place] [address]    Report Status 02/23/2023 FINAL  Final  Aerobic/Anaerobic Culture w  Gram Stain (surgical/deep wound)     Status: None (Preliminary result)   Collection Time: 02/20/23 10:00 PM   Specimen: Wound; Body Fluid  Result Value Ref Range Status   Specimen Description WOUND  Final   Special Requests FINGER  Final   Gram Stain   Final    FEW WBC PRESENT,BOTH PMN AND MONONUCLEAR FEW GRAM NEGATIVE RODS FEW GRAM POSITIVE COCCI IN PAIRS    Culture   Final    PROBABLE PASTEURELLA SPECIES RARE PROTEUS MIRABILIS MODERATE STREPTOCOCCUS ANGINOSIS SUSCEPTIBILITIES TO FOLLOW HOLDING FOR POSSIBLE ANAEROBE Performed at [facility], [address], [place], [place] [address]    Report Status PENDING  Incomplete  Aerobic/Anaerobic Culture w Gram Stain (surgical/deep wound)     Status: None   Collection Time: 02/20/23 10:01 PM   Specimen: PATH Bone biopsy; Tissue  Result Value Ref Range Status   Specimen Description WOUND  Final   Special Requests FINGER  Final   Gram Stain   Final    MODERATE WBC PRESENT,BOTH PMN AND MONONUCLEAR FEW GRAM NEGATIVE RODS FEW GRAM POSITIVE COCCI IN PAIRS    Culture   Final    PROBABLE PASTEURELLA SPECIES RARE PROTEUS MIRABILIS FEW BACTEROIDES SPECIES NOT FRAGILIS BETA LACTAMASE NEGATIVE FEW PREVOTELLA MELANINOGENICA POSITIVE Performed at [facility], [address] [place], [place] [address]    Report Status 02/23/2023 FINAL  Final    Acey Lav, MD [facility] [phone] pager  02/23/2023, 4:12 PM

## 2023-02-23 NOTE — Plan of Care (Addendum)
Pt alert and oriented x 4. Up 1 assist with Rolator walker to bsc. Pt received 2 doses of tessalon pearles for cough. 1 dose of tylenol for pain in palm of right hand. Pt went to sleep but woke up again stating pain was still there. Norco given. Pt did not require oxygen until 0320 due to sats dropping to 88%. Bladder scan for shift 0. Vitals stable. Problem: Education: Goal: Knowledge of General Education information will improve Description: Including pain rating scale, medication(s)/side effects and non-pharmacologic comfort measures Outcome: Progressing   Problem: Health Behavior/Discharge Planning: Goal: Ability to manage health-related needs will improve Outcome: Progressing   Problem: Clinical Measurements: Goal: Ability to maintain clinical measurements within normal limits will improve Outcome: Progressing Goal: Will remain free from infection Outcome: Progressing Goal: Diagnostic test results will improve Outcome: Progressing Goal: Respiratory complications will improve Outcome: Progressing Goal: Cardiovascular complication will be avoided Outcome: Progressing   Problem: Activity: Goal: Risk for activity intolerance will decrease Outcome: Progressing   Problem: Nutrition: Goal: Adequate nutrition will be maintained Outcome: Progressing   Problem: Coping: Goal: Level of anxiety will decrease Outcome: Progressing   Problem: Elimination: Goal: Will not experience complications related to bowel motility Outcome: Progressing Goal: Will not experience complications related to urinary retention Outcome: Progressing   Problem: Pain Managment: Goal: General experience of comfort will improve Outcome: Progressing   Problem: Safety: Goal: Ability to remain free from injury will improve Outcome: Progressing   Problem: Skin Integrity: Goal: Risk for impaired skin integrity will decrease Outcome: Progressing   Problem: Education: Goal: Ability to describe self-care  measures that may prevent or decrease complications (Diabetes Survival Skills Education) will improve Outcome: Progressing Goal: Individualized Educational Video(s) Outcome: Progressing   Problem: Coping: Goal: Ability to adjust to condition or change in health will improve Outcome: Progressing   Problem: Fluid Volume: Goal: Ability to maintain a balanced intake and output will improve Outcome: Progressing   Problem: Health Behavior/Discharge Planning: Goal: Ability to identify and utilize available resources and services will improve Outcome: Progressing Goal: Ability to manage health-related needs will improve Outcome: Progressing   Problem: Metabolic: Goal: Ability to maintain appropriate glucose levels will improve Outcome: Progressing   Problem: Nutritional: Goal: Maintenance of adequate nutrition will improve Outcome: Progressing Goal: Progress toward achieving an optimal weight will improve Outcome: Progressing   Problem: Skin Integrity: Goal: Risk for impaired skin integrity will decrease Outcome: Progressing   Problem: Tissue Perfusion: Goal: Adequacy of tissue perfusion will improve Outcome: Progressing   Problem: Fluid Volume: Goal: Hemodynamic stability will improve Outcome: Progressing   Problem: Clinical Measurements: Goal: Diagnostic test results will improve Outcome: Progressing Goal: Signs and symptoms of infection will decrease Outcome: Progressing   Problem: Respiratory: Goal: Ability to maintain adequate ventilation will improve Outcome: Progressing   Problem: Metabolic: Goal: Ability to maintain appropriate glucose levels will improve Outcome: Progressing

## 2023-02-23 NOTE — Care Management Important Message (Signed)
Important Message  Patient Details  Name: Angela Castro MRN: [id number] Date of Birth: December 23, 1940   Important Message Given:  Yes - Medicare IM     Dorena Bodo 02/23/2023, 3:24 PM

## 2023-02-23 NOTE — Progress Notes (Signed)
Abscess culture grew out pasteurella, proteus, and strep anginosis. Strep is intermediate to PCN. D/w Dr. Thedore Mins. We will optimize to ceftriaxone and ID to see.    Ulyses Southward, PharmD, BCIDP, AAHIVP, CPP Infectious Disease Pharmacist 02/23/2023 8:12 AM

## 2023-02-23 NOTE — TOC Progression Note (Signed)
Transition of Care [facility]) - Progression Note    Patient Details  Name: Angela Castro MRN: [id number] Date of Birth: [birth year]  Transition of Care [facility]) CM/SW Contact  Gordy Clement, RN Phone Number: 02/23/2023, 12:54 PM  Clinical Narrative:     CM acknowledges " Consult to [facility] for DME ,Home Health and Medication issues"   PT and OT have evaluted the patient and  have no recommendations for DME  or Home Health. Patient is insured with  [employer] Medicare TOC will continue to follow patient for any additional discharge needs         Expected Discharge Plan and Services                                               Social Determinants of Health (SDOH) Interventions SDOH Screenings   Food Insecurity: No Food Insecurity (02/19/2023)  Housing: Low Risk  (02/19/2023)  Transportation Needs: No Transportation Needs (02/19/2023)  Utilities: Not At Risk (02/19/2023)  Tobacco Use: Low Risk  (02/20/2023)    Readmission Risk Interventions     No data to display

## 2023-02-23 NOTE — Progress Notes (Signed)
PROGRESS NOTE                                                                                                                                                                                                             Patient Demographics:    Angela Castro, is a 82 y.o. female, DOB - 09/20/1940, ZOX:[id number]  Outpatient Primary MD for the patient is Farris Has, MD    LOS - 4  Admit date - 02/19/2023    Chief Complaint  Patient presents with   hand swelling       Brief Narrative (HPI from H&P)   82 y.o. female with medical history significant of DM2, diastolic CHF,  CKD 3, IDA, gout, neuropathy. Presented with  right hand 3rd finger swelling and redness.   Presents with a right hand third finger swelling redness was at wound care yesterday and was told to go to emergency department has history of wounds that required an amputation with underlying history of diabetes there is some drainage of a right hand finger, purulent drainage for the past 3 days. She was originally seen at [facility], ER but left without being seen by provider.  She was diagnosed with severe right hand cellulitis and possible abscess and admitted to the hospital.   Subjective:   Patient in bed, appears comfortable, denies any headache, no fever, no chest pain or pressure, no shortness of breath , no abdominal pain. No focal weakness.   Assessment  & Plan :    Cellulitis, abscess of the right third digit with possible underlying osteomyelitis.  Her infection has been ongoing for the last several days, he has evidence of sepsis upon admission, has been placed on appropriate IV antibiotics, type screen done, MRSA nasal PCR so far is negative.  Hand surgery consulted, MRI noted, patient underwent Right long finger partial amputation, I&D of flexor tendon sheath and deep spaces of palm & Open carpal tunnel release on 02/20/2023 by Ramon Dredge  MD, as noted, ID consulted, clinically improving continue to monitor with PT OT.  Chronic diastolic CHF EF 60% on echocardiogram done 2 years ago.  Currently compensated, n.p.o. due to above may require OR, gentle IV fluids,   AKI on CKD 4.  Baseline creatinine around 2.6.  Hold nephrotoxins, switch vancomycin to Zyvox and monitor.  She has seen  PROGRESS NOTE                                                                                                                                                                                                             Patient Demographics:    Angela Castro, is a 82 y.o. female, DOB - 09/20/1940, ZOX:[id number]  Outpatient Primary MD for the patient is Farris Has, MD    LOS - 4  Admit date - 02/19/2023    Chief Complaint  Patient presents with   hand swelling       Brief Narrative (HPI from H&P)   82 y.o. female with medical history significant of DM2, diastolic CHF,  CKD 3, IDA, gout, neuropathy. Presented with  right hand 3rd finger swelling and redness.   Presents with a right hand third finger swelling redness was at wound care yesterday and was told to go to emergency department has history of wounds that required an amputation with underlying history of diabetes there is some drainage of a right hand finger, purulent drainage for the past 3 days. She was originally seen at [facility], ER but left without being seen by provider.  She was diagnosed with severe right hand cellulitis and possible abscess and admitted to the hospital.   Subjective:   Patient in bed, appears comfortable, denies any headache, no fever, no chest pain or pressure, no shortness of breath , no abdominal pain. No focal weakness.   Assessment  & Plan :    Cellulitis, abscess of the right third digit with possible underlying osteomyelitis.  Her infection has been ongoing for the last several days, he has evidence of sepsis upon admission, has been placed on appropriate IV antibiotics, type screen done, MRSA nasal PCR so far is negative.  Hand surgery consulted, MRI noted, patient underwent Right long finger partial amputation, I&D of flexor tendon sheath and deep spaces of palm & Open carpal tunnel release on 02/20/2023 by Ramon Dredge  MD, as noted, ID consulted, clinically improving continue to monitor with PT OT.  Chronic diastolic CHF EF 60% on echocardiogram done 2 years ago.  Currently compensated, n.p.o. due to above may require OR, gentle IV fluids,   AKI on CKD 4.  Baseline creatinine around 2.6.  Hold nephrotoxins, switch vancomycin to Zyvox and monitor.  She has seen  right costophrenic angle. Scar versus atelectasis within the right base. No consolidative change identified. Thoracolumbar scoliosis deformity. IMPRESSION: 1. Blunting of the right costophrenic angle may reflect a small pleural effusion or pleural thickening. 2. Scar versus atelectasis within the right base. Electronically Signed   By: Signa Kell M.D.   On: 02/22/2023 12:48   US RENAL  Result Date: 02/21/2023 CLINICAL DATA:  Acute kidney injury EXAM: RENAL / URINARY TRACT ULTRASOUND COMPLETE COMPARISON:  02/03/2023 FINDINGS: Right Kidney: Renal measurements: 9 x 3.8 x 4.2 cm = volume: 76.09 mL. Increased parenchymal echogenicity. No mass or hydronephrosis visualized. Left Kidney: Suboptimally visualized. None renal measurements: 9.2 x 5.2 x 4.7 cm = volume: 118.7 mL. Echogenicity within normal limits. No mass or hydronephrosis visualized. Bladder: Appears normal for degree of bladder distention. Other: Fluid noted within Morrison's pouch. IMPRESSION: 1. No acute findings. No hydronephrosis. 2. Increased parenchymal echogenicity of the right kidney compatible with medical renal disease. Electronically Signed   By: Signa Kell M.D.   On: 02/21/2023 10:28   VAS [place] UPPER EXTREMITY ARTERIAL DUPLEX  Result Date: 02/20/2023  UPPER EXTREMITY DUPLEX STUDY Patient Name:  Angela Castro  Date of Exam:   02/20/2023 Medical Rec #: [id number]       Accession #:    [id number] Date of Birth: 1941-04-20       Patient Gender: F Patient Age:   40 years Exam Location:  [facility] Procedure:      VAS [place] UPPER EXTREMITY ARTERIAL DUPLEX Referring Phys: Graceann Congress  --------------------------------------------------------------------------------  Indications: Increased swelling, redness and drainage from her right middle              finger. No pain associated with her fingers.  Risk Factors:  Hypertension, hyperlipidemia, Diabetes. Other Factors: Long history of multiple finger infections and prior auto                amputation of several digits. Performing Technologist: Marilynne Halsted RDMS, RVT  Examination Guidelines: A complete evaluation includes B-mode imaging, spectral Doppler, color Doppler, and power Doppler as needed of all accessible portions of each vessel. Bilateral testing is considered an integral part of a complete examination. Limited examinations for reoccurring indications may be performed as noted.  Right Doppler Findings: +---------------+----------+--------+--------+--------+ Site           PSV (cm/s)WaveformStenosisComments +---------------+----------+--------+--------+--------+ Subclavian Prox125       biphasic                 +---------------+----------+--------+--------+--------+ Subclavian Mid 168       biphasic                 +---------------+----------+--------+--------+--------+ Axillary       135       biphasic                 +---------------+----------+--------+--------+--------+ Brachial Prox  141       biphasic                 +---------------+----------+--------+--------+--------+ Brachial Dist  144       biphasic                 +---------------+----------+--------+--------+--------+ Radial Prox    102                                +---------------+----------+--------+--------+--------+ Radial Mid     87                                 +---------------+----------+--------+--------+--------+  PROGRESS NOTE                                                                                                                                                                                                             Patient Demographics:    Angela Castro, is a 82 y.o. female, DOB - 09/20/1940, ZOX:[id number]  Outpatient Primary MD for the patient is Farris Has, MD    LOS - 4  Admit date - 02/19/2023    Chief Complaint  Patient presents with   hand swelling       Brief Narrative (HPI from H&P)   82 y.o. female with medical history significant of DM2, diastolic CHF,  CKD 3, IDA, gout, neuropathy. Presented with  right hand 3rd finger swelling and redness.   Presents with a right hand third finger swelling redness was at wound care yesterday and was told to go to emergency department has history of wounds that required an amputation with underlying history of diabetes there is some drainage of a right hand finger, purulent drainage for the past 3 days. She was originally seen at [facility], ER but left without being seen by provider.  She was diagnosed with severe right hand cellulitis and possible abscess and admitted to the hospital.   Subjective:   Patient in bed, appears comfortable, denies any headache, no fever, no chest pain or pressure, no shortness of breath , no abdominal pain. No focal weakness.   Assessment  & Plan :    Cellulitis, abscess of the right third digit with possible underlying osteomyelitis.  Her infection has been ongoing for the last several days, he has evidence of sepsis upon admission, has been placed on appropriate IV antibiotics, type screen done, MRSA nasal PCR so far is negative.  Hand surgery consulted, MRI noted, patient underwent Right long finger partial amputation, I&D of flexor tendon sheath and deep spaces of palm & Open carpal tunnel release on 02/20/2023 by Ramon Dredge  MD, as noted, ID consulted, clinically improving continue to monitor with PT OT.  Chronic diastolic CHF EF 60% on echocardiogram done 2 years ago.  Currently compensated, n.p.o. due to above may require OR, gentle IV fluids,   AKI on CKD 4.  Baseline creatinine around 2.6.  Hold nephrotoxins, switch vancomycin to Zyvox and monitor.  She has seen  right costophrenic angle. Scar versus atelectasis within the right base. No consolidative change identified. Thoracolumbar scoliosis deformity. IMPRESSION: 1. Blunting of the right costophrenic angle may reflect a small pleural effusion or pleural thickening. 2. Scar versus atelectasis within the right base. Electronically Signed   By: Signa Kell M.D.   On: 02/22/2023 12:48   US RENAL  Result Date: 02/21/2023 CLINICAL DATA:  Acute kidney injury EXAM: RENAL / URINARY TRACT ULTRASOUND COMPLETE COMPARISON:  02/03/2023 FINDINGS: Right Kidney: Renal measurements: 9 x 3.8 x 4.2 cm = volume: 76.09 mL. Increased parenchymal echogenicity. No mass or hydronephrosis visualized. Left Kidney: Suboptimally visualized. None renal measurements: 9.2 x 5.2 x 4.7 cm = volume: 118.7 mL. Echogenicity within normal limits. No mass or hydronephrosis visualized. Bladder: Appears normal for degree of bladder distention. Other: Fluid noted within Morrison's pouch. IMPRESSION: 1. No acute findings. No hydronephrosis. 2. Increased parenchymal echogenicity of the right kidney compatible with medical renal disease. Electronically Signed   By: Signa Kell M.D.   On: 02/21/2023 10:28   VAS [place] UPPER EXTREMITY ARTERIAL DUPLEX  Result Date: 02/20/2023  UPPER EXTREMITY DUPLEX STUDY Patient Name:  Angela Castro  Date of Exam:   02/20/2023 Medical Rec #: [id number]       Accession #:    [id number] Date of Birth: 1941-04-20       Patient Gender: F Patient Age:   40 years Exam Location:  [facility] Procedure:      VAS [place] UPPER EXTREMITY ARTERIAL DUPLEX Referring Phys: Graceann Congress  --------------------------------------------------------------------------------  Indications: Increased swelling, redness and drainage from her right middle              finger. No pain associated with her fingers.  Risk Factors:  Hypertension, hyperlipidemia, Diabetes. Other Factors: Long history of multiple finger infections and prior auto                amputation of several digits. Performing Technologist: Marilynne Halsted RDMS, RVT  Examination Guidelines: A complete evaluation includes B-mode imaging, spectral Doppler, color Doppler, and power Doppler as needed of all accessible portions of each vessel. Bilateral testing is considered an integral part of a complete examination. Limited examinations for reoccurring indications may be performed as noted.  Right Doppler Findings: +---------------+----------+--------+--------+--------+ Site           PSV (cm/s)WaveformStenosisComments +---------------+----------+--------+--------+--------+ Subclavian Prox125       biphasic                 +---------------+----------+--------+--------+--------+ Subclavian Mid 168       biphasic                 +---------------+----------+--------+--------+--------+ Axillary       135       biphasic                 +---------------+----------+--------+--------+--------+ Brachial Prox  141       biphasic                 +---------------+----------+--------+--------+--------+ Brachial Dist  144       biphasic                 +---------------+----------+--------+--------+--------+ Radial Prox    102                                +---------------+----------+--------+--------+--------+ Radial Mid     87                                 +---------------+----------+--------+--------+--------+  right costophrenic angle. Scar versus atelectasis within the right base. No consolidative change identified. Thoracolumbar scoliosis deformity. IMPRESSION: 1. Blunting of the right costophrenic angle may reflect a small pleural effusion or pleural thickening. 2. Scar versus atelectasis within the right base. Electronically Signed   By: Signa Kell M.D.   On: 02/22/2023 12:48   US RENAL  Result Date: 02/21/2023 CLINICAL DATA:  Acute kidney injury EXAM: RENAL / URINARY TRACT ULTRASOUND COMPLETE COMPARISON:  02/03/2023 FINDINGS: Right Kidney: Renal measurements: 9 x 3.8 x 4.2 cm = volume: 76.09 mL. Increased parenchymal echogenicity. No mass or hydronephrosis visualized. Left Kidney: Suboptimally visualized. None renal measurements: 9.2 x 5.2 x 4.7 cm = volume: 118.7 mL. Echogenicity within normal limits. No mass or hydronephrosis visualized. Bladder: Appears normal for degree of bladder distention. Other: Fluid noted within Morrison's pouch. IMPRESSION: 1. No acute findings. No hydronephrosis. 2. Increased parenchymal echogenicity of the right kidney compatible with medical renal disease. Electronically Signed   By: Signa Kell M.D.   On: 02/21/2023 10:28   VAS [place] UPPER EXTREMITY ARTERIAL DUPLEX  Result Date: 02/20/2023  UPPER EXTREMITY DUPLEX STUDY Patient Name:  Angela Castro  Date of Exam:   02/20/2023 Medical Rec #: [id number]       Accession #:    [id number] Date of Birth: 1941-04-20       Patient Gender: F Patient Age:   40 years Exam Location:  [facility] Procedure:      VAS [place] UPPER EXTREMITY ARTERIAL DUPLEX Referring Phys: Graceann Congress  --------------------------------------------------------------------------------  Indications: Increased swelling, redness and drainage from her right middle              finger. No pain associated with her fingers.  Risk Factors:  Hypertension, hyperlipidemia, Diabetes. Other Factors: Long history of multiple finger infections and prior auto                amputation of several digits. Performing Technologist: Marilynne Halsted RDMS, RVT  Examination Guidelines: A complete evaluation includes B-mode imaging, spectral Doppler, color Doppler, and power Doppler as needed of all accessible portions of each vessel. Bilateral testing is considered an integral part of a complete examination. Limited examinations for reoccurring indications may be performed as noted.  Right Doppler Findings: +---------------+----------+--------+--------+--------+ Site           PSV (cm/s)WaveformStenosisComments +---------------+----------+--------+--------+--------+ Subclavian Prox125       biphasic                 +---------------+----------+--------+--------+--------+ Subclavian Mid 168       biphasic                 +---------------+----------+--------+--------+--------+ Axillary       135       biphasic                 +---------------+----------+--------+--------+--------+ Brachial Prox  141       biphasic                 +---------------+----------+--------+--------+--------+ Brachial Dist  144       biphasic                 +---------------+----------+--------+--------+--------+ Radial Prox    102                                +---------------+----------+--------+--------+--------+ Radial Mid     87                                 +---------------+----------+--------+--------+--------+  PROGRESS NOTE                                                                                                                                                                                                             Patient Demographics:    Angela Castro, is a 82 y.o. female, DOB - 09/20/1940, ZOX:[id number]  Outpatient Primary MD for the patient is Farris Has, MD    LOS - 4  Admit date - 02/19/2023    Chief Complaint  Patient presents with   hand swelling       Brief Narrative (HPI from H&P)   82 y.o. female with medical history significant of DM2, diastolic CHF,  CKD 3, IDA, gout, neuropathy. Presented with  right hand 3rd finger swelling and redness.   Presents with a right hand third finger swelling redness was at wound care yesterday and was told to go to emergency department has history of wounds that required an amputation with underlying history of diabetes there is some drainage of a right hand finger, purulent drainage for the past 3 days. She was originally seen at [facility], ER but left without being seen by provider.  She was diagnosed with severe right hand cellulitis and possible abscess and admitted to the hospital.   Subjective:   Patient in bed, appears comfortable, denies any headache, no fever, no chest pain or pressure, no shortness of breath , no abdominal pain. No focal weakness.   Assessment  & Plan :    Cellulitis, abscess of the right third digit with possible underlying osteomyelitis.  Her infection has been ongoing for the last several days, he has evidence of sepsis upon admission, has been placed on appropriate IV antibiotics, type screen done, MRSA nasal PCR so far is negative.  Hand surgery consulted, MRI noted, patient underwent Right long finger partial amputation, I&D of flexor tendon sheath and deep spaces of palm & Open carpal tunnel release on 02/20/2023 by Ramon Dredge  MD, as noted, ID consulted, clinically improving continue to monitor with PT OT.  Chronic diastolic CHF EF 60% on echocardiogram done 2 years ago.  Currently compensated, n.p.o. due to above may require OR, gentle IV fluids,   AKI on CKD 4.  Baseline creatinine around 2.6.  Hold nephrotoxins, switch vancomycin to Zyvox and monitor.  She has seen  PROGRESS NOTE                                                                                                                                                                                                             Patient Demographics:    Angela Castro, is a 82 y.o. female, DOB - 09/20/1940, ZOX:[id number]  Outpatient Primary MD for the patient is Farris Has, MD    LOS - 4  Admit date - 02/19/2023    Chief Complaint  Patient presents with   hand swelling       Brief Narrative (HPI from H&P)   82 y.o. female with medical history significant of DM2, diastolic CHF,  CKD 3, IDA, gout, neuropathy. Presented with  right hand 3rd finger swelling and redness.   Presents with a right hand third finger swelling redness was at wound care yesterday and was told to go to emergency department has history of wounds that required an amputation with underlying history of diabetes there is some drainage of a right hand finger, purulent drainage for the past 3 days. She was originally seen at [facility], ER but left without being seen by provider.  She was diagnosed with severe right hand cellulitis and possible abscess and admitted to the hospital.   Subjective:   Patient in bed, appears comfortable, denies any headache, no fever, no chest pain or pressure, no shortness of breath , no abdominal pain. No focal weakness.   Assessment  & Plan :    Cellulitis, abscess of the right third digit with possible underlying osteomyelitis.  Her infection has been ongoing for the last several days, he has evidence of sepsis upon admission, has been placed on appropriate IV antibiotics, type screen done, MRSA nasal PCR so far is negative.  Hand surgery consulted, MRI noted, patient underwent Right long finger partial amputation, I&D of flexor tendon sheath and deep spaces of palm & Open carpal tunnel release on 02/20/2023 by Ramon Dredge  MD, as noted, ID consulted, clinically improving continue to monitor with PT OT.  Chronic diastolic CHF EF 60% on echocardiogram done 2 years ago.  Currently compensated, n.p.o. due to above may require OR, gentle IV fluids,   AKI on CKD 4.  Baseline creatinine around 2.6.  Hold nephrotoxins, switch vancomycin to Zyvox and monitor.  She has seen

## 2023-02-23 NOTE — Progress Notes (Signed)
Nephrology Follow-Up Consult note   Assessment/Recommendations: Angela Castro is a/an 82 y.o. female with a past medical history significant for DM2, diastolic CHF, CKD 4, gout, admitted for hand infection and AKI.       Non-Oliguric AKI on CKD 4: Likely secondary to volume depletion and likely liver injury in the setting of sepsis -Creatinine slowly improving, hold fluids today -Continue holding diuretics and ARB -Continue to monitor daily Cr, Dose meds for GFR -Monitor Daily I/Os, Daily weight  -Maintain MAP>65 for optimal renal perfusion.  -Avoid nephrotoxic medications including NSAIDs -Use synthetic opioids (Fentanyl/Dilaudid) if needed -Patient has no interest in dialysis in the long-term -we will plan to set up outpatient follow-up and labs at the time of discharge  Diastolic heart failure: Some lower extremity edema but patient status this is at her baseline.  Continue holding diuretics.  Stop hydration.  Hypertension: Blood pressure on the lower side.  Continue holding home blood pressure medications  Cellulitis with abscess of the right third digit and possible osteomyelitis: Status post partial amputation and I&D.  On IV antibiotics per primary team.  Anemia: iron deficiency.  Inflammation also contributing.  Consider transfusion per primary team.  Hold on iron given infection.  Consider ESA  Uncontrolled Diabetes Mellitus Type 2 with Hyperglycemia: Management per primary team   Recommendations conveyed to primary service.    Darnell Level [facility] 02/23/2023 11:30 AM  ___________________________________________________________  CC: Hand infection  Interval History/Subjective: Patient states she feels much better.  Hard to say what feels better but overall feeling more like herself.  Feels like she has been urinating well.  Creatinine slightly improved to 3.9.  Feels like her edema is at her baseline.  Probably even better than it normally  is.   Medications:  Current Facility-Administered Medications  Medication Dose Route Frequency Provider Last Rate Last Admin   acetaminophen (TYLENOL) tablet 650 mg  650 mg Oral Q6H PRN Therisa Doyne, MD   650 mg at 02/22/23 2224   Or   acetaminophen (TYLENOL) suppository 650 mg  650 mg Rectal Q6H PRN Doutova, Jonny Ruiz, MD       allopurinol (ZYLOPRIM) tablet 100 mg  100 mg Oral BID Doutova, Anastassia, MD   100 mg at 02/23/23 9604   benzonatate (TESSALON) capsule 100 mg  100 mg Oral TID PRN Leroy Sea, MD   100 mg at 02/23/23 0647   bisoprolol (ZEBETA) tablet 10 mg  10 mg Oral Daily Doutova, Anastassia, MD   10 mg at 02/23/23 5409   cefTRIAXone (ROCEPHIN) 2 g in sodium chloride 0.9 % 100 mL IVPB  2 g Intravenous Q24H Pham, Minh Q, RPH-CPP 200 mL/hr at 02/23/23 0951 2 g at 02/23/23 0951   gabapentin (NEURONTIN) capsule 100 mg  100 mg Oral BID Leroy Sea, MD   100 mg at 02/23/23 8119   guaiFENesin-dextromethorphan (ROBITUSSIN DM) 100-10 MG/5ML syrup 10 mL  10 mL Oral Q4H PRN Leroy Sea, MD       HYDROcodone-acetaminophen (NORCO/VICODIN) 5-325 MG per tablet 1 tablet  1 tablet Oral Q4H PRN Leroy Sea, MD   1 tablet at 02/23/23 0047   insulin aspart (novoLOG) injection 0-20 Units  0-20 Units Subcutaneous TID WC Leroy Sea, MD   4 Units at 02/23/23 0923   insulin aspart (novoLOG) injection 0-5 Units  0-5 Units Subcutaneous QHS Leroy Sea, MD   3 Units at 02/22/23 2226   insulin aspart (novoLOG) injection 3 Units  3  Units Subcutaneous TID WC Leroy Sea, MD   3 Units at 02/23/23 0922   insulin glargine-yfgn [facility]) injection 50 Units  50 Units Subcutaneous Daily Leroy Sea, MD   50 Units at 02/23/23 1610   multivitamin with minerals tablet 1 tablet  1 tablet Oral Daily Leroy Sea, MD   1 tablet at 02/23/23 9604   naloxone [facility]) injection 0.4 mg  0.4 mg Intravenous PRN Leroy Sea, MD       nutrition supplement (JUVEN)  (JUVEN) powder packet 1 packet  1 packet Oral BID BM Leroy Sea, MD   1 packet at 02/23/23 0924   ondansetron (ZOFRAN) tablet 4 mg  4 mg Oral Q6H PRN Therisa Doyne, MD       Or   ondansetron (ZOFRAN) injection 4 mg  4 mg Intravenous Q6H PRN Doutova, Anastassia, MD       simvastatin (ZOCOR) tablet 5 mg  5 mg Oral QHS Doutova, Anastassia, MD   5 mg at 02/22/23 2225      Review of Systems: 10 systems reviewed and negative except per interval history/subjective  Physical Exam: Vitals:   02/23/23 0400 02/23/23 0800  BP: 132/63   Pulse: 85   Resp: 15   Temp: 98.1 F (36.7 C) (!) 97.5 F (36.4 C)  SpO2: 96% 98%   No intake/output data recorded.  Intake/Output Summary (Last 24 hours) at 02/23/2023 1130 Last data filed at 02/23/2023 0300 Gross per 24 hour  Intake 2444.95 ml  Output 400 ml  Net 2044.95 ml   Constitutional: well-appearing, no acute distress ENMT: ears and nose without scars or lesions, MMM CV: normal rate, 1+ edema in the bilateral lower extremities Respiratory: Bilateral chest rise, normal work of breathing Gastrointestinal: soft, non-tender, no palpable masses or hernias Skin: Right hand wrapped in gauze, otherwise no visible lesions or rashes Psych: alert, judgement/insight appropriate, appropriate mood and affect   Test Results I personally reviewed new and old clinical labs and radiology tests Lab Results  Component Value Date   NA 141 02/23/2023   K 4.6 02/23/2023   CL 110 02/23/2023   CO2 21 (L) 02/23/2023   BUN 88 (H) 02/23/2023   CREATININE 3.93 (H) 02/23/2023   CALCIUM 8.6 (L) 02/23/2023   ALBUMIN 1.9 (L) 02/20/2023   PHOS 4.3 02/20/2023    CBC Recent Labs  Lab 02/21/23 0245 02/22/23 0739 02/23/23 0443  WBC 17.2* 16.6* 14.7*  NEUTROABS 16.4* 15.0* 12.7*  HGB 8.1* 8.4* 7.8*  HCT 25.0* 25.4* 23.6*  MCV 96.5 93.7 94.0  PLT 277 360 351

## 2023-02-23 NOTE — TOC Progression Note (Signed)
Transition of Care [facility]) - Progression Note    Patient Details  Name: Angela Castro MRN: [id number] Date of Birth: [birth year]  Transition of Care [facility]) CM/SW Contact  Gordy Clement, RN Phone Number: 02/23/2023, 2:45 PM  Clinical Narrative:     CM received secure chat that [employer] wanted to speak to someone regarding DC plans. CM called and no answer  Will try again later or in am. Anticipated DC date for patient is 2-3 days per Progresssion.  There are no recommendations for [facility] or DME at this time  .   TOC will continue to follow patient for any additional discharge needs            Expected Discharge Plan and Services                                               Social Determinants of Health (SDOH) Interventions SDOH Screenings   Food Insecurity: No Food Insecurity (02/19/2023)  Housing: Low Risk  (02/19/2023)  Transportation Needs: No Transportation Needs (02/19/2023)  Utilities: Not At Risk (02/19/2023)  Tobacco Use: Low Risk  (02/20/2023)    Readmission Risk Interventions     No data to display

## 2023-02-23 NOTE — Progress Notes (Signed)
Mobility Specialist Progress Note;   02/23/23 1140  Mobility  Activity Ambulated with assistance in hallway  Level of Assistance Contact guard assist, steadying assist  Assistive Device Four wheel walker  Distance Ambulated (ft) 75 ft  RUE Weight Bearing WBAT (through elbow only)  Activity Response Tolerated well  Mobility Referral Yes  $Mobility charge 1 Mobility  Mobility Specialist Start Time (ACUTE ONLY) 1140  Mobility Specialist Stop Time (ACUTE ONLY) 1205  Mobility Specialist Time Calculation (min) (ACUTE ONLY) 25 min   Pt agreeable to mobility. Received on [facility] w/ tech in room, void successful. Required minG assistance during ambulation. C/o slight SOB towards EOS. VSS throughout session. Pt back in chair with all needs met.   Caesar Bookman Mobility Specialist Please contact via SecureChat or Rehab Office [phone]

## 2023-02-24 DIAGNOSIS — M86141 Other acute osteomyelitis, right hand: Secondary | ICD-10-CM | POA: Diagnosis not present

## 2023-02-24 LAB — BASIC METABOLIC PANEL
Anion gap: 10 (ref 5–15)
BUN: 95 mg/dL — ABNORMAL HIGH (ref 8–23)
CO2: 22 mmol/L (ref 22–32)
Calcium: 8.7 mg/dL — ABNORMAL LOW (ref 8.9–10.3)
Chloride: 111 mmol/L (ref 98–111)
Creatinine, Ser: 3.38 mg/dL — ABNORMAL HIGH (ref 0.44–1.00)
GFR, Estimated: 13 mL/min — ABNORMAL LOW (ref >60)
Glucose, Bld: 77 mg/dL (ref 70–99)
Potassium: 4.5 mmol/L (ref 3.5–5.1)
Sodium: 143 mmol/L (ref 135–145)

## 2023-02-24 LAB — CBC WITH DIFFERENTIAL/PLATELET
Abs Immature Granulocytes: 0.21 10*3/uL — ABNORMAL HIGH (ref 0.00–0.07)
Basophils Absolute: 0 10*3/uL (ref 0.0–0.1)
Basophils Relative: 0 %
Eosinophils Absolute: 0.9 10*3/uL — ABNORMAL HIGH (ref 0.0–0.5)
Eosinophils Relative: 7 %
HCT: 23.8 % — ABNORMAL LOW (ref 36.0–46.0)
Hemoglobin: 7.8 g/dL — ABNORMAL LOW (ref 12.0–15.0)
Immature Granulocytes: 2 %
Lymphocytes Relative: 16 %
Lymphs Abs: 2 10*3/uL (ref 0.7–4.0)
MCH: 31 pg (ref 26.0–34.0)
MCHC: 32.8 g/dL (ref 30.0–36.0)
MCV: 94.4 fL (ref 80.0–100.0)
Monocytes Absolute: 0.8 10*3/uL (ref 0.1–1.0)
Monocytes Relative: 7 %
Neutro Abs: 8.6 10*3/uL — ABNORMAL HIGH (ref 1.7–7.7)
Neutrophils Relative %: 68 %
Platelets: 376 10*3/uL (ref 150–400)
RBC: 2.52 MIL/uL — ABNORMAL LOW (ref 3.87–5.11)
RDW: 13.7 % (ref 11.5–15.5)
WBC: 12.6 10*3/uL — ABNORMAL HIGH (ref 4.0–10.5)
nRBC: 0 % (ref 0.0–0.2)

## 2023-02-24 LAB — AEROBIC/ANAEROBIC CULTURE W GRAM STAIN (SURGICAL/DEEP WOUND)

## 2023-02-24 LAB — BRAIN NATRIURETIC PEPTIDE: B Natriuretic Peptide: 232.4 pg/mL — ABNORMAL HIGH (ref 0.0–100.0)

## 2023-02-24 LAB — GLUCOSE, CAPILLARY
Glucose-Capillary: 54 mg/dL — ABNORMAL LOW (ref 70–99)
Glucose-Capillary: 148 mg/dL — ABNORMAL HIGH (ref 70–99)
Glucose-Capillary: 141 mg/dL — ABNORMAL HIGH (ref 70–99)
Glucose-Capillary: 183 mg/dL — ABNORMAL HIGH (ref 70–99)
Glucose-Capillary: 248 mg/dL — ABNORMAL HIGH (ref 70–99)

## 2023-02-24 LAB — C-REACTIVE PROTEIN: CRP: 5.5 mg/dL — ABNORMAL HIGH (ref <1.0)

## 2023-02-24 LAB — PROCALCITONIN: Procalcitonin: 0.45 ng/mL

## 2023-02-24 LAB — FERRITIN: Ferritin: 161 ng/mL (ref 11–307)

## 2023-02-24 LAB — IRON AND TIBC
Iron: 44 ug/dL (ref 28–170)
Saturation Ratios: 18 % (ref 10.4–31.8)
TIBC: 242 ug/dL — ABNORMAL LOW (ref 250–450)
UIBC: 198 ug/dL

## 2023-02-24 LAB — SURGICAL PATHOLOGY

## 2023-02-24 LAB — MAGNESIUM: Magnesium: 1.6 mg/dL — ABNORMAL LOW (ref 1.7–2.4)

## 2023-02-24 MED ORDER — SODIUM CHLORIDE 0.9 % IV SOLN
250.0000 mg | Freq: Every day | INTRAVENOUS | Status: DC
  Administered 2023-02-24 – 2023-02-25 (×2): 250 mg via INTRAVENOUS
  Filled 2023-02-24 (×3): qty 20

## 2023-02-24 MED ORDER — INSULIN ASPART 100 UNIT/ML IJ SOLN
0.0000 [IU] | Freq: Every day | INTRAMUSCULAR | Status: DC
  Administered 2023-02-24: 2 [IU] via SUBCUTANEOUS

## 2023-02-24 MED ORDER — TORSEMIDE 20 MG PO TABS
40.0000 mg | ORAL_TABLET | Freq: Every day | ORAL | Status: DC
  Administered 2023-02-24 – 2023-02-25 (×2): 40 mg via ORAL
  Filled 2023-02-24 (×2): qty 2

## 2023-02-24 MED ORDER — MAGNESIUM SULFATE 4 GM/100ML IV SOLN
4.0000 g | Freq: Once | INTRAVENOUS | Status: AC
  Administered 2023-02-24: 4 g via INTRAVENOUS
  Filled 2023-02-24: qty 100

## 2023-02-24 MED ORDER — INSULIN ASPART 100 UNIT/ML IJ SOLN
0.0000 [IU] | Freq: Three times a day (TID) | INTRAMUSCULAR | Status: DC
  Administered 2023-02-24: 1 [IU] via SUBCUTANEOUS
  Administered 2023-02-24: 2 [IU] via SUBCUTANEOUS
  Administered 2023-02-25: 1 [IU] via SUBCUTANEOUS

## 2023-02-24 MED ORDER — DEXTROSE 50 % IV SOLN
INTRAVENOUS | Status: AC
  Administered 2023-02-24: 12.5 g via INTRAVENOUS
  Filled 2023-02-24: qty 50

## 2023-02-24 MED ORDER — AMOXICILLIN-POT CLAVULANATE 875-125 MG PO TABS
1.0000 | ORAL_TABLET | Freq: Two times a day (BID) | ORAL | Status: DC
  Administered 2023-02-24 – 2023-02-25 (×3): 1 via ORAL
  Filled 2023-02-24 (×3): qty 1

## 2023-02-24 MED ORDER — INSULIN GLARGINE-YFGN 100 UNIT/ML ~~LOC~~ SOLN
40.0000 [IU] | Freq: Every day | SUBCUTANEOUS | Status: DC
  Filled 2023-02-24 (×2): qty 0.4

## 2023-02-24 MED ORDER — DEXTROSE 50 % IV SOLN: 12.5000 g | Freq: Once | INTRAVENOUS | Status: AC

## 2023-02-24 NOTE — Progress Notes (Signed)
Nephrology Follow-Up Consult note   Assessment/Recommendations: Angela Castro is a/an 82 y.o. female with a past medical history significant for DM2, diastolic CHF, CKD 4, gout, admitted for hand infection and AKI.       Non-Oliguric AKI on CKD 4: Likely secondary to volume depletion and likely liver injury in the setting of sepsis -Creatinine slowly improving, -Starting diuretics as below -Continue holding diuretics ARB -Continue to monitor daily Cr, Dose meds for GFR -Monitor Daily I/Os, Daily weight  -Maintain MAP>65 for optimal renal perfusion.  -Avoid nephrotoxic medications including NSAIDs -Use synthetic opioids (Fentanyl/Dilaudid) if needed -Patient has no interest in dialysis in the long-term -we will plan to set up outpatient follow-up and labs at the time of discharge  Diastolic heart failure: Chronic lower extremity edema.  Worsening today.  On Bumex 2 mg daily outpatient.  Will start torsemide 40 mg daily today and continue to monitor  Hypertension: Blood pressure on the lower side.  Continue holding home blood pressure medications  Cellulitis with abscess of the right third digit and possible osteomyelitis: Status post partial amputation and I&D.  On oral antibiotics now  Anemia: iron deficiency.  Inflammation also contributing.  Consider transfusion per primary team.  Infection now significantly improved on oral antibiotics.  Iron levels fairly low.  Will do IV iron given risk of worsening infection seems low.  Could consider ESA  Uncontrolled Diabetes Mellitus Type 2 with Hyperglycemia: Management per primary team   Recommendations conveyed to primary service.    Darnell Level [facility] 02/24/2023 11:35 AM  ___________________________________________________________  CC: Hand infection  Interval History/Subjective: Feels about the same today.  Feels like her edema is a little bit worse than normal for her.  Creatinine down to  3.4.   Medications:  Current Facility-Administered Medications  Medication Dose Route Frequency Provider Last Rate Last Admin   acetaminophen (TYLENOL) tablet 650 mg  650 mg Oral Q6H PRN Therisa Doyne, MD   650 mg at 02/23/23 1827   Or   acetaminophen (TYLENOL) suppository 650 mg  650 mg Rectal Q6H PRN Doutova, Anastassia, MD       allopurinol (ZYLOPRIM) tablet 100 mg  100 mg Oral BID Doutova, Anastassia, MD   100 mg at 02/24/23 0926   amoxicillin-clavulanate (AUGMENTIN) 875-125 MG per tablet 1 tablet  1 tablet Oral Q12H Daiva Eves, Lisette Grinder, MD       benzonatate (TESSALON) capsule 100 mg  100 mg Oral TID PRN Leroy Sea, MD   100 mg at 02/23/23 2315   bisoprolol (ZEBETA) tablet 10 mg  10 mg Oral Daily Doutova, Jonny Ruiz, MD   10 mg at 02/24/23 0454   ferric gluconate (FERRLECIT) 250 mg in sodium chloride 0.9 % 250 mL IVPB  250 mg Intravenous Daily Darnell Level, MD       gabapentin (NEURONTIN) capsule 100 mg  100 mg Oral BID Leroy Sea, MD   100 mg at 02/24/23 0927   guaiFENesin-dextromethorphan (ROBITUSSIN DM) 100-10 MG/5ML syrup 10 mL  10 mL Oral Q4H PRN Leroy Sea, MD       HYDROcodone-acetaminophen (NORCO/VICODIN) 5-325 MG per tablet 1 tablet  1 tablet Oral Q4H PRN Leroy Sea, MD   1 tablet at 02/23/23 0047   insulin aspart (novoLOG) injection 0-5 Units  0-5 Units Subcutaneous QHS Leroy Sea, MD       insulin aspart (novoLOG) injection 0-9 Units  0-9 Units Subcutaneous TID WC Leroy Sea, MD  insulin glargine-yfgn (SEMGLEE) injection 40 Units  40 Units Subcutaneous Daily Leroy Sea, MD       loperamide (IMODIUM) capsule 2 mg  2 mg Oral Q6H PRN Leroy Sea, MD   2 mg at 02/24/23 4098   multivitamin with minerals tablet 1 tablet  1 tablet Oral Daily Leroy Sea, MD   1 tablet at 02/24/23 0926   naloxone [facility]) injection 0.4 mg  0.4 mg Intravenous PRN Leroy Sea, MD       nutrition supplement (JUVEN) (JUVEN)  powder packet 1 packet  1 packet Oral BID BM Leroy Sea, MD   1 packet at 02/24/23 0927   ondansetron (ZOFRAN) tablet 4 mg  4 mg Oral Q6H PRN Therisa Doyne, MD       Or   ondansetron (ZOFRAN) injection 4 mg  4 mg Intravenous Q6H PRN Therisa Doyne, MD       simvastatin (ZOCOR) tablet 5 mg  5 mg Oral QHS Therisa Doyne, MD   5 mg at 02/23/23 2316   torsemide (DEMADEX) tablet 40 mg  40 mg Oral Daily Darnell Level, MD          Review of Systems: 10 systems reviewed and negative except per interval history/subjective  Physical Exam: Vitals:   02/24/23 0425 02/24/23 0800  BP: (!) 161/67 (!) 163/75  Pulse: 82 80  Resp: 15 14  Temp: 97.9 F (36.6 C)   SpO2: 94% 95%   Total I/O In: 520 [P.O.:520] Out: -   Intake/Output Summary (Last 24 hours) at 02/24/2023 1135 Last data filed at 02/24/2023 0900 Gross per 24 hour  Intake 636.44 ml  Output --  Net 636.44 ml   Constitutional: well-appearing, no acute distress ENMT: ears and nose without scars or lesions, MMM CV: normal rate, 3+ edema in the bilateral lower extremities Respiratory: Bilateral chest rise, normal work of breathing Gastrointestinal: soft, non-tender, no palpable masses or hernias Skin: Right hand wrapped in gauze, otherwise no visible lesions or rashes Psych: alert, judgement/insight appropriate, appropriate mood and affect   Test Results I personally reviewed new and old clinical labs and radiology tests Lab Results  Component Value Date   NA 143 02/24/2023   K 4.5 02/24/2023   CL 111 02/24/2023   CO2 22 02/24/2023   BUN 95 (H) 02/24/2023   CREATININE 3.38 (H) 02/24/2023   CALCIUM 8.7 (L) 02/24/2023   ALBUMIN 1.9 (L) 02/20/2023   PHOS 4.3 02/20/2023    CBC Recent Labs  Lab 02/22/23 0739 02/23/23 0443 02/24/23 0315  WBC 16.6* 14.7* 12.6*  NEUTROABS 15.0* 12.7* 8.6*  HGB 8.4* 7.8* 7.8*  HCT 25.4* 23.6* 23.8*  MCV 93.7 94.0 94.4  PLT 360 351 376

## 2023-02-24 NOTE — Progress Notes (Signed)
FSBS rechecked and in the 140's. Patient states she feels much better and more like herself.

## 2023-02-24 NOTE — Progress Notes (Signed)
PROGRESS NOTE                                                                                                                                                                                                             Patient Demographics:    Angela Castro, is a 82 y.o. female, DOB - 28-Nov-1940, ZOX:[id number]  Outpatient Primary MD for the patient is Farris Has, MD    LOS - 5  Admit date - 02/19/2023    Chief Complaint  Patient presents with   hand swelling       Brief Narrative (HPI from H&P)   82 y.o. female with medical history significant of DM2, diastolic CHF,  CKD 3, IDA, gout, neuropathy. Presented with  right hand 3rd finger swelling and redness.   Presents with a right hand third finger swelling redness was at wound care yesterday and was told to go to emergency department has history of wounds that required an amputation with underlying history of diabetes there is some drainage of a right hand finger, purulent drainage for the past 3 days. She was originally seen at [facility], ER but left without being seen by provider.  She was diagnosed with severe right hand cellulitis and possible abscess and admitted to the hospital.   Subjective:   Patient in bed, appears comfortable, denies any headache, no fever, no chest pain or pressure, no shortness of breath , no abdominal pain. No focal weakness.   Assessment  & Plan :    Cellulitis, abscess of the right third digit with possible underlying osteomyelitis.  Her infection has been ongoing for the last several days, he has evidence of sepsis upon admission, has been placed on appropriate IV antibiotics, type screen done, MRSA nasal PCR so far is negative.  Hand surgery consulted, MRI noted, patient underwent Right long finger partial amputation, I&D of flexor tendon sheath and deep spaces of palm & Open carpal tunnel release on 02/20/2023 by Ramon Dredge  MD, as noted, ID consulted, ID to give their recommendations on antibiotics course and duration on 02/24/2023, may require PICC line most likely will be placed in the IJ area if PICC line required by IR as she has CKD 4, infection is clinically improving continue to monitor with PT OT.  Quested orthopedics to reevaluate on 02/24/2023 in terms of any  [address] [place], [place] [address]     Report Status PENDING  Incomplete  Aerobic/Anaerobic Culture w Gram Stain (surgical/deep wound)     Status: None   Collection Time: 02/20/23 10:01 PM   Specimen: PATH Bone biopsy; Tissue  Result Value Ref Range Status   Specimen Description WOUND  Final   Special Requests FINGER  Final   Gram Stain   Final    MODERATE WBC PRESENT,BOTH PMN AND MONONUCLEAR FEW GRAM NEGATIVE RODS FEW GRAM POSITIVE COCCI IN PAIRS    Culture   Final    PROBABLE PASTEURELLA SPECIES RARE PROTEUS MIRABILIS FEW BACTEROIDES SPECIES NOT FRAGILIS BETA LACTAMASE NEGATIVE FEW PREVOTELLA MELANINOGENICA POSITIVE Performed at [facility], [address] [place], [place] [address]    Report Status 02/23/2023 FINAL  Final    Radiology Reports DG Chest Port 1 View  Result Date: 02/22/2023 CLINICAL DATA:  Shortness of breath EXAM: PORTABLE CHEST 1 VIEW COMPARISON:  12/30/2022 FINDINGS: Heart size is mildly enlarged. There is blunting of the right costophrenic angle. Scar versus atelectasis within the right base. No consolidative change identified. Thoracolumbar scoliosis deformity. IMPRESSION: 1. Blunting of the right costophrenic angle may reflect a small pleural effusion or pleural thickening. 2. Scar versus atelectasis within the right base. Electronically Signed   By: Signa Kell M.D.   On: 02/22/2023 12:48   US RENAL  Result Date: 02/21/2023 CLINICAL DATA:  Acute kidney injury EXAM: RENAL / URINARY TRACT ULTRASOUND COMPLETE COMPARISON:  02/03/2023 FINDINGS: Right Kidney: Renal measurements: 9 x 3.8 x 4.2 cm = volume: 76.09 mL. Increased parenchymal echogenicity. No mass or hydronephrosis visualized. Left Kidney: Suboptimally visualized. None renal measurements: 9.2 x 5.2 x 4.7 cm = volume: 118.7 mL. Echogenicity within normal limits. No mass or hydronephrosis visualized. Bladder: Appears normal for degree of bladder distention. Other: Fluid noted within Morrison's pouch. IMPRESSION: 1. No acute findings.  No hydronephrosis. 2. Increased parenchymal echogenicity of the right kidney compatible with medical renal disease. Electronically Signed   By: Signa Kell M.D.   On: 02/21/2023 10:28   VAS [place] UPPER EXTREMITY ARTERIAL DUPLEX  Result Date: 02/20/2023  UPPER EXTREMITY DUPLEX STUDY Patient Name:  Angela Castro  Date of Exam:   02/20/2023 Medical Rec #: [id number]       Accession #:    [id number] Date of Birth: Jul 26, 1940       Patient Gender: F Patient Age:   9 years Exam Location:  [facility] Procedure:      VAS [place] UPPER EXTREMITY ARTERIAL DUPLEX Referring Phys: Graceann Congress --------------------------------------------------------------------------------  Indications: Increased swelling, redness and drainage from her right middle              finger. No pain associated with her fingers.  Risk Factors:  Hypertension, hyperlipidemia, Diabetes. Other Factors: Long history of multiple finger infections and prior auto                amputation of several digits. Performing Technologist: Marilynne Halsted RDMS, RVT  Examination Guidelines: A complete evaluation includes B-mode imaging, spectral Doppler, color Doppler, and power Doppler as needed of all accessible portions of each vessel. Bilateral testing is considered an integral part of a complete examination. Limited examinations for reoccurring indications may be performed as noted.  Right Doppler Findings: +---------------+----------+--------+--------+--------+ Site           PSV (cm/s)WaveformStenosisComments +---------------+----------+--------+--------+--------+ Subclavian Prox125       biphasic                 +---------------+----------+--------+--------+--------+  [address] [place], [place] [address]     Report Status PENDING  Incomplete  Aerobic/Anaerobic Culture w Gram Stain (surgical/deep wound)     Status: None   Collection Time: 02/20/23 10:01 PM   Specimen: PATH Bone biopsy; Tissue  Result Value Ref Range Status   Specimen Description WOUND  Final   Special Requests FINGER  Final   Gram Stain   Final    MODERATE WBC PRESENT,BOTH PMN AND MONONUCLEAR FEW GRAM NEGATIVE RODS FEW GRAM POSITIVE COCCI IN PAIRS    Culture   Final    PROBABLE PASTEURELLA SPECIES RARE PROTEUS MIRABILIS FEW BACTEROIDES SPECIES NOT FRAGILIS BETA LACTAMASE NEGATIVE FEW PREVOTELLA MELANINOGENICA POSITIVE Performed at [facility], [address] [place], [place] [address]    Report Status 02/23/2023 FINAL  Final    Radiology Reports DG Chest Port 1 View  Result Date: 02/22/2023 CLINICAL DATA:  Shortness of breath EXAM: PORTABLE CHEST 1 VIEW COMPARISON:  12/30/2022 FINDINGS: Heart size is mildly enlarged. There is blunting of the right costophrenic angle. Scar versus atelectasis within the right base. No consolidative change identified. Thoracolumbar scoliosis deformity. IMPRESSION: 1. Blunting of the right costophrenic angle may reflect a small pleural effusion or pleural thickening. 2. Scar versus atelectasis within the right base. Electronically Signed   By: Signa Kell M.D.   On: 02/22/2023 12:48   US RENAL  Result Date: 02/21/2023 CLINICAL DATA:  Acute kidney injury EXAM: RENAL / URINARY TRACT ULTRASOUND COMPLETE COMPARISON:  02/03/2023 FINDINGS: Right Kidney: Renal measurements: 9 x 3.8 x 4.2 cm = volume: 76.09 mL. Increased parenchymal echogenicity. No mass or hydronephrosis visualized. Left Kidney: Suboptimally visualized. None renal measurements: 9.2 x 5.2 x 4.7 cm = volume: 118.7 mL. Echogenicity within normal limits. No mass or hydronephrosis visualized. Bladder: Appears normal for degree of bladder distention. Other: Fluid noted within Morrison's pouch. IMPRESSION: 1. No acute findings.  No hydronephrosis. 2. Increased parenchymal echogenicity of the right kidney compatible with medical renal disease. Electronically Signed   By: Signa Kell M.D.   On: 02/21/2023 10:28   VAS [place] UPPER EXTREMITY ARTERIAL DUPLEX  Result Date: 02/20/2023  UPPER EXTREMITY DUPLEX STUDY Patient Name:  Angela Castro  Date of Exam:   02/20/2023 Medical Rec #: [id number]       Accession #:    [id number] Date of Birth: Jul 26, 1940       Patient Gender: F Patient Age:   9 years Exam Location:  [facility] Procedure:      VAS [place] UPPER EXTREMITY ARTERIAL DUPLEX Referring Phys: Graceann Congress --------------------------------------------------------------------------------  Indications: Increased swelling, redness and drainage from her right middle              finger. No pain associated with her fingers.  Risk Factors:  Hypertension, hyperlipidemia, Diabetes. Other Factors: Long history of multiple finger infections and prior auto                amputation of several digits. Performing Technologist: Marilynne Halsted RDMS, RVT  Examination Guidelines: A complete evaluation includes B-mode imaging, spectral Doppler, color Doppler, and power Doppler as needed of all accessible portions of each vessel. Bilateral testing is considered an integral part of a complete examination. Limited examinations for reoccurring indications may be performed as noted.  Right Doppler Findings: +---------------+----------+--------+--------+--------+ Site           PSV (cm/s)WaveformStenosisComments +---------------+----------+--------+--------+--------+ Subclavian Prox125       biphasic                 +---------------+----------+--------+--------+--------+  [address] [place], [place] [address]     Report Status PENDING  Incomplete  Aerobic/Anaerobic Culture w Gram Stain (surgical/deep wound)     Status: None   Collection Time: 02/20/23 10:01 PM   Specimen: PATH Bone biopsy; Tissue  Result Value Ref Range Status   Specimen Description WOUND  Final   Special Requests FINGER  Final   Gram Stain   Final    MODERATE WBC PRESENT,BOTH PMN AND MONONUCLEAR FEW GRAM NEGATIVE RODS FEW GRAM POSITIVE COCCI IN PAIRS    Culture   Final    PROBABLE PASTEURELLA SPECIES RARE PROTEUS MIRABILIS FEW BACTEROIDES SPECIES NOT FRAGILIS BETA LACTAMASE NEGATIVE FEW PREVOTELLA MELANINOGENICA POSITIVE Performed at [facility], [address] [place], [place] [address]    Report Status 02/23/2023 FINAL  Final    Radiology Reports DG Chest Port 1 View  Result Date: 02/22/2023 CLINICAL DATA:  Shortness of breath EXAM: PORTABLE CHEST 1 VIEW COMPARISON:  12/30/2022 FINDINGS: Heart size is mildly enlarged. There is blunting of the right costophrenic angle. Scar versus atelectasis within the right base. No consolidative change identified. Thoracolumbar scoliosis deformity. IMPRESSION: 1. Blunting of the right costophrenic angle may reflect a small pleural effusion or pleural thickening. 2. Scar versus atelectasis within the right base. Electronically Signed   By: Signa Kell M.D.   On: 02/22/2023 12:48   US RENAL  Result Date: 02/21/2023 CLINICAL DATA:  Acute kidney injury EXAM: RENAL / URINARY TRACT ULTRASOUND COMPLETE COMPARISON:  02/03/2023 FINDINGS: Right Kidney: Renal measurements: 9 x 3.8 x 4.2 cm = volume: 76.09 mL. Increased parenchymal echogenicity. No mass or hydronephrosis visualized. Left Kidney: Suboptimally visualized. None renal measurements: 9.2 x 5.2 x 4.7 cm = volume: 118.7 mL. Echogenicity within normal limits. No mass or hydronephrosis visualized. Bladder: Appears normal for degree of bladder distention. Other: Fluid noted within Morrison's pouch. IMPRESSION: 1. No acute findings.  No hydronephrosis. 2. Increased parenchymal echogenicity of the right kidney compatible with medical renal disease. Electronically Signed   By: Signa Kell M.D.   On: 02/21/2023 10:28   VAS [place] UPPER EXTREMITY ARTERIAL DUPLEX  Result Date: 02/20/2023  UPPER EXTREMITY DUPLEX STUDY Patient Name:  Angela Castro  Date of Exam:   02/20/2023 Medical Rec #: [id number]       Accession #:    [id number] Date of Birth: Jul 26, 1940       Patient Gender: F Patient Age:   9 years Exam Location:  [facility] Procedure:      VAS [place] UPPER EXTREMITY ARTERIAL DUPLEX Referring Phys: Graceann Congress --------------------------------------------------------------------------------  Indications: Increased swelling, redness and drainage from her right middle              finger. No pain associated with her fingers.  Risk Factors:  Hypertension, hyperlipidemia, Diabetes. Other Factors: Long history of multiple finger infections and prior auto                amputation of several digits. Performing Technologist: Marilynne Halsted RDMS, RVT  Examination Guidelines: A complete evaluation includes B-mode imaging, spectral Doppler, color Doppler, and power Doppler as needed of all accessible portions of each vessel. Bilateral testing is considered an integral part of a complete examination. Limited examinations for reoccurring indications may be performed as noted.  Right Doppler Findings: +---------------+----------+--------+--------+--------+ Site           PSV (cm/s)WaveformStenosisComments +---------------+----------+--------+--------+--------+ Subclavian Prox125       biphasic                 +---------------+----------+--------+--------+--------+  [address] [place], [place] [address]     Report Status PENDING  Incomplete  Aerobic/Anaerobic Culture w Gram Stain (surgical/deep wound)     Status: None   Collection Time: 02/20/23 10:01 PM   Specimen: PATH Bone biopsy; Tissue  Result Value Ref Range Status   Specimen Description WOUND  Final   Special Requests FINGER  Final   Gram Stain   Final    MODERATE WBC PRESENT,BOTH PMN AND MONONUCLEAR FEW GRAM NEGATIVE RODS FEW GRAM POSITIVE COCCI IN PAIRS    Culture   Final    PROBABLE PASTEURELLA SPECIES RARE PROTEUS MIRABILIS FEW BACTEROIDES SPECIES NOT FRAGILIS BETA LACTAMASE NEGATIVE FEW PREVOTELLA MELANINOGENICA POSITIVE Performed at [facility], [address] [place], [place] [address]    Report Status 02/23/2023 FINAL  Final    Radiology Reports DG Chest Port 1 View  Result Date: 02/22/2023 CLINICAL DATA:  Shortness of breath EXAM: PORTABLE CHEST 1 VIEW COMPARISON:  12/30/2022 FINDINGS: Heart size is mildly enlarged. There is blunting of the right costophrenic angle. Scar versus atelectasis within the right base. No consolidative change identified. Thoracolumbar scoliosis deformity. IMPRESSION: 1. Blunting of the right costophrenic angle may reflect a small pleural effusion or pleural thickening. 2. Scar versus atelectasis within the right base. Electronically Signed   By: Signa Kell M.D.   On: 02/22/2023 12:48   US RENAL  Result Date: 02/21/2023 CLINICAL DATA:  Acute kidney injury EXAM: RENAL / URINARY TRACT ULTRASOUND COMPLETE COMPARISON:  02/03/2023 FINDINGS: Right Kidney: Renal measurements: 9 x 3.8 x 4.2 cm = volume: 76.09 mL. Increased parenchymal echogenicity. No mass or hydronephrosis visualized. Left Kidney: Suboptimally visualized. None renal measurements: 9.2 x 5.2 x 4.7 cm = volume: 118.7 mL. Echogenicity within normal limits. No mass or hydronephrosis visualized. Bladder: Appears normal for degree of bladder distention. Other: Fluid noted within Morrison's pouch. IMPRESSION: 1. No acute findings.  No hydronephrosis. 2. Increased parenchymal echogenicity of the right kidney compatible with medical renal disease. Electronically Signed   By: Signa Kell M.D.   On: 02/21/2023 10:28   VAS [place] UPPER EXTREMITY ARTERIAL DUPLEX  Result Date: 02/20/2023  UPPER EXTREMITY DUPLEX STUDY Patient Name:  Angela Castro  Date of Exam:   02/20/2023 Medical Rec #: [id number]       Accession #:    [id number] Date of Birth: Jul 26, 1940       Patient Gender: F Patient Age:   9 years Exam Location:  [facility] Procedure:      VAS [place] UPPER EXTREMITY ARTERIAL DUPLEX Referring Phys: Graceann Congress --------------------------------------------------------------------------------  Indications: Increased swelling, redness and drainage from her right middle              finger. No pain associated with her fingers.  Risk Factors:  Hypertension, hyperlipidemia, Diabetes. Other Factors: Long history of multiple finger infections and prior auto                amputation of several digits. Performing Technologist: Marilynne Halsted RDMS, RVT  Examination Guidelines: A complete evaluation includes B-mode imaging, spectral Doppler, color Doppler, and power Doppler as needed of all accessible portions of each vessel. Bilateral testing is considered an integral part of a complete examination. Limited examinations for reoccurring indications may be performed as noted.  Right Doppler Findings: +---------------+----------+--------+--------+--------+ Site           PSV (cm/s)WaveformStenosisComments +---------------+----------+--------+--------+--------+ Subclavian Prox125       biphasic                 +---------------+----------+--------+--------+--------+  PROGRESS NOTE                                                                                                                                                                                                             Patient Demographics:    Angela Castro, is a 82 y.o. female, DOB - 28-Nov-1940, ZOX:[id number]  Outpatient Primary MD for the patient is Farris Has, MD    LOS - 5  Admit date - 02/19/2023    Chief Complaint  Patient presents with   hand swelling       Brief Narrative (HPI from H&P)   82 y.o. female with medical history significant of DM2, diastolic CHF,  CKD 3, IDA, gout, neuropathy. Presented with  right hand 3rd finger swelling and redness.   Presents with a right hand third finger swelling redness was at wound care yesterday and was told to go to emergency department has history of wounds that required an amputation with underlying history of diabetes there is some drainage of a right hand finger, purulent drainage for the past 3 days. She was originally seen at [facility], ER but left without being seen by provider.  She was diagnosed with severe right hand cellulitis and possible abscess and admitted to the hospital.   Subjective:   Patient in bed, appears comfortable, denies any headache, no fever, no chest pain or pressure, no shortness of breath , no abdominal pain. No focal weakness.   Assessment  & Plan :    Cellulitis, abscess of the right third digit with possible underlying osteomyelitis.  Her infection has been ongoing for the last several days, he has evidence of sepsis upon admission, has been placed on appropriate IV antibiotics, type screen done, MRSA nasal PCR so far is negative.  Hand surgery consulted, MRI noted, patient underwent Right long finger partial amputation, I&D of flexor tendon sheath and deep spaces of palm & Open carpal tunnel release on 02/20/2023 by Ramon Dredge  MD, as noted, ID consulted, ID to give their recommendations on antibiotics course and duration on 02/24/2023, may require PICC line most likely will be placed in the IJ area if PICC line required by IR as she has CKD 4, infection is clinically improving continue to monitor with PT OT.  Quested orthopedics to reevaluate on 02/24/2023 in terms of any  PROGRESS NOTE                                                                                                                                                                                                             Patient Demographics:    Angela Castro, is a 82 y.o. female, DOB - 28-Nov-1940, ZOX:[id number]  Outpatient Primary MD for the patient is Farris Has, MD    LOS - 5  Admit date - 02/19/2023    Chief Complaint  Patient presents with   hand swelling       Brief Narrative (HPI from H&P)   82 y.o. female with medical history significant of DM2, diastolic CHF,  CKD 3, IDA, gout, neuropathy. Presented with  right hand 3rd finger swelling and redness.   Presents with a right hand third finger swelling redness was at wound care yesterday and was told to go to emergency department has history of wounds that required an amputation with underlying history of diabetes there is some drainage of a right hand finger, purulent drainage for the past 3 days. She was originally seen at [facility], ER but left without being seen by provider.  She was diagnosed with severe right hand cellulitis and possible abscess and admitted to the hospital.   Subjective:   Patient in bed, appears comfortable, denies any headache, no fever, no chest pain or pressure, no shortness of breath , no abdominal pain. No focal weakness.   Assessment  & Plan :    Cellulitis, abscess of the right third digit with possible underlying osteomyelitis.  Her infection has been ongoing for the last several days, he has evidence of sepsis upon admission, has been placed on appropriate IV antibiotics, type screen done, MRSA nasal PCR so far is negative.  Hand surgery consulted, MRI noted, patient underwent Right long finger partial amputation, I&D of flexor tendon sheath and deep spaces of palm & Open carpal tunnel release on 02/20/2023 by Ramon Dredge  MD, as noted, ID consulted, ID to give their recommendations on antibiotics course and duration on 02/24/2023, may require PICC line most likely will be placed in the IJ area if PICC line required by IR as she has CKD 4, infection is clinically improving continue to monitor with PT OT.  Quested orthopedics to reevaluate on 02/24/2023 in terms of any

## 2023-02-24 NOTE — Progress Notes (Signed)
Subjective:  No new complaints   Antibiotics:  Anti-infectives (From admission, onward)    Start     Dose/Rate Route Frequency Ordered Stop   02/24/23 1100  amoxicillin-clavulanate (AUGMENTIN) 875-125 MG per tablet 1 tablet        1 tablet Oral Every 12 hours 02/24/23 0930     02/23/23 0900  cefTRIAXone (ROCEPHIN) 2 g in sodium chloride 0.9 % 100 mL IVPB  Status:  Discontinued        2 g 200 mL/hr over 30 Minutes Intravenous Every 24 hours 02/23/23 0810 02/24/23 0930   02/21/23 1500  vancomycin (VANCOREADY) IVPB 500 mg/100 mL  Status:  Discontinued        500 mg 100 mL/hr over 60 Minutes Intravenous Every 48 hours 02/19/23 2205 02/20/23 0938   02/21/23 0800  linezolid (ZYVOX) tablet 600 mg  Status:  Discontinued        600 mg Oral 2 times daily 02/20/23 0938 02/23/23 0810   02/19/23 2300  ceFEPIme (MAXIPIME) 2 g in sodium chloride 0.9 % 100 mL IVPB  Status:  Discontinued        2 g 200 mL/hr over 30 Minutes Intravenous Every 24 hours 02/19/23 2205 02/23/23 0810   02/19/23 1500  vancomycin (VANCOCIN) IVPB 1000 mg/200 mL premix        1,000 mg 200 mL/hr over 60 Minutes Intravenous  Once 02/19/23 1448 02/19/23 1808   02/19/23 1445  vancomycin (VANCOCIN) IVPB 1000 mg/200 mL premix        1,000 mg 200 mL/hr over 60 Minutes Intravenous  Once 02/19/23 1443 02/19/23 1640       Medications: Scheduled Meds:  allopurinol  100 mg Oral BID   amoxicillin-clavulanate  1 tablet Oral Q12H   bisoprolol  10 mg Oral Daily   gabapentin  100 mg Oral BID   insulin aspart  0-5 Units Subcutaneous QHS   insulin aspart  0-9 Units Subcutaneous TID WC   insulin glargine-yfgn  40 Units Subcutaneous Daily   multivitamin with minerals  1 tablet Oral Daily   nutrition supplement (JUVEN)  1 packet Oral BID BM   simvastatin  5 mg Oral QHS   torsemide  40 mg Oral Daily   Continuous Infusions:  ferric gluconate (FERRLECIT) IVPB 250 mg (02/24/23 1347)   PRN Meds:.acetaminophen **OR**  acetaminophen, benzonatate, guaiFENesin-dextromethorphan, HYDROcodone-acetaminophen, loperamide, naLOXone (NARCAN)  injection, ondansetron **OR** ondansetron (ZOFRAN) IV    Objective: Weight change: -0.3 kg  Intake/Output Summary (Last 24 hours) at 02/24/2023 1352 Last data filed at 02/24/2023 0900 Gross per 24 hour  Intake 636.44 ml  Output --  Net 636.44 ml   Blood pressure 114/89, pulse 82, temperature 97.6 F (36.4 C), temperature source Oral, resp. rate 18, height 5' (1.524 m), weight 84.1 kg, SpO2 97%. Temp:  [97.6 F (36.4 C)-97.9 F (36.6 C)] 97.6 F (36.4 C) (10/15 1156) Pulse Rate:  [80-91] 82 (10/15 1156) Resp:  [14-18] 18 (10/15 1156) BP: (114-163)/(64-89) 114/89 (10/15 1156) SpO2:  [91 %-97 %] 97 % (10/15 1156) Weight:  [84.1 kg] 84.1 kg (10/15 0500)  Physical Exam: Physical Exam Constitutional:      General: She is not in acute distress.    Appearance: She is well-developed. She is not diaphoretic.  HENT:     Head: Normocephalic and atraumatic.     Right Ear: External ear normal.     Left Ear: External ear normal.     Mouth/Throat:  Pharynx: No oropharyngeal exudate.  Eyes:     General: No scleral icterus.    Conjunctiva/sclera: Conjunctivae normal.     Pupils: Pupils are equal, round, and reactive to light.  Cardiovascular:     Rate and Rhythm: Normal rate and regular rhythm.  Pulmonary:     Effort: Pulmonary effort is normal. No respiratory distress.     Breath sounds: Normal breath sounds. No wheezing or rales.  Abdominal:     General: Bowel sounds are normal. There is no distension.     Palpations: Abdomen is soft.     Tenderness: There is no abdominal tenderness. There is no rebound.  Lymphadenopathy:     Cervical: No cervical adenopathy.  Skin:    General: Skin is warm and dry.     Coloration: Skin is not pale.     Findings: No erythema or rash.  Neurological:     General: No focal deficit present.     Mental Status: She is alert and  oriented to person, place, and time.     Motor: No abnormal muscle tone.     Coordination: Coordination normal.  Psychiatric:        Mood and Affect: Mood normal.        Behavior: Behavior normal.        Thought Content: Thought content normal.        Judgment: Judgment normal.      Dressing being changed  CBC:    BMET Recent Labs    02/23/23 0443 02/24/23 0315  NA 141 143  K 4.6 4.5  CL 110 111  CO2 21* 22  GLUCOSE 199* 77  BUN 88* 95*  CREATININE 3.93* 3.38*  CALCIUM 8.6* 8.7*     Liver Panel  No results for input(s): "PROT", "ALBUMIN", "AST", "ALT", "ALKPHOS", "BILITOT", "BILIDIR", "IBILI" in the last 72 hours.     Sedimentation Rate No results for input(s): "ESRSEDRATE" in the last 72 hours. C-Reactive Protein Recent Labs    02/23/23 0443 02/24/23 0315  CRP 10.3* 5.5*    Micro Results: Recent Results (from the past 720 hour(s))  Aerobic Culture w Gram Stain (superficial specimen)     Status: None (Preliminary result)   Collection Time: 02/19/23  2:59 PM   Specimen: Wound  Result Value Ref Range Status   Specimen Description WOUND  Final   Special Requests NONE  Final   Gram Stain   Final    NO WBC SEEN MODERATE GRAM NEGATIVE RODS RARE GRAM POSITIVE COCCI IN PAIRS    Culture   Final    PROBABLE PASTEURELLA SPECIES FEW VIRIDANS STREPTOCOCCUS RARE PROTEUS MIRABILIS SUSCEPTIBILITIES TO FOLLOW Performed at [facility], [address], [place], [place] [address]    Report Status PENDING  Incomplete   Organism ID, Bacteria VIRIDANS STREPTOCOCCUS  Final      Susceptibility   Viridans streptococcus - MIC*    PENICILLIN 0.25 INTERMEDIATE Intermediate     CEFTRIAXONE 0.5 SENSITIVE Sensitive     ERYTHROMYCIN 2 RESISTANT Resistant     LEVOFLOXACIN 0.5 SENSITIVE Sensitive     VANCOMYCIN 0.5 SENSITIVE Sensitive     * FEW VIRIDANS STREPTOCOCCUS  MRSA Next Gen by PCR, Nasal     Status: None   Collection Time: 02/20/23  1:54 AM   Specimen:  Nasal Mucosa; Nasal Swab  Result Value Ref Range Status   MRSA by PCR Next Gen NOT DETECTED NOT DETECTED Final    Comment: (NOTE) The GeneXpert MRSA Assay (  FDA approved for NASAL specimens only), is one component of a comprehensive MRSA colonization surveillance program. It is not intended to diagnose MRSA infection nor to guide or monitor treatment for MRSA infections. Test performance is not FDA approved in patients less than 81 years old. Performed at [facility], [address], [place], [place] [address]   Culture, blood (Routine X 2) w Reflex to ID Panel     Status: None (Preliminary result)   Collection Time: 02/20/23  2:57 AM   Specimen: BLOOD LEFT ARM  Result Value Ref Range Status   Specimen Description BLOOD LEFT ARM  Final   Special Requests   Final    BOTTLES DRAWN AEROBIC AND ANAEROBIC Blood Culture adequate volume   Culture   Final    NO GROWTH 4 DAYS Performed at [facility], [address], [place], [place] [address]    Report Status PENDING  Incomplete  Culture, blood (Routine X 2) w Reflex to ID Panel     Status: None (Preliminary result)   Collection Time: 02/20/23  3:11 AM   Specimen: BLOOD  Result Value Ref Range Status   Specimen Description BLOOD LEFT ANTECUBITAL  Final   Special Requests   Final    BOTTLES DRAWN AEROBIC AND ANAEROBIC Blood Culture adequate volume   Culture   Final    NO GROWTH 4 DAYS Performed at [facility], [address] [place], [place] [address]    Report Status PENDING  Incomplete  Aerobic/Anaerobic Culture w Gram Stain (surgical/deep wound)     Status: None   Collection Time: 02/20/23 10:00 PM   Specimen: Wound; Body Fluid  Result Value Ref Range Status   Specimen Description WOUND  Final   Special Requests BONE FINGER  Final   Gram Stain NO WBC SEEN NO ORGANISMS SEEN   Final   Culture   Final    PROBABLE PASTEURELLA SPECIES RARE STREPTOCOCCUS ANGINOSIS RARE BACTEROIDES SPECIES NOT FRAGILIS BETA  LACTAMASE NEGATIVE RARE PREVOTELLA MELANINOGENICA BETA LACTAMASE POSITIVE Performed at [facility], [address] [place], [place] [address]    Report Status 02/23/2023 FINAL  Final  Aerobic/Anaerobic Culture w Gram Stain (surgical/deep wound)     Status: None (Preliminary result)   Collection Time: 02/20/23 10:00 PM   Specimen: Wound; Body Fluid  Result Value Ref Range Status   Specimen Description WOUND  Final   Special Requests FINGER  Final   Gram Stain   Final    FEW WBC PRESENT,BOTH PMN AND MONONUCLEAR FEW GRAM NEGATIVE RODS FEW GRAM POSITIVE COCCI IN PAIRS    Culture   Final    PROBABLE PASTEURELLA SPECIES RARE PROTEUS MIRABILIS MODERATE STREPTOCOCCUS ANGINOSIS HOLDING FOR POSSIBLE ANAEROBE Performed at [facility], [address], [place], [place] [address]    Report Status PENDING  Incomplete   Organism ID, Bacteria PROTEUS MIRABILIS  Final   Organism ID, Bacteria STREPTOCOCCUS ANGINOSIS  Final      Susceptibility   Proteus mirabilis - MIC*    AMPICILLIN <=2 SENSITIVE Sensitive     CEFEPIME <=0.12 SENSITIVE Sensitive     CEFTAZIDIME <=1 SENSITIVE Sensitive     CEFTRIAXONE <=0.25 SENSITIVE Sensitive     CIPROFLOXACIN <=0.25 SENSITIVE Sensitive     GENTAMICIN <=1 SENSITIVE Sensitive     IMIPENEM 1 SENSITIVE Sensitive     TRIMETH/SULFA <=20 SENSITIVE Sensitive     AMPICILLIN/SULBACTAM <=2 SENSITIVE Sensitive     PIP/TAZO <=4 SENSITIVE  Sensitive ug/mL    * RARE PROTEUS MIRABILIS   Streptococcus anginosis - MIC*    PENICILLIN <=0.06 SENSITIVE Sensitive     CEFTRIAXONE <=0.12 SENSITIVE Sensitive     ERYTHROMYCIN >=8 RESISTANT Resistant     LEVOFLOXACIN 0.5 SENSITIVE Sensitive     VANCOMYCIN 0.5 SENSITIVE Sensitive     * MODERATE STREPTOCOCCUS ANGINOSIS  Aerobic/Anaerobic Culture w Gram Stain (surgical/deep wound)     Status: None   Collection Time: 02/20/23 10:01 PM   Specimen: PATH Bone biopsy; Tissue  Result Value Ref Range Status   Specimen  Description WOUND  Final   Special Requests FINGER  Final   Gram Stain   Final    MODERATE WBC PRESENT,BOTH PMN AND MONONUCLEAR FEW GRAM NEGATIVE RODS FEW GRAM POSITIVE COCCI IN PAIRS    Culture   Final    PROBABLE PASTEURELLA SPECIES RARE PROTEUS MIRABILIS FEW BACTEROIDES SPECIES NOT FRAGILIS BETA LACTAMASE NEGATIVE FEW PREVOTELLA MELANINOGENICA POSITIVE Performed at [facility] Lab, [address], [place], [place] [address]    Report Status 02/23/2023 FINAL  Final    Studies/Results: No results found.    Assessment/Plan:  INTERVAL HISTORY: sensitivies back on cultures    Principal Problem:   Finger osteomyelitis, right (HCC) Active Problems:   Diabetes mellitus, type II, insulin dependent (HCC)   Hypertension, essential   CKD (chronic kidney disease) stage 3, GFR 30-59 ml/min (HCC)   Chronic heart failure with preserved ejection fraction (HCC)   Hypomagnesemia   Anemia   Suppurative tenosynovitis of flexor tendon of right hand    Shariece Viveiros is a 82 y.o. female with  DM, CKD prior osteomyelitis , PVD, now with osteomyelitis of 3rd finger with flexor tenosynovitis and hand abscess status post I&D.  Cultures have yielded Proteus mirabilis, Pastuerella speices, Viridans on wound swab culture and  Proteus mirabilis Pasteurella species Streptococcus anginosus  Bacteroides fragilis and Prevotella  The only organism that was not quite as S to penicillins was the Viridans organism that did nto come from deep culture and which we nonetheless be something that we are confident we can overcome with a higher dose of augmentin which for her will be 875/125 mg BID (it is high dose given her GFR)  We would plan on her leaving the hospital with a supply of augmentin to get her through 42 days of operative antibiotics.  I am arranging follow-up in my clinic.  I have personally spent 50 minutes involved in face-to-face and non-face-to-face activities for this patient on  the day of the visit. Professional time spent includes the following activities: Preparing to see the patient (review of tests), Obtaining and/or reviewing separately obtained history (admission/discharge record), Performing a medically appropriate examination and/or evaluation , Ordering medications/tests/procedures, referring and communicating with other health care professionals, Documenting clinical information in the EMR, Independently interpreting results (not separately reported), Communicating results to the patient/family/caregiver, Counseling and educating the patient/family/caregiver and Care coordination (not separately reported).    Larinda Herter has an appointment on 03/17/2023 at 315PM with Dr. Daiva Eves  at  [facility] for Infectious Disease, which  is located in the [facility] at  [address] [place] in [place].  [address], which is located to the left of the elevators.  Phone: [phone]  Fax: [phone]  [email or web address]  The patient should arrive 30 minutes prior to their appoitment.  I will sign off for now please call with further questions.    LOS: 5  days   Acey Lav 02/24/2023, 1:52 PM

## 2023-02-24 NOTE — Progress Notes (Signed)
Mobility Specialist Progress Note;   02/24/23 1500  Mobility  Activity Ambulated with assistance in hallway  Level of Assistance Contact guard assist, steadying assist  Assistive Device Four wheel walker  Distance Ambulated (ft) 75 ft  RUE Weight Bearing Weight bear through elbow only  Activity Response Tolerated well  Mobility Referral Yes  $Mobility charge 1 Mobility  Mobility Specialist Start Time (ACUTE ONLY) 1500  Mobility Specialist Stop Time (ACUTE ONLY) 1525  Mobility Specialist Time Calculation (min) (ACUTE ONLY) 25 min   Pt eager for mobility. Received on [facility] upon arrival (tech in room), void successful. Required minG assistance during ambulation. No c/o during session and feeling much better than she was this AM. Pt back in bed with all needs met. Husband in room.  Caesar Bookman Mobility Specialist Please contact via SecureChat or Rehab Office [phone]

## 2023-02-24 NOTE — Plan of Care (Signed)
Patient states she feel better today and surgeon stated her surgical wound on her right hand is healing.

## 2023-02-24 NOTE — Progress Notes (Signed)
Patient felt hot and sweaty and wanted to use the bathroom this am during bedside report. Patient sat up and keep saying she just didn't feel right. FSBS checked and was 54. Blood pressure in the 160's systolic and patient knew where she was. MD made aware cup of orange juice and 1/2 amp of d50 per MD.

## 2023-02-24 NOTE — Progress Notes (Signed)
Subjective: 4 Days Post-Op Procedure(s) (LRB): IRRIGATION AND DEBRIDEMENT vs. AMPUTATION RIGHT LONG FINGER (Right)  Seen by ID who rec 42 days augmentin based on culture sensitivities.  Cultures positive for Proteus mirabilis, pasturella, strep anginosus, bacteroides fragilis and prevotella.  Objective: Vital signs in last 24 hours: Temp:  [97.6 F (36.4 C)-98.2 F (36.8 C)] 98.2 F (36.8 C) (10/15 1613) Pulse Rate:  [80-98] 98 (10/15 2054) Resp:  [14-18] 17 (10/15 2054) BP: (114-176)/(64-89) 176/87 (10/15 2054) SpO2:  [94 %-98 %] 95 % (10/15 2054) Weight:  [84.1 kg] 84.1 kg (10/15 0500)  Intake/Output from previous day: 10/14 0701 - 10/15 0700 In: 116.4 [IV Piggyback:116.4] Out: -  Intake/Output this shift: Total I/O In: 276.8 [IV Piggyback:276.8] Out: -   Recent Labs    02/22/23 0739 02/23/23 0443 02/24/23 0315  HGB 8.4* 7.8* 7.8*   Recent Labs    02/23/23 0443 02/24/23 0315  WBC 14.7* 12.6*  RBC 2.51* 2.52*  HCT 23.6* 23.8*  PLT 351 376   Recent Labs    02/23/23 0443 02/24/23 0315  NA 141 143  K 4.6 4.5  CL 110 111  CO2 21* 22  BUN 88* 95*  CREATININE 3.93* 3.38*  GLUCOSE 199* 77  CALCIUM 8.6* 8.7*   No results for input(s): "LABPT", "INR" in the last 72 hours.  Exam: Central palm serous drainage present with macerated skin   Assessment/Plan: 4 Days Post-Op Procedure(s) (LRB): IRRIGATION AND DEBRIDEMENT vs. AMPUTATION RIGHT LONG FINGER (Right)  Change dressing any time it is found to be saturated.  Please teach TID antibacterial soap soaks for discharge.  Follow up in 1 week with me in clinic.   Antibacterial soap soaks  Supplies needed: -Liquid antibacterial soap: Hibiclens or Dial antibacterial soap -clean basin large enough to submerge wound (to be rinsed and dried between uses) -clean dry towel -Sterile gauze -comfortable tape or self adhesive wrap (Coban or ACE)  Perform 3 times per day: Place the antibacterial liquid soap in the  basin and add warm water and mix thoroughly.  (The water should be sudsy) Soak wound in warm soapy water for 15 minutes in clean basin Gently pat dry with clean towel Place gauze over wound Wrap with ACE wrap   Angela Castro 02/24/2023, 10:40 PM

## 2023-02-24 NOTE — Progress Notes (Signed)
Patient ID: Angela Castro, female   DOB: 1941-02-02, 82 y.o.   MRN: [id number]   LOS: 5 days   Subjective: Doing well from hand standpoint   Objective: Vital signs in last 24 hours: Temp:  [97.3 F (36.3 C)-97.9 F (36.6 C)] 97.9 F (36.6 C) (10/15 0425) Pulse Rate:  [80-91] 80 (10/15 0800) Resp:  [9-18] 14 (10/15 0800) BP: (153-163)/(64-88) 163/75 (10/15 0800) SpO2:  [91 %-97 %] 95 % (10/15 0800) Weight:  [84.1 kg] 84.1 kg (10/15 0500) Last BM Date : 02/24/23   Laboratory  CBC Recent Labs    02/23/23 0443 02/24/23 0315  WBC 14.7* 12.6*  HGB 7.8* 7.8*  HCT 23.6* 23.8*  PLT 351 376   BMET Recent Labs    02/23/23 0443 02/24/23 0315  NA 141 143  K 4.6 4.5  CL 110 111  CO2 21* 22  GLUCOSE 199* 77  BUN 88* 95*  CREATININE 3.93* 3.38*  CALCIUM 8.6* 8.7*     Physical Exam General appearance: alert and no distress Right hand -- Incisions C/D/I, sensation essentially absent chronically, radial 1+   Assessment/Plan: S/p I&D, amputation right hand -- Dr. Kerry Fort to evaluate personally this evening.    Freeman Caldron, PA-C Orthopedic Surgery [phone] 02/24/2023

## 2023-02-24 NOTE — Progress Notes (Signed)
Mobility Specialist Progress Note;   02/24/23 1005  Mobility  Activity Stood at bedside  Level of Assistance Minimal assist, patient does 75% or more  Assistive Device Four wheel walker  RUE Weight Bearing Weight bear through elbow only  Activity Response Tolerated fair  Mobility Referral Yes  $Mobility charge 1 Mobility  Mobility Specialist Start Time (ACUTE ONLY) 1005  Mobility Specialist Stop Time (ACUTE ONLY) 1025  Mobility Specialist Time Calculation (min) (ACUTE ONLY) 20 min    Pre-mobility: BP sitting 155/108 During-mobility: BP standing 140/81  Pt agreeable to mobility w/ encouragement. C/o lightheadedness, stated her glucose reading was 54mg /dL this AM. Upon standing, pt c/o feeling weak overall. Further mobility deferred, will f/u as schedule permits.    Caesar Bookman Mobility Specialist Please contact via SecureChat or Rehab Office [phone]

## 2023-02-24 NOTE — Plan of Care (Signed)
  Problem: Education: Goal: Knowledge of General Education information will improve Description: Including pain rating scale, medication(s)/side effects and non-pharmacologic comfort measures Outcome: Progressing   Problem: Health Behavior/Discharge Planning: Goal: Ability to manage health-related needs will improve Outcome: Progressing   Problem: Clinical Measurements: Goal: Ability to maintain clinical measurements within normal limits will improve Outcome: Progressing Goal: Will remain free from infection Outcome: Progressing Goal: Diagnostic test results will improve Outcome: Progressing Goal: Respiratory complications will improve Outcome: Progressing Goal: Cardiovascular complication will be avoided Outcome: Progressing   Problem: Activity: Goal: Risk for activity intolerance will decrease Outcome: Progressing   Problem: Nutrition: Goal: Adequate nutrition will be maintained Outcome: Progressing   Problem: Coping: Goal: Level of anxiety will decrease Outcome: Progressing   Problem: Elimination: Goal: Will not experience complications related to bowel motility Outcome: Progressing Goal: Will not experience complications related to urinary retention Outcome: Progressing   Problem: Pain Managment: Goal: General experience of comfort will improve Outcome: Progressing   Problem: Safety: Goal: Ability to remain free from injury will improve Outcome: Progressing   Problem: Skin Integrity: Goal: Risk for impaired skin integrity will decrease Outcome: Progressing   Problem: Education: Goal: Ability to describe self-care measures that may prevent or decrease complications (Diabetes Survival Skills Education) will improve Outcome: Progressing Goal: Individualized Educational Video(s) Outcome: Progressing   Problem: Coping: Goal: Ability to adjust to condition or change in health will improve Outcome: Progressing   Problem: Fluid Volume: Goal: Ability to  maintain a balanced intake and output will improve Outcome: Progressing   Problem: Health Behavior/Discharge Planning: Goal: Ability to identify and utilize available resources and services will improve Outcome: Progressing Goal: Ability to manage health-related needs will improve Outcome: Progressing   Problem: Metabolic: Goal: Ability to maintain appropriate glucose levels will improve Outcome: Progressing   Problem: Nutritional: Goal: Maintenance of adequate nutrition will improve Outcome: Progressing Goal: Progress toward achieving an optimal weight will improve Outcome: Progressing   Problem: Skin Integrity: Goal: Risk for impaired skin integrity will decrease Outcome: Progressing   Problem: Tissue Perfusion: Goal: Adequacy of tissue perfusion will improve Outcome: Progressing   Problem: Fluid Volume: Goal: Hemodynamic stability will improve Outcome: Progressing   Problem: Clinical Measurements: Goal: Diagnostic test results will improve Outcome: Progressing Goal: Signs and symptoms of infection will decrease Outcome: Progressing   Problem: Respiratory: Goal: Ability to maintain adequate ventilation will improve Outcome: Progressing   Problem: Metabolic: Goal: Ability to maintain appropriate glucose levels will improve Outcome: Progressing

## 2023-02-24 NOTE — Progress Notes (Signed)
IVT consult placed for new PIV. Patient's previously USGPIV infiltrated in left lower arm. Right arm is restricted due to surgical procedure and current infection. USGPIV placed in left cephalic, however if IV antibiotics are going to continue to be used, please consider ordering PICC or central line due to limited venous access.   Pierina Schuknecht Loyola Mast, RN

## 2023-02-24 NOTE — TOC Progression Note (Signed)
Transition of Care [facility]) - Progression Note    Patient Details  Name: Angela Castro MRN: [id number] Date of Birth: 13-Oct-1940  [facility] CM/SW Contact  Gordy Clement, RN Phone Number: 02/24/2023, 11:40 AM  Clinical Narrative:     CM met with Patient and family bedside (Husband and Daughter) to discuss dc plan. Patient previously receiving HH from [place] and will continue- Family interested in the following support services- SN PT OT Aide . Order placed. Patient already owns a RW / [facility] / transport chair and a rollator. SN will provide any wound care ordered as well as DM education- assistance with medication. Husband particularly mentioned needing assistance with Lantus injections- states he seems to bend the needle when administering. Family requested PCS information- CM will give list to Husband.   CM will follow for additional needs           Expected Discharge Plan and Services                                               Social Determinants of Health (SDOH) Interventions SDOH Screenings   Food Insecurity: No Food Insecurity (02/19/2023)  Housing: Low Risk  (02/19/2023)  Transportation Needs: No Transportation Needs (02/19/2023)  Utilities: Not At Risk (02/19/2023)  Tobacco Use: Low Risk  (02/20/2023)    Readmission Risk Interventions     No data to display

## 2023-02-25 ENCOUNTER — Other Ambulatory Visit (HOSPITAL_COMMUNITY): Payer: Self-pay

## 2023-02-25 DIAGNOSIS — N183 Chronic kidney disease, stage 3 unspecified: Secondary | ICD-10-CM | POA: Diagnosis not present

## 2023-02-25 DIAGNOSIS — I5032 Chronic diastolic (congestive) heart failure: Secondary | ICD-10-CM | POA: Diagnosis not present

## 2023-02-25 DIAGNOSIS — M869 Osteomyelitis, unspecified: Secondary | ICD-10-CM

## 2023-02-25 LAB — TYPE AND SCREEN
ABO/RH(D): O POS
Antibody Screen: NEGATIVE

## 2023-02-25 LAB — GLUCOSE, CAPILLARY
Glucose-Capillary: 124 mg/dL — ABNORMAL HIGH (ref 70–99)
Glucose-Capillary: 159 mg/dL — ABNORMAL HIGH (ref 70–99)

## 2023-02-25 LAB — CBC WITH DIFFERENTIAL/PLATELET
Abs Immature Granulocytes: 0.22 10*3/uL — ABNORMAL HIGH (ref 0.00–0.07)
Basophils Absolute: 0.1 10*3/uL (ref 0.0–0.1)
Basophils Relative: 0 %
Eosinophils Absolute: 1.3 10*3/uL — ABNORMAL HIGH (ref 0.0–0.5)
Eosinophils Relative: 12 %
HCT: 24.3 % — ABNORMAL LOW (ref 36.0–46.0)
Hemoglobin: 7.8 g/dL — ABNORMAL LOW (ref 12.0–15.0)
Immature Granulocytes: 2 %
Lymphocytes Relative: 14 %
Lymphs Abs: 1.5 10*3/uL (ref 0.7–4.0)
MCH: 29.9 pg (ref 26.0–34.0)
MCHC: 32.1 g/dL (ref 30.0–36.0)
MCV: 93.1 fL (ref 80.0–100.0)
Monocytes Absolute: 0.8 10*3/uL (ref 0.1–1.0)
Monocytes Relative: 7 %
Neutro Abs: 7.3 10*3/uL (ref 1.7–7.7)
Neutrophils Relative %: 65 %
Platelets: 380 10*3/uL (ref 150–400)
RBC: 2.61 MIL/uL — ABNORMAL LOW (ref 3.87–5.11)
RDW: 14 % (ref 11.5–15.5)
WBC: 11.2 10*3/uL — ABNORMAL HIGH (ref 4.0–10.5)
nRBC: 0 % (ref 0.0–0.2)

## 2023-02-25 LAB — CULTURE, BLOOD (ROUTINE X 2)
Culture: NO GROWTH
Culture: NO GROWTH
Special Requests: ADEQUATE
Special Requests: ADEQUATE

## 2023-02-25 LAB — BASIC METABOLIC PANEL
Anion gap: 9 (ref 5–15)
BUN: 83 mg/dL — ABNORMAL HIGH (ref 8–23)
CO2: 25 mmol/L (ref 22–32)
Calcium: 8.9 mg/dL (ref 8.9–10.3)
Chloride: 109 mmol/L (ref 98–111)
Creatinine, Ser: 3.24 mg/dL — ABNORMAL HIGH (ref 0.44–1.00)
GFR, Estimated: 14 mL/min — ABNORMAL LOW (ref >60)
Glucose, Bld: 140 mg/dL — ABNORMAL HIGH (ref 70–99)
Potassium: 4.2 mmol/L (ref 3.5–5.1)
Sodium: 143 mmol/L (ref 135–145)

## 2023-02-25 LAB — AEROBIC CULTURE W GRAM STAIN (SUPERFICIAL SPECIMEN): Gram Stain: NONE SEEN

## 2023-02-25 LAB — PROCALCITONIN: Procalcitonin: 0.32 ng/mL

## 2023-02-25 MED ORDER — AMLODIPINE BESYLATE 5 MG PO TABS
5.0000 mg | ORAL_TABLET | Freq: Every day | ORAL | Status: DC
  Administered 2023-02-25: 5 mg via ORAL
  Filled 2023-02-25: qty 1

## 2023-02-25 MED ORDER — AMOXICILLIN-POT CLAVULANATE 875-125 MG PO TABS
1.0000 | ORAL_TABLET | Freq: Two times a day (BID) | ORAL | 0 refills | Status: AC
Start: 2023-02-25 — End: 2023-04-05
  Filled 2023-02-25: qty 78, 39d supply, fill #0

## 2023-02-25 MED ORDER — HYDROCODONE-ACETAMINOPHEN 5-325 MG PO TABS
1.0000 | ORAL_TABLET | Freq: Four times a day (QID) | ORAL | 0 refills | Status: DC | PRN
  Filled 2023-02-25: qty 15, 4d supply, fill #0

## 2023-02-25 MED ORDER — TORSEMIDE 20 MG PO TABS
40.0000 mg | ORAL_TABLET | Freq: Every day | ORAL | 0 refills | Status: AC
  Filled 2023-02-25: qty 60, 30d supply, fill #0

## 2023-02-25 MED ORDER — GABAPENTIN 100 MG PO CAPS
100.0000 mg | ORAL_CAPSULE | Freq: Two times a day (BID) | ORAL | 0 refills | Status: DC
  Filled 2023-02-25: qty 60, 30d supply, fill #0

## 2023-02-25 MED ORDER — ORAL CARE MOUTH RINSE: 15.0000 mL | OROMUCOSAL | Status: DC | PRN

## 2023-02-25 NOTE — Progress Notes (Signed)
Occupational Therapy Treatment Patient Details Name: Angela Castro MRN: [id number] DOB: [birth year] Today's Date: 02/25/2023   History of present illness 82 y.o. female Presented 02/19/23 with  right hand 3rd finger swelling and redness; 10/11 Rt long finger partial amputation with I&D of flexor tendon sheath and open carpal tunnel relaease  PMH significant of DM2, diastolic CHF,  CKD 3, IDA, gout, neuropathy   OT comments  Husband present for session. Education regarding use of AE and compensatory strategies to increase independence with ADL and reduce burden of care. Nsg in toward end of session to educate on dressing changes, which pt and husband were asking about. Continue to recommend HHOT.      If plan is discharge home, recommend the following:  A lot of help with bathing/dressing/bathroom;Assistance with cooking/housework;Assist for transportation;Help with stairs or ramp for entrance   Equipment Recommendations  None recommended by OT    Recommendations for Other Services      Precautions / Restrictions Precautions Precautions: Fall Restrictions RUE Weight Bearing: Weight bear through elbow only Other Position/Activity Restrictions: Can WB through R elbow       Mobility Bed Mobility                    Transfers                         Balance                                           ADL either performed or assessed with clinical judgement   ADL     Eating/Feeding Details (indicate cue type and reason): Pt states her husband/others have been feeding her. Pt has a functional L hand. Issued red built up tubing and pt able to use utensil wihtout difficulty; set up will be needed. Husband educated                         Toileting- IT trainer:  (recommend use of flushable wipes; pt staes she fees she can do this with wipes)         General ADL Comments: Takes sponge baths at baseline; Husband  expressing concerns about how to bath his wife. Pt will be doing soaks for R hand as ordered by MD. Pt able to use L hand for bahting. Compensatory strategies were discussed. Pt will likely benefit from using a long handled sponge with built up handle to assist with LB bathing. Husband/pt encouraged to discuss concenrs and obstacles with HHOT. Extra tubing given to pt    Extremity/Trunk Assessment Upper Extremity Assessment RUE Deficits / Details: Ace wrapped, no WB allowed LUE Deficits / Details: able to make full fist, missing tips; states she has difficulty "squeezing things" LUE Coordination: decreased fine motor            Vision       Perception     Praxis      Cognition                                                Exercises Exercises: Other exercises Other Exercises Other Exercises: sqeeze foan(blue) adn yellow theraputty exercises given to  work on L Insurance claims handler Comments      Pertinent Vitals/ Pain       Pain Assessment Pain Assessment: Faces Pain Score: 2  Pain Location: R hand Pain Descriptors / Indicators: Discomfort Pain Intervention(s): Limited activity within patient's tolerance  Home Living                                          Prior Functioning/Environment              Frequency  Min 1X/week        Progress Toward Goals  OT Goals(current goals can now be found in the care plan section)  Progress towards OT goals: Progressing toward goals  Acute Rehab OT Goals Patient Stated Goal: home today OT Goal Formulation: With patient/family Time For Goal Achievement: 03/08/23 Potential to Achieve Goals: Good ADL Goals Pt Will Perform Eating: with set-up;with adaptive utensils Pt Will Perform Grooming: with set-up;with supervision;with adaptive equipment Pt Will Transfer to Toilet: with modified independence;ambulating  Plan      Co-evaluation                  AM-PAC OT "6 Clicks" Daily Activity     Outcome Measure   Help from another person eating meals?: A Little Help from another person taking care of personal grooming?: A Little Help from another person toileting, which includes using toliet, bedpan, or urinal?: A Lot Help from another person bathing (including washing, rinsing, drying)?: A Lot Help from another person to put on and taking off regular upper body clothing?: A Lot Help from another person to put on and taking off regular lower body clothing?: Total 6 Click Score: 13    End of Session    OT Visit Diagnosis: Unsteadiness on feet (R26.81);Other abnormalities of gait and mobility (R26.89);Muscle weakness (generalized) (M62.81)   Activity Tolerance Patient tolerated treatment well   Patient Left Other (comment);with nursing/sitter in room (sitting on [facility])   Nurse Communication Other (comment) (DC needs)        Time: 1033-1050 OT Time Calculation (min): 17 min  Charges: OT General Charges $OT Visit: 1 Visit OT Treatments $Self Care/Home Management : 8-22 mins  Luisa Dago, OT/L   Acute OT Clinical Specialist Acute Rehabilitation Services Pager [phone] Office [phone]   [facility] 02/25/2023, 11:15 AM

## 2023-02-25 NOTE — Progress Notes (Signed)
Nephrology Follow-Up Consult note   Assessment/Recommendations: Angela Castro is a/an 82 y.o. female with a past medical history significant for DM2, diastolic CHF, CKD 4, gout, admitted for hand infection and AKI.       Non-Oliguric AKI on CKD 4: Likely secondary to volume depletion and likely liver injury in the setting of sepsis -Creatinine slowly improving -Continue holding diuretics ARB -Continue to monitor daily Cr, Dose meds for GFR -Monitor Daily I/Os, Daily weight  -Maintain MAP>65 for optimal renal perfusion.  -Avoid nephrotoxic medications including NSAIDs -Use synthetic opioids (Fentanyl/Dilaudid) if needed -Patient has no interest in dialysis in the long-term -we will set up outpatient follow-up and labs.  Follow-up in 2 weeks.  Labs on Monday  Diastolic heart failure: Chronic lower extremity edema.  Worsening today.  On Bumex 2 mg daily outpatient.  Started torsemide 40 mg daily.  Continue until follow-up.  Hypertension: Blood pressure on the lower side.  Continue holding home blood pressure medications  Cellulitis with abscess of the right third digit and possible osteomyelitis: Status post partial amputation and I&D.  On oral antibiotics now  Anemia: iron deficiency.  Inflammation also contributing.  Consider transfusion per primary team.  Infection now significantly improved on oral antibiotics.  Iron levels fairly low.  Will do IV iron given risk of worsening infection seems low.  Could consider ESA  Uncontrolled Diabetes Mellitus Type 2 with Hyperglycemia: Management per primary team   Given the patient's improvement we will sign off at this time.   Recommendations conveyed to primary service.    Darnell Level [facility] 02/25/2023 10:57 AM  ___________________________________________________________  CC: Hand infection  Interval History/Subjective: Patient feels well today wants to go home.  No other complaints.  Creatinine improved  to 3.2   Medications:  Current Facility-Administered Medications  Medication Dose Route Frequency Provider Last Rate Last Admin   acetaminophen (TYLENOL) tablet 650 mg  650 mg Oral Q6H PRN Doutova, Anastassia, MD   650 mg at 02/23/23 1827   Or   acetaminophen (TYLENOL) suppository 650 mg  650 mg Rectal Q6H PRN Doutova, Anastassia, MD       allopurinol (ZYLOPRIM) tablet 100 mg  100 mg Oral BID Doutova, Anastassia, MD   100 mg at 02/25/23 0915   amLODipine (NORVASC) tablet 5 mg  5 mg Oral Daily Elgergawy, Leana Roe, MD   5 mg at 02/25/23 0608   amoxicillin-clavulanate (AUGMENTIN) 875-125 MG per tablet 1 tablet  1 tablet Oral Q12H Daiva Eves, Lisette Grinder, MD   1 tablet at 02/25/23 0915   benzonatate (TESSALON) capsule 100 mg  100 mg Oral TID PRN Leroy Sea, MD   100 mg at 02/24/23 1722   bisoprolol (ZEBETA) tablet 10 mg  10 mg Oral Daily Doutova, Jonny Ruiz, MD   10 mg at 02/25/23 0918   ferric gluconate (FERRLECIT) 250 mg in sodium chloride 0.9 % 250 mL IVPB  250 mg Intravenous Daily Darnell Level, MD 135 mL/hr at 02/25/23 1043 250 mg at 02/25/23 1043   gabapentin (NEURONTIN) capsule 100 mg  100 mg Oral BID Leroy Sea, MD   100 mg at 02/25/23 0915   guaiFENesin-dextromethorphan (ROBITUSSIN DM) 100-10 MG/5ML syrup 10 mL  10 mL Oral Q4H PRN Leroy Sea, MD       HYDROcodone-acetaminophen (NORCO/VICODIN) 5-325 MG per tablet 1 tablet  1 tablet Oral Q4H PRN Leroy Sea, MD   1 tablet at 02/23/23 0047   insulin aspart (novoLOG) injection 0-5  Units  0-5 Units Subcutaneous QHS Leroy Sea, MD   2 Units at 02/24/23 2100   insulin aspart (novoLOG) injection 0-9 Units  0-9 Units Subcutaneous TID WC Leroy Sea, MD   1 Units at 02/25/23 0925   insulin glargine-yfgn (SEMGLEE) injection 40 Units  40 Units Subcutaneous Daily Leroy Sea, MD       loperamide (IMODIUM) capsule 2 mg  2 mg Oral Q6H PRN Leroy Sea, MD   2 mg at 02/24/23 1441   multivitamin with  minerals tablet 1 tablet  1 tablet Oral Daily Leroy Sea, MD   1 tablet at 02/25/23 0915   naloxone (NARCAN) injection 0.4 mg  0.4 mg Intravenous PRN Leroy Sea, MD       nutrition supplement (JUVEN) (JUVEN) powder packet 1 packet  1 packet Oral BID BM Leroy Sea, MD   1 packet at 02/24/23 0927   ondansetron (ZOFRAN) tablet 4 mg  4 mg Oral Q6H PRN Therisa Doyne, MD       Or   ondansetron (ZOFRAN) injection 4 mg  4 mg Intravenous Q6H PRN Doutova, Anastassia, MD       Oral care mouth rinse  15 mL Mouth Rinse PRN Leroy Sea, MD       simvastatin (ZOCOR) tablet 5 mg  5 mg Oral QHS Doutova, Anastassia, MD   5 mg at 02/24/23 2100   torsemide (DEMADEX) tablet 40 mg  40 mg Oral Daily Darnell Level, MD   40 mg at 02/25/23 0915      Review of Systems: 10 systems reviewed and negative except per interval history/subjective  Physical Exam: Vitals:   02/25/23 0434 02/25/23 0805  BP: (!) 166/73 (!) 163/66  Pulse: 85 82  Resp: 16 18  Temp: 98.2 F (36.8 C) 98.1 F (36.7 C)  SpO2: 97% 97%   No intake/output data recorded.  Intake/Output Summary (Last 24 hours) at 02/25/2023 1057 Last data filed at 02/24/2023 2207 Gross per 24 hour  Intake 1256.75 ml  Output --  Net 1256.75 ml   Constitutional: well-appearing, no acute distress ENMT: ears and nose without scars or lesions, MMM CV: normal rate, 1+ edema in the bilateral lower extremities Respiratory: Bilateral chest rise, normal work of breathing Gastrointestinal: soft, non-tender, no palpable masses or hernias Skin: Right hand wrapped in gauze, otherwise no visible lesions or rashes Psych: alert, judgement/insight appropriate, appropriate mood and affect   Test Results I personally reviewed new and old clinical labs and radiology tests Lab Results  Component Value Date   NA 143 02/25/2023   K 4.2 02/25/2023   CL 109 02/25/2023   CO2 25 02/25/2023   BUN 83 (H) 02/25/2023   CREATININE 3.24 (H)  02/25/2023   CALCIUM 8.9 02/25/2023   ALBUMIN 1.9 (L) 02/20/2023   PHOS 4.3 02/20/2023    CBC Recent Labs  Lab 02/23/23 0443 02/24/23 0315 02/25/23 0313  WBC 14.7* 12.6* 11.2*  NEUTROABS 12.7* 8.6* 7.3  HGB 7.8* 7.8* 7.8*  HCT 23.6* 23.8* 24.3*  MCV 94.0 94.4 93.1  PLT [phone]

## 2023-02-25 NOTE — TOC Transition Note (Signed)
Transition of Care [facility]) - CM/SW Discharge Note   Patient Details  Name: Angela Castro MRN: [id number] Date of Birth: 1940-06-10  Transition of [facility]) CM/SW Contact:  Gordy Clement, RN Phone Number: 02/25/2023, 11:06 AM   Clinical Narrative:    Patient to dc to home today.. Home Health PT/OT/SN and Aide will be provided by  [facility].  No DME is needed. Resources for [facility] were given yesterday. Husband to transport home. No additional TOC needs             Patient Goals and CMS Choice      Discharge Placement                         Discharge Plan and Services Additional resources added to the After Visit Summary for                                       Social Determinants of Health (SDOH) Interventions SDOH Screenings   Food Insecurity: No Food Insecurity (02/19/2023)  Housing: Low Risk  (02/19/2023)  Transportation Needs: No Transportation Needs (02/19/2023)  Utilities: Not At Risk (02/19/2023)  Tobacco Use: Low Risk  (02/20/2023)     Readmission Risk Interventions     No data to display

## 2023-02-25 NOTE — Discharge Instructions (Signed)
Follow with Primary MD Farris Has, MD in 7 days   Get CBC, CMP,  checked  by Primary MD next visit.    Activity: As tolerated with Full fall precautions use walker/cane & assistance as needed   Disposition Home    Diet: Heart Healthy /Carb modified   On your next visit with your primary care physician please Get Medicines reviewed and adjusted.   Please request your Prim.MD to go over all Hospital Tests and Procedure/Radiological results at the follow up, please get all Hospital records sent to your Prim MD by signing hospital release before you go home.   If you experience worsening of your admission symptoms, develop shortness of breath, life threatening emergency, suicidal or homicidal thoughts you must seek medical attention immediately by calling 911 or calling your MD immediately  if symptoms less severe.  You Must read complete instructions/literature along with all the possible adverse reactions/side effects for all the Medicines you take and that have been prescribed to you. Take any new Medicines after you have completely understood and accpet all the possible adverse reactions/side effects.   Do not drive, operating heavy machinery, perform activities at heights, swimming or participation in water activities or provide baby sitting services if your were admitted for syncope or siezures until you have seen by Primary MD or a Neurologist and advised to do so again.  Do not drive when taking Pain medications.    Do not take more than prescribed Pain, Sleep and Anxiety Medications  Special Instructions: If you have smoked or chewed Tobacco  in the last 2 yrs please stop smoking, stop any regular Alcohol  and or any Recreational drug use.  Wear Seat belts while driving.   Please note  You were cared for by a hospitalist during your hospital stay. If you have any questions about your discharge medications or the care you received while you were in the hospital after you  are discharged, you can call the unit and asked to speak with the hospitalist on call if the hospitalist that took care of you is not available. Once you are discharged, your primary care physician will handle any further medical issues. Please note that NO REFILLS for any discharge medications will be authorized once you are discharged, as it is imperative that you return to your primary care physician (or establish a relationship with a primary care physician if you do not have one) for your aftercare needs so that they can reassess your need for medications and monitor your lab values.

## 2023-02-25 NOTE — Progress Notes (Signed)
Angela Castro is alert and oriented x4. No complaints of pain. This nurse completed wound care to right hand while Angela Castro (husband) was in room. This nurse educated both parties. Angela Castro was able to tolerate looking at wound with no issues. Both parties were able to teach back care instructions of to this nurse adequately. Wound care instructions were reiterated at discharge teaching as well. All questions and concerns were addressed by this nurse .

## 2023-02-25 NOTE — Plan of Care (Signed)
  Problem: Education: Goal: Knowledge of General Education information will improve Description: Including pain rating scale, medication(s)/side effects and non-pharmacologic comfort measures Outcome: Progressing   Problem: Health Behavior/Discharge Planning: Goal: Ability to manage health-related needs will improve Outcome: Progressing   Problem: Clinical Measurements: Goal: Ability to maintain clinical measurements within normal limits will improve Outcome: Progressing Goal: Will remain free from infection Outcome: Progressing Goal: Diagnostic test results will improve Outcome: Progressing Goal: Respiratory complications will improve Outcome: Progressing Goal: Cardiovascular complication will be avoided Outcome: Progressing   Problem: Activity: Goal: Risk for activity intolerance will decrease Outcome: Progressing   Problem: Nutrition: Goal: Adequate nutrition will be maintained Outcome: Progressing   Problem: Coping: Goal: Level of anxiety will decrease Outcome: Progressing   Problem: Elimination: Goal: Will not experience complications related to bowel motility Outcome: Progressing Goal: Will not experience complications related to urinary retention Outcome: Progressing   Problem: Pain Managment: Goal: General experience of comfort will improve Outcome: Progressing   Problem: Safety: Goal: Ability to remain free from injury will improve Outcome: Progressing   Problem: Skin Integrity: Goal: Risk for impaired skin integrity will decrease Outcome: Progressing   Problem: Education: Goal: Ability to describe self-care measures that may prevent or decrease complications (Diabetes Survival Skills Education) will improve Outcome: Progressing Goal: Individualized Educational Video(s) Outcome: Progressing   Problem: Coping: Goal: Ability to adjust to condition or change in health will improve Outcome: Progressing   Problem: Fluid Volume: Goal: Ability to  maintain a balanced intake and output will improve Outcome: Progressing   Problem: Health Behavior/Discharge Planning: Goal: Ability to identify and utilize available resources and services will improve Outcome: Progressing Goal: Ability to manage health-related needs will improve Outcome: Progressing   Problem: Metabolic: Goal: Ability to maintain appropriate glucose levels will improve Outcome: Progressing   Problem: Nutritional: Goal: Maintenance of adequate nutrition will improve Outcome: Progressing Goal: Progress toward achieving an optimal weight will improve Outcome: Progressing   Problem: Skin Integrity: Goal: Risk for impaired skin integrity will decrease Outcome: Progressing   Problem: Tissue Perfusion: Goal: Adequacy of tissue perfusion will improve Outcome: Progressing   Problem: Fluid Volume: Goal: Hemodynamic stability will improve Outcome: Progressing   Problem: Clinical Measurements: Goal: Diagnostic test results will improve Outcome: Progressing Goal: Signs and symptoms of infection will decrease Outcome: Progressing   Problem: Respiratory: Goal: Ability to maintain adequate ventilation will improve Outcome: Progressing   Problem: Metabolic: Goal: Ability to maintain appropriate glucose levels will improve Outcome: Progressing

## 2023-02-25 NOTE — Progress Notes (Signed)
   02/25/23 1010  Mobility  Activity Ambulated with assistance in hallway  Level of Assistance Contact guard assist, steadying assist  Assistive Device Four wheel walker  Distance Ambulated (ft) 75 ft  RUE Weight Bearing Weight bear through elbow only  Activity Response Tolerated well  Mobility Referral Yes  $Mobility charge 1 Mobility  Mobility Specialist Start Time (ACUTE ONLY) 1010  Mobility Specialist Stop Time (ACUTE ONLY) 1030  Mobility Specialist Time Calculation (min) (ACUTE ONLY) 20 min   Pt eager for mobility. Required minG assistance during ambulation for safety. Asx throughout session. Eager to get back home safely. Pt back in chair with all needs met.   Caesar Bookman Mobility Specialist Please contact via SecureChat or Rehab Office [phone]

## 2023-02-25 NOTE — Discharge Summary (Addendum)
soap: Hibiclens or Dial antibacterial soap -clean basin large enough to submerge wound (to be rinsed and dried between uses) -clean dry towel -Sterile gauze -comfortable tape or self adhesive wrap (Coban or ACE)   Perform 3 times per day: 1. Place the antibacterial liquid soap in the basin and add warm water and mix thoroughly.  (The water should be sudsy) 2. Soak wound in warm soapy water for 15 minutes in clean basin 3. Gently pat dry with clean towel 4. Place gauze over wound 5. Wrap with ACE wrap   02/25/23 5956            Follow-up Information     Chiaramonti, Edsel Petrin, MD Follow up in 1 week(s).   Specialties: Orthopedic Surgery, Hand Surgery Contact information: [address] [place] [address] [phone]                Allergies  Allergen Reactions   Sulfa Antibiotics Shortness Of Breath and Rash   Adhesive [Tape] Other (See Comments)    Causes redness, please use paper tape   Keflex [Cephalexin] Other (See Comments)    Possibly caused lethargy (taken with Flagyl) Tolerated cefepime 02/20/23   Lisinopril Cough   Metronidazole Other (See Comments)    Possibly caused lethargy (taken with keflex)   Teflaro [Ceftaroline] Other (See Comments)    Lethargy, possibly rash Tolerated cefepime 02/20/23    Consultations: renal ID Hand surgery   Procedures/Studies: DG Chest Port 1 View  Result Date: 02/22/2023 CLINICAL DATA:  Shortness of breath EXAM: PORTABLE CHEST 1 VIEW  COMPARISON:  12/30/2022 FINDINGS: Heart size is mildly enlarged. There is blunting of the right costophrenic angle. Scar versus atelectasis within the right base. No consolidative change identified. Thoracolumbar scoliosis deformity. IMPRESSION: 1. Blunting of the right costophrenic angle may reflect a small pleural effusion or pleural thickening. 2. Scar versus atelectasis within the right base. Electronically Signed   By: Signa Kell M.D.   On: 02/22/2023 12:48   US RENAL  Result Date: 02/21/2023 CLINICAL DATA:  Acute kidney injury EXAM: RENAL / URINARY TRACT ULTRASOUND COMPLETE COMPARISON:  02/03/2023 FINDINGS: Right Kidney: Renal measurements: 9 x 3.8 x 4.2 cm = volume: 76.09 mL. Increased parenchymal echogenicity. No mass or hydronephrosis visualized. Left Kidney: Suboptimally visualized. None renal measurements: 9.2 x 5.2 x 4.7 cm = volume: 118.7 mL. Echogenicity within normal limits. No mass or hydronephrosis visualized. Bladder: Appears normal for degree of bladder distention. Other: Fluid noted within Morrison's pouch. IMPRESSION: 1. No acute findings. No hydronephrosis. 2. Increased parenchymal echogenicity of the right kidney compatible with medical renal disease. Electronically Signed   By: Signa Kell M.D.   On: 02/21/2023 10:28   VAS [place] UPPER EXTREMITY ARTERIAL DUPLEX  Result Date: 02/20/2023  UPPER EXTREMITY DUPLEX STUDY Patient Name:  Angela Castro  Date of Exam:   02/20/2023 Medical Rec #: [id number]       Accession #:    [id number] Date of Birth: [birth year]       Patient Gender: F Patient Age:   82 years Exam Location:  [facility] Procedure:      VAS [place] UPPER EXTREMITY ARTERIAL DUPLEX Referring Phys: Graceann Congress --------------------------------------------------------------------------------  Indications: Increased swelling, redness and drainage from her right middle              finger. No pain associated with her fingers.  Risk Factors:  Hypertension, hyperlipidemia,  Diabetes. Other Factors: Long history of multiple finger infections and prior auto  Physician Discharge Summary  Angela Castro WUJ:[id number] DOB: Jul 01, 1940 DOA: 02/19/2023  PCP: Farris Has, MD  Admit date: 02/19/2023 Discharge date: 02/25/2023  Admitted From: (Home) Disposition:  (Home)  Recommendations for Outpatient Follow-up:  Follow up with PCP in 1-2 weeks Please obtain BMP/CBC in one week Please continue with wound care as instructed below, patient to follow with hand surgery in 1 week, and to follow-up with ID on scheduled appointment 03/17/2023.   Diet recommendation: Heart Healthy / Carb Modified  Brief/Interim Summary: 82 y.o. female with medical history significant of DM2, diastolic CHF,  CKD 3, IDA, gout, neuropathy. Presented with  right hand 3rd finger swelling and redness.   Presents with a right hand third finger swelling redness was at wound care yesterday and was told to go to emergency department has history of wounds that required an amputation with underlying history of diabetes there is some drainage of a right hand finger, purulent drainage for the past 3 days. She was originally seen at [facility], ER but left without being seen by provider.  She was diagnosed with severe right hand cellulitis and possible abscess and admitted to the hospital.  Sepsis, POA Cellulitis, abscess, right middle finger osteomyelitis  Her infection has been ongoing for the last several days, he has evidence of sepsis upon admission, has been placed on appropriate IV antibiotics, type screen done, MRSA nasal PCR so far is negative.  Hand surgery consulted, MRI noted, patient underwent Right long finger partial amputation, I&D of flexor tendon sheath and deep spaces of palm & Open carpal tunnel release on 02/20/2023 by Ramon Dredge MD, as noted, ID consulted, infection is clinically improving, recommendation to discharge with p.o. Augmentin for total of 40 days, with outpatient appointment has been arranged with ID -continue with wound care at time of  discharge, home health RN to assist with teaching and wound dressing changes.    Chronic diastolic CHF EF 60% on echocardiogram done 2 years ago.   -Compensated, diuresis been changed from Bumex to torsemide per nephrology recommendations.    AKI on CKD 4.  Baseline creatinine around 2.6.  Renal input greatly appreciated, creatinine peaked at 4, improving, medication has been adjusted, will hold losartan at time of discharge.    Essential hypertension.  On Zebeta, blood pressure is elevated this morning, so resumed on home dose Norvasc prior to discharge, so continue Norvasc and Zebeta.  Losartan has been discontinued at time of discharge due to AKI.   DM type II. - hold glipizide at time of discharge due to worsening renal function, continue with home dose Lantus and Januvia.   -Gabapentin dose has been decreased due to worsening renal function.       Discharge Diagnoses:  Principal Problem:   Finger osteomyelitis, right (HCC) Active Problems:   Diabetes mellitus, type II, insulin dependent (HCC)   Hypertension, essential   CKD (chronic kidney disease) stage 3, GFR 30-59 ml/min (HCC)   Chronic heart failure with preserved ejection fraction (HCC)   Hypomagnesemia   Anemia   Suppurative tenosynovitis of flexor tendon of right hand    Discharge Instructions  Discharge Instructions     Diet - low sodium heart healthy   Complete by: As directed    Discharge instructions   Complete by: As directed    Follow with Primary MD Farris Has, MD in 7 days   Get CBC, CMP,  checked  by Primary MD next visit.    Activity: As tolerated with Full  Physician Discharge Summary  Angela Castro WUJ:[id number] DOB: Jul 01, 1940 DOA: 02/19/2023  PCP: Farris Has, MD  Admit date: 02/19/2023 Discharge date: 02/25/2023  Admitted From: (Home) Disposition:  (Home)  Recommendations for Outpatient Follow-up:  Follow up with PCP in 1-2 weeks Please obtain BMP/CBC in one week Please continue with wound care as instructed below, patient to follow with hand surgery in 1 week, and to follow-up with ID on scheduled appointment 03/17/2023.   Diet recommendation: Heart Healthy / Carb Modified  Brief/Interim Summary: 82 y.o. female with medical history significant of DM2, diastolic CHF,  CKD 3, IDA, gout, neuropathy. Presented with  right hand 3rd finger swelling and redness.   Presents with a right hand third finger swelling redness was at wound care yesterday and was told to go to emergency department has history of wounds that required an amputation with underlying history of diabetes there is some drainage of a right hand finger, purulent drainage for the past 3 days. She was originally seen at [facility], ER but left without being seen by provider.  She was diagnosed with severe right hand cellulitis and possible abscess and admitted to the hospital.  Sepsis, POA Cellulitis, abscess, right middle finger osteomyelitis  Her infection has been ongoing for the last several days, he has evidence of sepsis upon admission, has been placed on appropriate IV antibiotics, type screen done, MRSA nasal PCR so far is negative.  Hand surgery consulted, MRI noted, patient underwent Right long finger partial amputation, I&D of flexor tendon sheath and deep spaces of palm & Open carpal tunnel release on 02/20/2023 by Ramon Dredge MD, as noted, ID consulted, infection is clinically improving, recommendation to discharge with p.o. Augmentin for total of 40 days, with outpatient appointment has been arranged with ID -continue with wound care at time of  discharge, home health RN to assist with teaching and wound dressing changes.    Chronic diastolic CHF EF 60% on echocardiogram done 2 years ago.   -Compensated, diuresis been changed from Bumex to torsemide per nephrology recommendations.    AKI on CKD 4.  Baseline creatinine around 2.6.  Renal input greatly appreciated, creatinine peaked at 4, improving, medication has been adjusted, will hold losartan at time of discharge.    Essential hypertension.  On Zebeta, blood pressure is elevated this morning, so resumed on home dose Norvasc prior to discharge, so continue Norvasc and Zebeta.  Losartan has been discontinued at time of discharge due to AKI.   DM type II. - hold glipizide at time of discharge due to worsening renal function, continue with home dose Lantus and Januvia.   -Gabapentin dose has been decreased due to worsening renal function.       Discharge Diagnoses:  Principal Problem:   Finger osteomyelitis, right (HCC) Active Problems:   Diabetes mellitus, type II, insulin dependent (HCC)   Hypertension, essential   CKD (chronic kidney disease) stage 3, GFR 30-59 ml/min (HCC)   Chronic heart failure with preserved ejection fraction (HCC)   Hypomagnesemia   Anemia   Suppurative tenosynovitis of flexor tendon of right hand    Discharge Instructions  Discharge Instructions     Diet - low sodium heart healthy   Complete by: As directed    Discharge instructions   Complete by: As directed    Follow with Primary MD Farris Has, MD in 7 days   Get CBC, CMP,  checked  by Primary MD next visit.    Activity: As tolerated with Full  soap: Hibiclens or Dial antibacterial soap -clean basin large enough to submerge wound (to be rinsed and dried between uses) -clean dry towel -Sterile gauze -comfortable tape or self adhesive wrap (Coban or ACE)   Perform 3 times per day: 1. Place the antibacterial liquid soap in the basin and add warm water and mix thoroughly.  (The water should be sudsy) 2. Soak wound in warm soapy water for 15 minutes in clean basin 3. Gently pat dry with clean towel 4. Place gauze over wound 5. Wrap with ACE wrap   02/25/23 5956            Follow-up Information     Chiaramonti, Edsel Petrin, MD Follow up in 1 week(s).   Specialties: Orthopedic Surgery, Hand Surgery Contact information: [address] [place] [address] [phone]                Allergies  Allergen Reactions   Sulfa Antibiotics Shortness Of Breath and Rash   Adhesive [Tape] Other (See Comments)    Causes redness, please use paper tape   Keflex [Cephalexin] Other (See Comments)    Possibly caused lethargy (taken with Flagyl) Tolerated cefepime 02/20/23   Lisinopril Cough   Metronidazole Other (See Comments)    Possibly caused lethargy (taken with keflex)   Teflaro [Ceftaroline] Other (See Comments)    Lethargy, possibly rash Tolerated cefepime 02/20/23    Consultations: renal ID Hand surgery   Procedures/Studies: DG Chest Port 1 View  Result Date: 02/22/2023 CLINICAL DATA:  Shortness of breath EXAM: PORTABLE CHEST 1 VIEW  COMPARISON:  12/30/2022 FINDINGS: Heart size is mildly enlarged. There is blunting of the right costophrenic angle. Scar versus atelectasis within the right base. No consolidative change identified. Thoracolumbar scoliosis deformity. IMPRESSION: 1. Blunting of the right costophrenic angle may reflect a small pleural effusion or pleural thickening. 2. Scar versus atelectasis within the right base. Electronically Signed   By: Signa Kell M.D.   On: 02/22/2023 12:48   US RENAL  Result Date: 02/21/2023 CLINICAL DATA:  Acute kidney injury EXAM: RENAL / URINARY TRACT ULTRASOUND COMPLETE COMPARISON:  02/03/2023 FINDINGS: Right Kidney: Renal measurements: 9 x 3.8 x 4.2 cm = volume: 76.09 mL. Increased parenchymal echogenicity. No mass or hydronephrosis visualized. Left Kidney: Suboptimally visualized. None renal measurements: 9.2 x 5.2 x 4.7 cm = volume: 118.7 mL. Echogenicity within normal limits. No mass or hydronephrosis visualized. Bladder: Appears normal for degree of bladder distention. Other: Fluid noted within Morrison's pouch. IMPRESSION: 1. No acute findings. No hydronephrosis. 2. Increased parenchymal echogenicity of the right kidney compatible with medical renal disease. Electronically Signed   By: Signa Kell M.D.   On: 02/21/2023 10:28   VAS [place] UPPER EXTREMITY ARTERIAL DUPLEX  Result Date: 02/20/2023  UPPER EXTREMITY DUPLEX STUDY Patient Name:  Angela Castro  Date of Exam:   02/20/2023 Medical Rec #: [id number]       Accession #:    [id number] Date of Birth: [birth year]       Patient Gender: F Patient Age:   82 years Exam Location:  [facility] Procedure:      VAS [place] UPPER EXTREMITY ARTERIAL DUPLEX Referring Phys: Graceann Congress --------------------------------------------------------------------------------  Indications: Increased swelling, redness and drainage from her right middle              finger. No pain associated with her fingers.  Risk Factors:  Hypertension, hyperlipidemia,  Diabetes. Other Factors: Long history of multiple finger infections and prior auto  soap: Hibiclens or Dial antibacterial soap -clean basin large enough to submerge wound (to be rinsed and dried between uses) -clean dry towel -Sterile gauze -comfortable tape or self adhesive wrap (Coban or ACE)   Perform 3 times per day: 1. Place the antibacterial liquid soap in the basin and add warm water and mix thoroughly.  (The water should be sudsy) 2. Soak wound in warm soapy water for 15 minutes in clean basin 3. Gently pat dry with clean towel 4. Place gauze over wound 5. Wrap with ACE wrap   02/25/23 5956            Follow-up Information     Chiaramonti, Edsel Petrin, MD Follow up in 1 week(s).   Specialties: Orthopedic Surgery, Hand Surgery Contact information: [address] [place] [address] [phone]                Allergies  Allergen Reactions   Sulfa Antibiotics Shortness Of Breath and Rash   Adhesive [Tape] Other (See Comments)    Causes redness, please use paper tape   Keflex [Cephalexin] Other (See Comments)    Possibly caused lethargy (taken with Flagyl) Tolerated cefepime 02/20/23   Lisinopril Cough   Metronidazole Other (See Comments)    Possibly caused lethargy (taken with keflex)   Teflaro [Ceftaroline] Other (See Comments)    Lethargy, possibly rash Tolerated cefepime 02/20/23    Consultations: renal ID Hand surgery   Procedures/Studies: DG Chest Port 1 View  Result Date: 02/22/2023 CLINICAL DATA:  Shortness of breath EXAM: PORTABLE CHEST 1 VIEW  COMPARISON:  12/30/2022 FINDINGS: Heart size is mildly enlarged. There is blunting of the right costophrenic angle. Scar versus atelectasis within the right base. No consolidative change identified. Thoracolumbar scoliosis deformity. IMPRESSION: 1. Blunting of the right costophrenic angle may reflect a small pleural effusion or pleural thickening. 2. Scar versus atelectasis within the right base. Electronically Signed   By: Signa Kell M.D.   On: 02/22/2023 12:48   US RENAL  Result Date: 02/21/2023 CLINICAL DATA:  Acute kidney injury EXAM: RENAL / URINARY TRACT ULTRASOUND COMPLETE COMPARISON:  02/03/2023 FINDINGS: Right Kidney: Renal measurements: 9 x 3.8 x 4.2 cm = volume: 76.09 mL. Increased parenchymal echogenicity. No mass or hydronephrosis visualized. Left Kidney: Suboptimally visualized. None renal measurements: 9.2 x 5.2 x 4.7 cm = volume: 118.7 mL. Echogenicity within normal limits. No mass or hydronephrosis visualized. Bladder: Appears normal for degree of bladder distention. Other: Fluid noted within Morrison's pouch. IMPRESSION: 1. No acute findings. No hydronephrosis. 2. Increased parenchymal echogenicity of the right kidney compatible with medical renal disease. Electronically Signed   By: Signa Kell M.D.   On: 02/21/2023 10:28   VAS [place] UPPER EXTREMITY ARTERIAL DUPLEX  Result Date: 02/20/2023  UPPER EXTREMITY DUPLEX STUDY Patient Name:  Angela Castro  Date of Exam:   02/20/2023 Medical Rec #: [id number]       Accession #:    [id number] Date of Birth: [birth year]       Patient Gender: F Patient Age:   82 years Exam Location:  [facility] Procedure:      VAS [place] UPPER EXTREMITY ARTERIAL DUPLEX Referring Phys: Graceann Congress --------------------------------------------------------------------------------  Indications: Increased swelling, redness and drainage from her right middle              finger. No pain associated with her fingers.  Risk Factors:  Hypertension, hyperlipidemia,  Diabetes. Other Factors: Long history of multiple finger infections and prior auto  soap: Hibiclens or Dial antibacterial soap -clean basin large enough to submerge wound (to be rinsed and dried between uses) -clean dry towel -Sterile gauze -comfortable tape or self adhesive wrap (Coban or ACE)   Perform 3 times per day: 1. Place the antibacterial liquid soap in the basin and add warm water and mix thoroughly.  (The water should be sudsy) 2. Soak wound in warm soapy water for 15 minutes in clean basin 3. Gently pat dry with clean towel 4. Place gauze over wound 5. Wrap with ACE wrap   02/25/23 5956            Follow-up Information     Chiaramonti, Edsel Petrin, MD Follow up in 1 week(s).   Specialties: Orthopedic Surgery, Hand Surgery Contact information: [address] [place] [address] [phone]                Allergies  Allergen Reactions   Sulfa Antibiotics Shortness Of Breath and Rash   Adhesive [Tape] Other (See Comments)    Causes redness, please use paper tape   Keflex [Cephalexin] Other (See Comments)    Possibly caused lethargy (taken with Flagyl) Tolerated cefepime 02/20/23   Lisinopril Cough   Metronidazole Other (See Comments)    Possibly caused lethargy (taken with keflex)   Teflaro [Ceftaroline] Other (See Comments)    Lethargy, possibly rash Tolerated cefepime 02/20/23    Consultations: renal ID Hand surgery   Procedures/Studies: DG Chest Port 1 View  Result Date: 02/22/2023 CLINICAL DATA:  Shortness of breath EXAM: PORTABLE CHEST 1 VIEW  COMPARISON:  12/30/2022 FINDINGS: Heart size is mildly enlarged. There is blunting of the right costophrenic angle. Scar versus atelectasis within the right base. No consolidative change identified. Thoracolumbar scoliosis deformity. IMPRESSION: 1. Blunting of the right costophrenic angle may reflect a small pleural effusion or pleural thickening. 2. Scar versus atelectasis within the right base. Electronically Signed   By: Signa Kell M.D.   On: 02/22/2023 12:48   US RENAL  Result Date: 02/21/2023 CLINICAL DATA:  Acute kidney injury EXAM: RENAL / URINARY TRACT ULTRASOUND COMPLETE COMPARISON:  02/03/2023 FINDINGS: Right Kidney: Renal measurements: 9 x 3.8 x 4.2 cm = volume: 76.09 mL. Increased parenchymal echogenicity. No mass or hydronephrosis visualized. Left Kidney: Suboptimally visualized. None renal measurements: 9.2 x 5.2 x 4.7 cm = volume: 118.7 mL. Echogenicity within normal limits. No mass or hydronephrosis visualized. Bladder: Appears normal for degree of bladder distention. Other: Fluid noted within Morrison's pouch. IMPRESSION: 1. No acute findings. No hydronephrosis. 2. Increased parenchymal echogenicity of the right kidney compatible with medical renal disease. Electronically Signed   By: Signa Kell M.D.   On: 02/21/2023 10:28   VAS [place] UPPER EXTREMITY ARTERIAL DUPLEX  Result Date: 02/20/2023  UPPER EXTREMITY DUPLEX STUDY Patient Name:  Angela Castro  Date of Exam:   02/20/2023 Medical Rec #: [id number]       Accession #:    [id number] Date of Birth: [birth year]       Patient Gender: F Patient Age:   82 years Exam Location:  [facility] Procedure:      VAS [place] UPPER EXTREMITY ARTERIAL DUPLEX Referring Phys: Graceann Congress --------------------------------------------------------------------------------  Indications: Increased swelling, redness and drainage from her right middle              finger. No pain associated with her fingers.  Risk Factors:  Hypertension, hyperlipidemia,  Diabetes. Other Factors: Long history of multiple finger infections and prior auto  soap: Hibiclens or Dial antibacterial soap -clean basin large enough to submerge wound (to be rinsed and dried between uses) -clean dry towel -Sterile gauze -comfortable tape or self adhesive wrap (Coban or ACE)   Perform 3 times per day: 1. Place the antibacterial liquid soap in the basin and add warm water and mix thoroughly.  (The water should be sudsy) 2. Soak wound in warm soapy water for 15 minutes in clean basin 3. Gently pat dry with clean towel 4. Place gauze over wound 5. Wrap with ACE wrap   02/25/23 5956            Follow-up Information     Chiaramonti, Edsel Petrin, MD Follow up in 1 week(s).   Specialties: Orthopedic Surgery, Hand Surgery Contact information: [address] [place] [address] [phone]                Allergies  Allergen Reactions   Sulfa Antibiotics Shortness Of Breath and Rash   Adhesive [Tape] Other (See Comments)    Causes redness, please use paper tape   Keflex [Cephalexin] Other (See Comments)    Possibly caused lethargy (taken with Flagyl) Tolerated cefepime 02/20/23   Lisinopril Cough   Metronidazole Other (See Comments)    Possibly caused lethargy (taken with keflex)   Teflaro [Ceftaroline] Other (See Comments)    Lethargy, possibly rash Tolerated cefepime 02/20/23    Consultations: renal ID Hand surgery   Procedures/Studies: DG Chest Port 1 View  Result Date: 02/22/2023 CLINICAL DATA:  Shortness of breath EXAM: PORTABLE CHEST 1 VIEW  COMPARISON:  12/30/2022 FINDINGS: Heart size is mildly enlarged. There is blunting of the right costophrenic angle. Scar versus atelectasis within the right base. No consolidative change identified. Thoracolumbar scoliosis deformity. IMPRESSION: 1. Blunting of the right costophrenic angle may reflect a small pleural effusion or pleural thickening. 2. Scar versus atelectasis within the right base. Electronically Signed   By: Signa Kell M.D.   On: 02/22/2023 12:48   US RENAL  Result Date: 02/21/2023 CLINICAL DATA:  Acute kidney injury EXAM: RENAL / URINARY TRACT ULTRASOUND COMPLETE COMPARISON:  02/03/2023 FINDINGS: Right Kidney: Renal measurements: 9 x 3.8 x 4.2 cm = volume: 76.09 mL. Increased parenchymal echogenicity. No mass or hydronephrosis visualized. Left Kidney: Suboptimally visualized. None renal measurements: 9.2 x 5.2 x 4.7 cm = volume: 118.7 mL. Echogenicity within normal limits. No mass or hydronephrosis visualized. Bladder: Appears normal for degree of bladder distention. Other: Fluid noted within Morrison's pouch. IMPRESSION: 1. No acute findings. No hydronephrosis. 2. Increased parenchymal echogenicity of the right kidney compatible with medical renal disease. Electronically Signed   By: Signa Kell M.D.   On: 02/21/2023 10:28   VAS [place] UPPER EXTREMITY ARTERIAL DUPLEX  Result Date: 02/20/2023  UPPER EXTREMITY DUPLEX STUDY Patient Name:  Angela Castro  Date of Exam:   02/20/2023 Medical Rec #: [id number]       Accession #:    [id number] Date of Birth: [birth year]       Patient Gender: F Patient Age:   82 years Exam Location:  [facility] Procedure:      VAS [place] UPPER EXTREMITY ARTERIAL DUPLEX Referring Phys: Graceann Congress --------------------------------------------------------------------------------  Indications: Increased swelling, redness and drainage from her right middle              finger. No pain associated with her fingers.  Risk Factors:  Hypertension, hyperlipidemia,  Diabetes. Other Factors: Long history of multiple finger infections and prior auto  soap: Hibiclens or Dial antibacterial soap -clean basin large enough to submerge wound (to be rinsed and dried between uses) -clean dry towel -Sterile gauze -comfortable tape or self adhesive wrap (Coban or ACE)   Perform 3 times per day: 1. Place the antibacterial liquid soap in the basin and add warm water and mix thoroughly.  (The water should be sudsy) 2. Soak wound in warm soapy water for 15 minutes in clean basin 3. Gently pat dry with clean towel 4. Place gauze over wound 5. Wrap with ACE wrap   02/25/23 5956            Follow-up Information     Chiaramonti, Edsel Petrin, MD Follow up in 1 week(s).   Specialties: Orthopedic Surgery, Hand Surgery Contact information: [address] [place] [address] [phone]                Allergies  Allergen Reactions   Sulfa Antibiotics Shortness Of Breath and Rash   Adhesive [Tape] Other (See Comments)    Causes redness, please use paper tape   Keflex [Cephalexin] Other (See Comments)    Possibly caused lethargy (taken with Flagyl) Tolerated cefepime 02/20/23   Lisinopril Cough   Metronidazole Other (See Comments)    Possibly caused lethargy (taken with keflex)   Teflaro [Ceftaroline] Other (See Comments)    Lethargy, possibly rash Tolerated cefepime 02/20/23    Consultations: renal ID Hand surgery   Procedures/Studies: DG Chest Port 1 View  Result Date: 02/22/2023 CLINICAL DATA:  Shortness of breath EXAM: PORTABLE CHEST 1 VIEW  COMPARISON:  12/30/2022 FINDINGS: Heart size is mildly enlarged. There is blunting of the right costophrenic angle. Scar versus atelectasis within the right base. No consolidative change identified. Thoracolumbar scoliosis deformity. IMPRESSION: 1. Blunting of the right costophrenic angle may reflect a small pleural effusion or pleural thickening. 2. Scar versus atelectasis within the right base. Electronically Signed   By: Signa Kell M.D.   On: 02/22/2023 12:48   US RENAL  Result Date: 02/21/2023 CLINICAL DATA:  Acute kidney injury EXAM: RENAL / URINARY TRACT ULTRASOUND COMPLETE COMPARISON:  02/03/2023 FINDINGS: Right Kidney: Renal measurements: 9 x 3.8 x 4.2 cm = volume: 76.09 mL. Increased parenchymal echogenicity. No mass or hydronephrosis visualized. Left Kidney: Suboptimally visualized. None renal measurements: 9.2 x 5.2 x 4.7 cm = volume: 118.7 mL. Echogenicity within normal limits. No mass or hydronephrosis visualized. Bladder: Appears normal for degree of bladder distention. Other: Fluid noted within Morrison's pouch. IMPRESSION: 1. No acute findings. No hydronephrosis. 2. Increased parenchymal echogenicity of the right kidney compatible with medical renal disease. Electronically Signed   By: Signa Kell M.D.   On: 02/21/2023 10:28   VAS [place] UPPER EXTREMITY ARTERIAL DUPLEX  Result Date: 02/20/2023  UPPER EXTREMITY DUPLEX STUDY Patient Name:  Angela Castro  Date of Exam:   02/20/2023 Medical Rec #: [id number]       Accession #:    [id number] Date of Birth: [birth year]       Patient Gender: F Patient Age:   82 years Exam Location:  [facility] Procedure:      VAS [place] UPPER EXTREMITY ARTERIAL DUPLEX Referring Phys: Graceann Congress --------------------------------------------------------------------------------  Indications: Increased swelling, redness and drainage from her right middle              finger. No pain associated with her fingers.  Risk Factors:  Hypertension, hyperlipidemia,  Diabetes. Other Factors: Long history of multiple finger infections and prior auto  soap: Hibiclens or Dial antibacterial soap -clean basin large enough to submerge wound (to be rinsed and dried between uses) -clean dry towel -Sterile gauze -comfortable tape or self adhesive wrap (Coban or ACE)   Perform 3 times per day: 1. Place the antibacterial liquid soap in the basin and add warm water and mix thoroughly.  (The water should be sudsy) 2. Soak wound in warm soapy water for 15 minutes in clean basin 3. Gently pat dry with clean towel 4. Place gauze over wound 5. Wrap with ACE wrap   02/25/23 5956            Follow-up Information     Chiaramonti, Edsel Petrin, MD Follow up in 1 week(s).   Specialties: Orthopedic Surgery, Hand Surgery Contact information: [address] [place] [address] [phone]                Allergies  Allergen Reactions   Sulfa Antibiotics Shortness Of Breath and Rash   Adhesive [Tape] Other (See Comments)    Causes redness, please use paper tape   Keflex [Cephalexin] Other (See Comments)    Possibly caused lethargy (taken with Flagyl) Tolerated cefepime 02/20/23   Lisinopril Cough   Metronidazole Other (See Comments)    Possibly caused lethargy (taken with keflex)   Teflaro [Ceftaroline] Other (See Comments)    Lethargy, possibly rash Tolerated cefepime 02/20/23    Consultations: renal ID Hand surgery   Procedures/Studies: DG Chest Port 1 View  Result Date: 02/22/2023 CLINICAL DATA:  Shortness of breath EXAM: PORTABLE CHEST 1 VIEW  COMPARISON:  12/30/2022 FINDINGS: Heart size is mildly enlarged. There is blunting of the right costophrenic angle. Scar versus atelectasis within the right base. No consolidative change identified. Thoracolumbar scoliosis deformity. IMPRESSION: 1. Blunting of the right costophrenic angle may reflect a small pleural effusion or pleural thickening. 2. Scar versus atelectasis within the right base. Electronically Signed   By: Signa Kell M.D.   On: 02/22/2023 12:48   US RENAL  Result Date: 02/21/2023 CLINICAL DATA:  Acute kidney injury EXAM: RENAL / URINARY TRACT ULTRASOUND COMPLETE COMPARISON:  02/03/2023 FINDINGS: Right Kidney: Renal measurements: 9 x 3.8 x 4.2 cm = volume: 76.09 mL. Increased parenchymal echogenicity. No mass or hydronephrosis visualized. Left Kidney: Suboptimally visualized. None renal measurements: 9.2 x 5.2 x 4.7 cm = volume: 118.7 mL. Echogenicity within normal limits. No mass or hydronephrosis visualized. Bladder: Appears normal for degree of bladder distention. Other: Fluid noted within Morrison's pouch. IMPRESSION: 1. No acute findings. No hydronephrosis. 2. Increased parenchymal echogenicity of the right kidney compatible with medical renal disease. Electronically Signed   By: Signa Kell M.D.   On: 02/21/2023 10:28   VAS [place] UPPER EXTREMITY ARTERIAL DUPLEX  Result Date: 02/20/2023  UPPER EXTREMITY DUPLEX STUDY Patient Name:  Angela Castro  Date of Exam:   02/20/2023 Medical Rec #: [id number]       Accession #:    [id number] Date of Birth: [birth year]       Patient Gender: F Patient Age:   82 years Exam Location:  [facility] Procedure:      VAS [place] UPPER EXTREMITY ARTERIAL DUPLEX Referring Phys: Graceann Congress --------------------------------------------------------------------------------  Indications: Increased swelling, redness and drainage from her right middle              finger. No pain associated with her fingers.  Risk Factors:  Hypertension, hyperlipidemia,  Diabetes. Other Factors: Long history of multiple finger infections and prior auto  Physician Discharge Summary  Angela Castro WUJ:[id number] DOB: Jul 01, 1940 DOA: 02/19/2023  PCP: Farris Has, MD  Admit date: 02/19/2023 Discharge date: 02/25/2023  Admitted From: (Home) Disposition:  (Home)  Recommendations for Outpatient Follow-up:  Follow up with PCP in 1-2 weeks Please obtain BMP/CBC in one week Please continue with wound care as instructed below, patient to follow with hand surgery in 1 week, and to follow-up with ID on scheduled appointment 03/17/2023.   Diet recommendation: Heart Healthy / Carb Modified  Brief/Interim Summary: 82 y.o. female with medical history significant of DM2, diastolic CHF,  CKD 3, IDA, gout, neuropathy. Presented with  right hand 3rd finger swelling and redness.   Presents with a right hand third finger swelling redness was at wound care yesterday and was told to go to emergency department has history of wounds that required an amputation with underlying history of diabetes there is some drainage of a right hand finger, purulent drainage for the past 3 days. She was originally seen at [facility], ER but left without being seen by provider.  She was diagnosed with severe right hand cellulitis and possible abscess and admitted to the hospital.  Sepsis, POA Cellulitis, abscess, right middle finger osteomyelitis  Her infection has been ongoing for the last several days, he has evidence of sepsis upon admission, has been placed on appropriate IV antibiotics, type screen done, MRSA nasal PCR so far is negative.  Hand surgery consulted, MRI noted, patient underwent Right long finger partial amputation, I&D of flexor tendon sheath and deep spaces of palm & Open carpal tunnel release on 02/20/2023 by Ramon Dredge MD, as noted, ID consulted, infection is clinically improving, recommendation to discharge with p.o. Augmentin for total of 40 days, with outpatient appointment has been arranged with ID -continue with wound care at time of  discharge, home health RN to assist with teaching and wound dressing changes.    Chronic diastolic CHF EF 60% on echocardiogram done 2 years ago.   -Compensated, diuresis been changed from Bumex to torsemide per nephrology recommendations.    AKI on CKD 4.  Baseline creatinine around 2.6.  Renal input greatly appreciated, creatinine peaked at 4, improving, medication has been adjusted, will hold losartan at time of discharge.    Essential hypertension.  On Zebeta, blood pressure is elevated this morning, so resumed on home dose Norvasc prior to discharge, so continue Norvasc and Zebeta.  Losartan has been discontinued at time of discharge due to AKI.   DM type II. - hold glipizide at time of discharge due to worsening renal function, continue with home dose Lantus and Januvia.   -Gabapentin dose has been decreased due to worsening renal function.       Discharge Diagnoses:  Principal Problem:   Finger osteomyelitis, right (HCC) Active Problems:   Diabetes mellitus, type II, insulin dependent (HCC)   Hypertension, essential   CKD (chronic kidney disease) stage 3, GFR 30-59 ml/min (HCC)   Chronic heart failure with preserved ejection fraction (HCC)   Hypomagnesemia   Anemia   Suppurative tenosynovitis of flexor tendon of right hand    Discharge Instructions  Discharge Instructions     Diet - low sodium heart healthy   Complete by: As directed    Discharge instructions   Complete by: As directed    Follow with Primary MD Farris Has, MD in 7 days   Get CBC, CMP,  checked  by Primary MD next visit.    Activity: As tolerated with Full  Physician Discharge Summary  Angela Castro WUJ:[id number] DOB: Jul 01, 1940 DOA: 02/19/2023  PCP: Farris Has, MD  Admit date: 02/19/2023 Discharge date: 02/25/2023  Admitted From: (Home) Disposition:  (Home)  Recommendations for Outpatient Follow-up:  Follow up with PCP in 1-2 weeks Please obtain BMP/CBC in one week Please continue with wound care as instructed below, patient to follow with hand surgery in 1 week, and to follow-up with ID on scheduled appointment 03/17/2023.   Diet recommendation: Heart Healthy / Carb Modified  Brief/Interim Summary: 82 y.o. female with medical history significant of DM2, diastolic CHF,  CKD 3, IDA, gout, neuropathy. Presented with  right hand 3rd finger swelling and redness.   Presents with a right hand third finger swelling redness was at wound care yesterday and was told to go to emergency department has history of wounds that required an amputation with underlying history of diabetes there is some drainage of a right hand finger, purulent drainage for the past 3 days. She was originally seen at [facility], ER but left without being seen by provider.  She was diagnosed with severe right hand cellulitis and possible abscess and admitted to the hospital.  Sepsis, POA Cellulitis, abscess, right middle finger osteomyelitis  Her infection has been ongoing for the last several days, he has evidence of sepsis upon admission, has been placed on appropriate IV antibiotics, type screen done, MRSA nasal PCR so far is negative.  Hand surgery consulted, MRI noted, patient underwent Right long finger partial amputation, I&D of flexor tendon sheath and deep spaces of palm & Open carpal tunnel release on 02/20/2023 by Ramon Dredge MD, as noted, ID consulted, infection is clinically improving, recommendation to discharge with p.o. Augmentin for total of 40 days, with outpatient appointment has been arranged with ID -continue with wound care at time of  discharge, home health RN to assist with teaching and wound dressing changes.    Chronic diastolic CHF EF 60% on echocardiogram done 2 years ago.   -Compensated, diuresis been changed from Bumex to torsemide per nephrology recommendations.    AKI on CKD 4.  Baseline creatinine around 2.6.  Renal input greatly appreciated, creatinine peaked at 4, improving, medication has been adjusted, will hold losartan at time of discharge.    Essential hypertension.  On Zebeta, blood pressure is elevated this morning, so resumed on home dose Norvasc prior to discharge, so continue Norvasc and Zebeta.  Losartan has been discontinued at time of discharge due to AKI.   DM type II. - hold glipizide at time of discharge due to worsening renal function, continue with home dose Lantus and Januvia.   -Gabapentin dose has been decreased due to worsening renal function.       Discharge Diagnoses:  Principal Problem:   Finger osteomyelitis, right (HCC) Active Problems:   Diabetes mellitus, type II, insulin dependent (HCC)   Hypertension, essential   CKD (chronic kidney disease) stage 3, GFR 30-59 ml/min (HCC)   Chronic heart failure with preserved ejection fraction (HCC)   Hypomagnesemia   Anemia   Suppurative tenosynovitis of flexor tendon of right hand    Discharge Instructions  Discharge Instructions     Diet - low sodium heart healthy   Complete by: As directed    Discharge instructions   Complete by: As directed    Follow with Primary MD Farris Has, MD in 7 days   Get CBC, CMP,  checked  by Primary MD next visit.    Activity: As tolerated with Full  soap: Hibiclens or Dial antibacterial soap -clean basin large enough to submerge wound (to be rinsed and dried between uses) -clean dry towel -Sterile gauze -comfortable tape or self adhesive wrap (Coban or ACE)   Perform 3 times per day: 1. Place the antibacterial liquid soap in the basin and add warm water and mix thoroughly.  (The water should be sudsy) 2. Soak wound in warm soapy water for 15 minutes in clean basin 3. Gently pat dry with clean towel 4. Place gauze over wound 5. Wrap with ACE wrap   02/25/23 5956            Follow-up Information     Chiaramonti, Edsel Petrin, MD Follow up in 1 week(s).   Specialties: Orthopedic Surgery, Hand Surgery Contact information: [address] [place] [address] [phone]                Allergies  Allergen Reactions   Sulfa Antibiotics Shortness Of Breath and Rash   Adhesive [Tape] Other (See Comments)    Causes redness, please use paper tape   Keflex [Cephalexin] Other (See Comments)    Possibly caused lethargy (taken with Flagyl) Tolerated cefepime 02/20/23   Lisinopril Cough   Metronidazole Other (See Comments)    Possibly caused lethargy (taken with keflex)   Teflaro [Ceftaroline] Other (See Comments)    Lethargy, possibly rash Tolerated cefepime 02/20/23    Consultations: renal ID Hand surgery   Procedures/Studies: DG Chest Port 1 View  Result Date: 02/22/2023 CLINICAL DATA:  Shortness of breath EXAM: PORTABLE CHEST 1 VIEW  COMPARISON:  12/30/2022 FINDINGS: Heart size is mildly enlarged. There is blunting of the right costophrenic angle. Scar versus atelectasis within the right base. No consolidative change identified. Thoracolumbar scoliosis deformity. IMPRESSION: 1. Blunting of the right costophrenic angle may reflect a small pleural effusion or pleural thickening. 2. Scar versus atelectasis within the right base. Electronically Signed   By: Signa Kell M.D.   On: 02/22/2023 12:48   US RENAL  Result Date: 02/21/2023 CLINICAL DATA:  Acute kidney injury EXAM: RENAL / URINARY TRACT ULTRASOUND COMPLETE COMPARISON:  02/03/2023 FINDINGS: Right Kidney: Renal measurements: 9 x 3.8 x 4.2 cm = volume: 76.09 mL. Increased parenchymal echogenicity. No mass or hydronephrosis visualized. Left Kidney: Suboptimally visualized. None renal measurements: 9.2 x 5.2 x 4.7 cm = volume: 118.7 mL. Echogenicity within normal limits. No mass or hydronephrosis visualized. Bladder: Appears normal for degree of bladder distention. Other: Fluid noted within Morrison's pouch. IMPRESSION: 1. No acute findings. No hydronephrosis. 2. Increased parenchymal echogenicity of the right kidney compatible with medical renal disease. Electronically Signed   By: Signa Kell M.D.   On: 02/21/2023 10:28   VAS [place] UPPER EXTREMITY ARTERIAL DUPLEX  Result Date: 02/20/2023  UPPER EXTREMITY DUPLEX STUDY Patient Name:  Angela Castro  Date of Exam:   02/20/2023 Medical Rec #: [id number]       Accession #:    [id number] Date of Birth: [birth year]       Patient Gender: F Patient Age:   82 years Exam Location:  [facility] Procedure:      VAS [place] UPPER EXTREMITY ARTERIAL DUPLEX Referring Phys: Graceann Congress --------------------------------------------------------------------------------  Indications: Increased swelling, redness and drainage from her right middle              finger. No pain associated with her fingers.  Risk Factors:  Hypertension, hyperlipidemia,  Diabetes. Other Factors: Long history of multiple finger infections and prior auto

## 2023-02-27 DIAGNOSIS — M65841 Other synovitis and tenosynovitis, right hand: Secondary | ICD-10-CM | POA: Diagnosis not present

## 2023-02-27 DIAGNOSIS — Z7984 Long term (current) use of oral hypoglycemic drugs: Secondary | ICD-10-CM | POA: Diagnosis not present

## 2023-02-27 DIAGNOSIS — Z794 Long term (current) use of insulin: Secondary | ICD-10-CM | POA: Diagnosis not present

## 2023-02-27 DIAGNOSIS — M86141 Other acute osteomyelitis, right hand: Secondary | ICD-10-CM | POA: Diagnosis not present

## 2023-02-27 DIAGNOSIS — E1122 Type 2 diabetes mellitus with diabetic chronic kidney disease: Secondary | ICD-10-CM | POA: Diagnosis not present

## 2023-02-27 DIAGNOSIS — A419 Sepsis, unspecified organism: Secondary | ICD-10-CM | POA: Diagnosis not present

## 2023-02-27 DIAGNOSIS — I130 Hypertensive heart and chronic kidney disease with heart failure and stage 1 through stage 4 chronic kidney disease, or unspecified chronic kidney disease: Secondary | ICD-10-CM | POA: Diagnosis not present

## 2023-02-27 DIAGNOSIS — I5042 Chronic combined systolic (congestive) and diastolic (congestive) heart failure: Secondary | ICD-10-CM | POA: Diagnosis not present

## 2023-02-27 DIAGNOSIS — N183 Chronic kidney disease, stage 3 unspecified: Secondary | ICD-10-CM | POA: Diagnosis not present

## 2023-03-02 DIAGNOSIS — M25641 Stiffness of right hand, not elsewhere classified: Secondary | ICD-10-CM | POA: Diagnosis not present

## 2023-03-02 DIAGNOSIS — Z4889 Encounter for other specified surgical aftercare: Secondary | ICD-10-CM | POA: Diagnosis not present

## 2023-03-02 DIAGNOSIS — T148XXA Other injury of unspecified body region, initial encounter: Secondary | ICD-10-CM | POA: Diagnosis not present

## 2023-03-05 DIAGNOSIS — M86141 Other acute osteomyelitis, right hand: Secondary | ICD-10-CM | POA: Diagnosis not present

## 2023-03-05 DIAGNOSIS — M109 Gout, unspecified: Secondary | ICD-10-CM | POA: Diagnosis not present

## 2023-03-05 DIAGNOSIS — A419 Sepsis, unspecified organism: Secondary | ICD-10-CM | POA: Diagnosis not present

## 2023-03-05 DIAGNOSIS — M65841 Other synovitis and tenosynovitis, right hand: Secondary | ICD-10-CM | POA: Diagnosis not present

## 2023-03-05 DIAGNOSIS — E1122 Type 2 diabetes mellitus with diabetic chronic kidney disease: Secondary | ICD-10-CM | POA: Diagnosis not present

## 2023-03-05 DIAGNOSIS — I130 Hypertensive heart and chronic kidney disease with heart failure and stage 1 through stage 4 chronic kidney disease, or unspecified chronic kidney disease: Secondary | ICD-10-CM | POA: Diagnosis not present

## 2023-03-05 DIAGNOSIS — D509 Iron deficiency anemia, unspecified: Secondary | ICD-10-CM | POA: Diagnosis not present

## 2023-03-05 DIAGNOSIS — I5042 Chronic combined systolic (congestive) and diastolic (congestive) heart failure: Secondary | ICD-10-CM | POA: Diagnosis not present

## 2023-03-05 DIAGNOSIS — N183 Chronic kidney disease, stage 3 unspecified: Secondary | ICD-10-CM | POA: Diagnosis not present

## 2023-03-05 DIAGNOSIS — Z794 Long term (current) use of insulin: Secondary | ICD-10-CM | POA: Diagnosis not present

## 2023-03-05 DIAGNOSIS — Z792 Long term (current) use of antibiotics: Secondary | ICD-10-CM | POA: Diagnosis not present

## 2023-03-05 DIAGNOSIS — Z7984 Long term (current) use of oral hypoglycemic drugs: Secondary | ICD-10-CM | POA: Diagnosis not present

## 2023-03-10 DIAGNOSIS — I5042 Chronic combined systolic (congestive) and diastolic (congestive) heart failure: Secondary | ICD-10-CM | POA: Diagnosis not present

## 2023-03-10 DIAGNOSIS — Z794 Long term (current) use of insulin: Secondary | ICD-10-CM | POA: Diagnosis not present

## 2023-03-10 DIAGNOSIS — I130 Hypertensive heart and chronic kidney disease with heart failure and stage 1 through stage 4 chronic kidney disease, or unspecified chronic kidney disease: Secondary | ICD-10-CM | POA: Diagnosis not present

## 2023-03-10 DIAGNOSIS — Z7984 Long term (current) use of oral hypoglycemic drugs: Secondary | ICD-10-CM | POA: Diagnosis not present

## 2023-03-10 DIAGNOSIS — A419 Sepsis, unspecified organism: Secondary | ICD-10-CM | POA: Diagnosis not present

## 2023-03-10 DIAGNOSIS — N183 Chronic kidney disease, stage 3 unspecified: Secondary | ICD-10-CM | POA: Diagnosis not present

## 2023-03-10 DIAGNOSIS — M65841 Other synovitis and tenosynovitis, right hand: Secondary | ICD-10-CM | POA: Diagnosis not present

## 2023-03-10 DIAGNOSIS — E1122 Type 2 diabetes mellitus with diabetic chronic kidney disease: Secondary | ICD-10-CM | POA: Diagnosis not present

## 2023-03-10 DIAGNOSIS — M86141 Other acute osteomyelitis, right hand: Secondary | ICD-10-CM | POA: Diagnosis not present

## 2023-03-11 DIAGNOSIS — D631 Anemia in chronic kidney disease: Secondary | ICD-10-CM | POA: Diagnosis not present

## 2023-03-11 DIAGNOSIS — R809 Proteinuria, unspecified: Secondary | ICD-10-CM | POA: Diagnosis not present

## 2023-03-11 DIAGNOSIS — N179 Acute kidney failure, unspecified: Secondary | ICD-10-CM | POA: Diagnosis not present

## 2023-03-11 DIAGNOSIS — I129 Hypertensive chronic kidney disease with stage 1 through stage 4 chronic kidney disease, or unspecified chronic kidney disease: Secondary | ICD-10-CM | POA: Diagnosis not present

## 2023-03-11 DIAGNOSIS — R600 Localized edema: Secondary | ICD-10-CM | POA: Diagnosis not present

## 2023-03-11 DIAGNOSIS — E1122 Type 2 diabetes mellitus with diabetic chronic kidney disease: Secondary | ICD-10-CM | POA: Diagnosis not present

## 2023-03-11 DIAGNOSIS — N184 Chronic kidney disease, stage 4 (severe): Secondary | ICD-10-CM | POA: Diagnosis not present

## 2023-03-13 DIAGNOSIS — E113293 Type 2 diabetes mellitus with mild nonproliferative diabetic retinopathy without macular edema, bilateral: Secondary | ICD-10-CM | POA: Diagnosis not present

## 2023-03-13 DIAGNOSIS — M869 Osteomyelitis, unspecified: Secondary | ICD-10-CM | POA: Diagnosis not present

## 2023-03-13 DIAGNOSIS — E785 Hyperlipidemia, unspecified: Secondary | ICD-10-CM | POA: Diagnosis not present

## 2023-03-13 DIAGNOSIS — E1122 Type 2 diabetes mellitus with diabetic chronic kidney disease: Secondary | ICD-10-CM | POA: Diagnosis not present

## 2023-03-13 DIAGNOSIS — E1140 Type 2 diabetes mellitus with diabetic neuropathy, unspecified: Secondary | ICD-10-CM | POA: Diagnosis not present

## 2023-03-13 DIAGNOSIS — N1832 Chronic kidney disease, stage 3b: Secondary | ICD-10-CM | POA: Diagnosis not present

## 2023-03-13 DIAGNOSIS — Z23 Encounter for immunization: Secondary | ICD-10-CM | POA: Diagnosis not present

## 2023-03-13 DIAGNOSIS — Z0000 Encounter for general adult medical examination without abnormal findings: Secondary | ICD-10-CM | POA: Diagnosis not present

## 2023-03-13 DIAGNOSIS — I130 Hypertensive heart and chronic kidney disease with heart failure and stage 1 through stage 4 chronic kidney disease, or unspecified chronic kidney disease: Secondary | ICD-10-CM | POA: Diagnosis not present

## 2023-03-17 ENCOUNTER — Encounter: Payer: Self-pay | Admitting: Infectious Disease

## 2023-03-17 ENCOUNTER — Other Ambulatory Visit: Payer: Self-pay

## 2023-03-17 ENCOUNTER — Ambulatory Visit (INDEPENDENT_AMBULATORY_CARE_PROVIDER_SITE_OTHER): Payer: Medicare PPO | Admitting: Infectious Disease

## 2023-03-17 VITALS — BP 147/86 | HR 81 | Temp 98.0°F

## 2023-03-17 DIAGNOSIS — Z4889 Encounter for other specified surgical aftercare: Secondary | ICD-10-CM | POA: Diagnosis not present

## 2023-03-17 DIAGNOSIS — T148XXA Other injury of unspecified body region, initial encounter: Secondary | ICD-10-CM | POA: Diagnosis not present

## 2023-03-17 DIAGNOSIS — M25641 Stiffness of right hand, not elsewhere classified: Secondary | ICD-10-CM | POA: Diagnosis not present

## 2023-03-17 DIAGNOSIS — N183 Chronic kidney disease, stage 3 unspecified: Secondary | ICD-10-CM | POA: Diagnosis not present

## 2023-03-17 DIAGNOSIS — M869 Osteomyelitis, unspecified: Secondary | ICD-10-CM

## 2023-03-17 DIAGNOSIS — Z7185 Encounter for immunization safety counseling: Secondary | ICD-10-CM

## 2023-03-17 HISTORY — DX: Encounter for immunization safety counseling: Z71.85

## 2023-03-17 NOTE — Progress Notes (Signed)
Subjective:  Chief complaint follow-up for osteomyelitis of the finger  Patient ID: Angela Castro, female    DOB: [birth year], 82 y.o.   MRN: [id number]  HPI  82 y.o. female with  DM, CKD prior osteomyelitis , PVD, now with osteomyelitis of 3rd finger with flexor tenosynovitis and hand abscess status post I&D.  Cultures have yielded Proteus mirabilis, Pastuerella speices, Viridans on wound swab culture and   Proteus mirabilis Pasteurella species Streptococcus anginosus  Bacteroides fragilis and Prevotella   The only organism that was not quite as S to penicillins was the Viridans organism that did nto come from deep culture and which we nonetheless be something that we were confident we can overcome with a higher dose of augmentin which for her will be 875/125 mg BID she was discharged on.  Her amputation site appears quite clean and well-healed.  Her palmar incision was still dressed but her daughter showed me a photo of it.  I had wanted to get a sed rate and CRP to monitor response to therapy but she really did not want to have blood drawn today.  I think that is still a reasonable choice and I am confident her bone infection has been cured.  She does have follow-up with her orthopedic surgeon.    Past Medical History:  Diagnosis Date   Asthma    Diabetes mellitus without complication (HCC)    Hypertension    Vaccine counseling 03/17/2023    Past Surgical History:  Procedure Laterality Date   AMPUTATION Left 08/26/2016   Procedure: AMPUTATION RAY/1ST;  Surgeon: Toni Arthurs, MD;  Location: [facility] OR;  Service: Orthopedics;  Laterality: Left;   I & D EXTREMITY Right 02/20/2023   Procedure: IRRIGATION AND DEBRIDEMENT vs. AMPUTATION RIGHT LONG FINGER;  Surgeon: Ramon Dredge, MD;  Location: [facility] OR;  Service: Orthopedics;  Laterality: Right;   IR GENERIC HISTORICAL  07/30/2016   IR US GUIDE VASC ACCESS RIGHT 07/30/2016 Richarda Overlie, MD [facility]   IR GENERIC HISTORICAL   07/30/2016   IR FLUORO GUIDE CV MIDLINE PICC RIGHT 07/30/2016 Richarda Overlie, MD [facility]   KNEE ARTHROSCOPY     x 4   ORIF FEMUR FRACTURE Right 03/25/2021   Procedure: OPEN REDUCTION INTERNAL FIXATION (ORIF) DISTAL FEMUR FRACTURE;  Surgeon: Roby Lofts, MD;  Location: [facility] OR;  Service: Orthopedics;  Laterality: Right;   REPLACEMENT TOTAL KNEE      Family History  Problem Relation Age of Onset   Heart disease Mother       Social History   Socioeconomic History   Marital status: Married    Spouse name: Not on file   Number of children: 2   Years of education: Not on file   Highest education level: Not on file  Occupational History   Not on file  Tobacco Use   Smoking status: Never   Smokeless tobacco: Never  Vaping Use   Vaping status: Never Used  Substance and Sexual Activity   Alcohol use: No   Drug use: No   Sexual activity: Not on file  Other Topics Concern   Not on file  Social History Narrative   Not on file   Social Determinants of Health   Financial Resource Strain: Not on file  Food Insecurity: No Food Insecurity (02/19/2023)   Hunger Vital Sign    Worried About Running Out of Food in the Last Year: Never true    Ran Out of Food in the Last Year: Never  true  Transportation Needs: No Transportation Needs (02/19/2023)   PRAPARE - Administrator, Civil Service (Medical): No    Lack of Transportation (Non-Medical): No  Physical Activity: Not on file  Stress: Not on file  Social Connections: Not on file    Allergies  Allergen Reactions   Sulfa Antibiotics Shortness Of Breath and Rash   Adhesive [Tape] Other (See Comments)    Causes redness, please use paper tape   Keflex [Cephalexin] Other (See Comments)    Possibly caused lethargy (taken with Flagyl) Tolerated cefepime 02/20/23   Lisinopril Cough   Metronidazole Other (See Comments)    Possibly caused lethargy (taken with keflex)   Teflaro [Ceftaroline] Other (See Comments)     Lethargy, possibly rash Tolerated cefepime 02/20/23     Current Outpatient Medications:    allopurinol (ZYLOPRIM) 100 MG tablet, Take 100 mg by mouth 2 (two) times daily. , Disp: , Rfl:    amLODipine (NORVASC) 5 MG tablet, Take 5 mg by mouth every morning., Disp: , Rfl:    amoxicillin-clavulanate (AUGMENTIN) 875-125 MG tablet, Take 1 tablet by mouth 2 (two) times daily., Disp: 78 tablet, Rfl: 0   bisoprolol (ZEBETA) 10 MG tablet, Take 10 mg by mouth in the morning., Disp: , Rfl:    Calcium Carbonate-Vitamin D (CALCIUM-D) 600-400 MG-UNIT TABS, Take 1 tablet by mouth every morning., Disp: , Rfl:    cholecalciferol (VITAMIN D3) 25 MCG (1000 UNIT) tablet, Take 1,000 Units by mouth daily., Disp: , Rfl:    clobetasol (TEMOVATE) 0.05 % external solution, Apply 1 application topically daily as needed (scalp itching/psoriasis)., Disp: , Rfl:    ferrous sulfate (FERROUSUL) 325 (65 FE) MG tablet, Take 1 tablet (325 mg total) by mouth 2 (two) times daily with a meal., Disp: 60 tablet, Rfl: 1   fluocinonide cream (LIDEX) 0.05 %, Apply 1 application topically 2 (two) times daily as needed (psoriasis). , Disp: , Rfl:    gabapentin (NEURONTIN) 100 MG capsule, Take 1 capsule (100 mg total) by mouth 2 (two) times daily., Disp: 60 capsule, Rfl: 0   HYDROcodone-acetaminophen (NORCO/VICODIN) 5-325 MG tablet, Take 1 tablet by mouth every 6 (six) hours as needed for severe pain (pain score 7-10)., Disp: 15 tablet, Rfl: 0   insulin glargine (LANTUS SOLOSTAR) 100 UNIT/ML Solostar Pen, Inject 50 Units into the skin at bedtime. (Patient taking differently: Inject 56 Units into the skin at bedtime.), Disp: , Rfl:    loratadine (CLARITIN) 10 MG tablet, Take 10 mg by mouth every morning., Disp: , Rfl:    Multiple Vitamins-Iron (MULTIVITAMIN/IRON PO), Take 1 tablet by mouth every morning., Disp: , Rfl:    simvastatin (ZOCOR) 5 MG tablet, Take 5 mg by mouth at bedtime. , Disp: , Rfl:    sitaGLIPtin (JANUVIA) 50 MG tablet, Take  50 mg by mouth every morning., Disp: , Rfl:    torsemide (DEMADEX) 20 MG tablet, Take 2 tablets (40 mg total) by mouth daily., Disp: 60 tablet, Rfl: 0   vitamin C (ASCORBIC ACID) 500 MG tablet, Take 500 mg by mouth every morning., Disp: , Rfl:    Review of Systems  Constitutional:  Negative for activity change, appetite change, chills, diaphoresis, fatigue, fever and unexpected weight change.  HENT:  Negative for congestion, rhinorrhea, sinus pressure, sneezing, sore throat and trouble swallowing.   Eyes:  Negative for photophobia and visual disturbance.  Respiratory:  Negative for cough, chest tightness, shortness of breath, wheezing and stridor.   Cardiovascular:  Negative  for chest pain, palpitations and leg swelling.  Gastrointestinal:  Negative for abdominal distention, abdominal pain, anal bleeding, blood in stool, constipation, diarrhea, nausea and vomiting.  Genitourinary:  Negative for difficulty urinating, dysuria, flank pain and hematuria.  Musculoskeletal:  Negative for arthralgias, back pain, gait problem, joint swelling and myalgias.  Skin:  Negative for color change, pallor, rash and wound.  Neurological:  Negative for dizziness, tremors, weakness, light-headedness and headaches.  Hematological:  Negative for adenopathy. Does not bruise/bleed easily.  Psychiatric/Behavioral:  Negative for agitation, behavioral problems, confusion, decreased concentration, dysphoric mood, sleep disturbance and suicidal ideas.        Objective:   Physical Exam Constitutional:      General: She is not in acute distress.    Appearance: Normal appearance. She is well-developed. She is not ill-appearing or diaphoretic.  HENT:     Head: Normocephalic and atraumatic.     Right Ear: Hearing and external ear normal.     Left Ear: Hearing and external ear normal.     Nose: No nasal deformity or rhinorrhea.  Eyes:     General: No scleral icterus.    Conjunctiva/sclera: Conjunctivae normal.      Right eye: Right conjunctiva is not injected.     Left eye: Left conjunctiva is not injected.     Pupils: Pupils are equal, round, and reactive to light.  Neck:     Vascular: No JVD.  Cardiovascular:     Rate and Rhythm: Normal rate and regular rhythm.     Heart sounds: S1 normal and S2 normal. No murmur heard. Abdominal:     General: Bowel sounds are normal. There is no distension.     Palpations: Abdomen is soft.     Tenderness: There is no abdominal tenderness.  Musculoskeletal:        General: Normal range of motion.     Right shoulder: Normal.     Left shoulder: Normal.     Cervical back: Normal range of motion and neck supple.     Right hip: Normal.     Left hip: Normal.     Right knee: Normal.     Left knee: Normal.  Lymphadenopathy:     Head:     Right side of head: No submandibular, preauricular or posterior auricular adenopathy.     Left side of head: No submandibular, preauricular or posterior auricular adenopathy.     Cervical: No cervical adenopathy.     Right cervical: No superficial or deep cervical adenopathy.    Left cervical: No superficial or deep cervical adenopathy.  Skin:    General: Skin is warm and dry.     Coloration: Skin is not pale.     Findings: No abrasion, bruising, ecchymosis, erythema, lesion or rash.     Nails: There is no clubbing.  Neurological:     Mental Status: She is alert and oriented to person, place, and time.     Sensory: No sensory deficit.     Coordination: Coordination normal.     Gait: Gait normal.  Psychiatric:        Attention and Perception: She is attentive.        Mood and Affect: Mood normal.        Speech: Speech normal.        Behavior: Behavior normal. Behavior is cooperative.        Thought Content: Thought content normal.        Judgment: Judgment normal.    Right  hand        Left hand        Assessment & Plan:   Palmar abscess and osteomyelitis status post surgery:  She is completing her  Augmentin and seems to be doing well clinically.  She wanted forego lab testing and this is not unreasonable.  She has close follow-up with her orthopedic surgeon and she will return to clinic as needed  Chronic kidney disease: Her creatinine improved to 3 when checked by her nephrologist  Vaccine counseling recommended updated COVID-vaccine which she intends to get with her husband she is had her RSV and flu shot this past week as well.

## 2023-03-19 DIAGNOSIS — M25641 Stiffness of right hand, not elsewhere classified: Secondary | ICD-10-CM | POA: Diagnosis not present

## 2023-03-19 DIAGNOSIS — T148XXA Other injury of unspecified body region, initial encounter: Secondary | ICD-10-CM | POA: Diagnosis not present

## 2023-03-19 DIAGNOSIS — Z4889 Encounter for other specified surgical aftercare: Secondary | ICD-10-CM | POA: Diagnosis not present

## 2023-03-23 DIAGNOSIS — T148XXA Other injury of unspecified body region, initial encounter: Secondary | ICD-10-CM | POA: Diagnosis not present

## 2023-03-23 DIAGNOSIS — Z4889 Encounter for other specified surgical aftercare: Secondary | ICD-10-CM | POA: Diagnosis not present

## 2023-03-23 DIAGNOSIS — M25641 Stiffness of right hand, not elsewhere classified: Secondary | ICD-10-CM | POA: Diagnosis not present

## 2023-03-23 LAB — FUNGUS CULTURE WITH STAIN

## 2023-03-23 LAB — FUNGUS CULTURE RESULT

## 2023-03-23 LAB — FUNGAL ORGANISM REFLEX

## 2023-03-26 DIAGNOSIS — Z4889 Encounter for other specified surgical aftercare: Secondary | ICD-10-CM | POA: Diagnosis not present

## 2023-03-26 DIAGNOSIS — T148XXA Other injury of unspecified body region, initial encounter: Secondary | ICD-10-CM | POA: Diagnosis not present

## 2023-03-26 DIAGNOSIS — M25641 Stiffness of right hand, not elsewhere classified: Secondary | ICD-10-CM | POA: Diagnosis not present

## 2023-03-30 DIAGNOSIS — T148XXA Other injury of unspecified body region, initial encounter: Secondary | ICD-10-CM | POA: Diagnosis not present

## 2023-03-30 DIAGNOSIS — M25641 Stiffness of right hand, not elsewhere classified: Secondary | ICD-10-CM | POA: Diagnosis not present

## 2023-03-30 DIAGNOSIS — Z4889 Encounter for other specified surgical aftercare: Secondary | ICD-10-CM | POA: Diagnosis not present

## 2023-04-07 DIAGNOSIS — T148XXA Other injury of unspecified body region, initial encounter: Secondary | ICD-10-CM | POA: Diagnosis not present

## 2023-04-07 DIAGNOSIS — Z4889 Encounter for other specified surgical aftercare: Secondary | ICD-10-CM | POA: Diagnosis not present

## 2023-04-07 DIAGNOSIS — M25641 Stiffness of right hand, not elsewhere classified: Secondary | ICD-10-CM | POA: Diagnosis not present

## 2023-04-13 DIAGNOSIS — Z4889 Encounter for other specified surgical aftercare: Secondary | ICD-10-CM | POA: Diagnosis not present

## 2023-04-13 DIAGNOSIS — M25641 Stiffness of right hand, not elsewhere classified: Secondary | ICD-10-CM | POA: Diagnosis not present

## 2023-04-13 DIAGNOSIS — T148XXA Other injury of unspecified body region, initial encounter: Secondary | ICD-10-CM | POA: Diagnosis not present

## 2023-04-20 DIAGNOSIS — T148XXA Other injury of unspecified body region, initial encounter: Secondary | ICD-10-CM | POA: Diagnosis not present

## 2023-04-20 DIAGNOSIS — M25641 Stiffness of right hand, not elsewhere classified: Secondary | ICD-10-CM | POA: Diagnosis not present

## 2023-04-20 DIAGNOSIS — Z4889 Encounter for other specified surgical aftercare: Secondary | ICD-10-CM | POA: Diagnosis not present

## 2023-05-19 ENCOUNTER — Ambulatory Visit: Payer: Medicare PPO | Admitting: Internal Medicine

## 2023-05-19 ENCOUNTER — Encounter: Payer: Self-pay | Admitting: Internal Medicine

## 2023-05-19 ENCOUNTER — Other Ambulatory Visit: Payer: Self-pay

## 2023-05-19 VITALS — BP 134/75 | HR 69 | Resp 16 | Ht 60.0 in | Wt 177.0 lb

## 2023-05-19 DIAGNOSIS — M89542 Osteolysis, left hand: Secondary | ICD-10-CM | POA: Diagnosis not present

## 2023-05-19 DIAGNOSIS — M89541 Osteolysis, right hand: Secondary | ICD-10-CM

## 2023-05-19 DIAGNOSIS — E1122 Type 2 diabetes mellitus with diabetic chronic kidney disease: Secondary | ICD-10-CM | POA: Diagnosis not present

## 2023-05-19 DIAGNOSIS — M8660 Other chronic osteomyelitis, unspecified site: Secondary | ICD-10-CM | POA: Diagnosis not present

## 2023-05-19 DIAGNOSIS — S61209S Unspecified open wound of unspecified finger without damage to nail, sequela: Secondary | ICD-10-CM

## 2023-05-19 DIAGNOSIS — R600 Localized edema: Secondary | ICD-10-CM | POA: Diagnosis not present

## 2023-05-19 DIAGNOSIS — G629 Polyneuropathy, unspecified: Secondary | ICD-10-CM | POA: Diagnosis not present

## 2023-05-19 DIAGNOSIS — N189 Chronic kidney disease, unspecified: Secondary | ICD-10-CM | POA: Diagnosis not present

## 2023-05-19 DIAGNOSIS — I129 Hypertensive chronic kidney disease with stage 1 through stage 4 chronic kidney disease, or unspecified chronic kidney disease: Secondary | ICD-10-CM | POA: Diagnosis not present

## 2023-05-19 DIAGNOSIS — N184 Chronic kidney disease, stage 4 (severe): Secondary | ICD-10-CM | POA: Diagnosis not present

## 2023-05-19 DIAGNOSIS — D631 Anemia in chronic kidney disease: Secondary | ICD-10-CM | POA: Diagnosis not present

## 2023-05-19 DIAGNOSIS — S61204A Unspecified open wound of right ring finger without damage to nail, initial encounter: Secondary | ICD-10-CM

## 2023-05-19 MED ORDER — SILVER SULFADIAZINE 1 % EX CREA: 1.0000 | TOPICAL_CREAM | Freq: Every day | CUTANEOUS | 1 refills | Status: AC

## 2023-05-19 MED ORDER — AMOXICILLIN-POT CLAVULANATE 875-125 MG PO TABS: 1.0000 | ORAL_TABLET | Freq: Two times a day (BID) | ORAL | 0 refills | Status: DC

## 2023-05-19 NOTE — Assessment & Plan Note (Addendum)
 He has new wounds developing on different fingers now of unknown etiology.  Additionally MRI with osteolysis of unknown etiology and has required multiple amputations.  This does seem to resolve the issues but with her developing more wounds the question is if there is an underlying diagnosis.  She does have diabetes.  She does not feel much in her hands due to neuropathy.  I will try Augmentin  again to see if there is any benefit and can consider antifungal treatment if the infection is starting from the nailbed.  However I am also concerned that this is an underlying rheumatologic process such as scleroderma or related and we will have her see a rheumatologist as well.  Also send her to wound care and I have given her Silvadene  to continue with wound care at home. Have her return in about 3 weeks to see how she is doing on the Augmentin  and if no improvement will consider Lamisil orally for antifungal treatment.  I have personally spent 42 minutes involved in face-to-face and non-face-to-face activities for this patient on the day of the visit. Professional time spent includes the following activities: Preparing to see the patient (review of tests), Obtaining and/or reviewing separately obtained history (admission/discharge record), Performing a medically appropriate examination and/or evaluation , Ordering medications/tests/procedures, referring and communicating with other health care professionals, Documenting clinical information in the EMR, Independently interpreting results (not separately reported), Communicating results to the patient/family/caregiver, Counseling and educating the patient/family/caregiver and Care coordination (not separately reported).

## 2023-05-19 NOTE — Progress Notes (Signed)
   Subjective:    Patient ID: Angela Castro, female    DOB: 02-Sep-1940, 83 y.o.   MRN: [id number]  HPI Angela Castro is here for follow-up evaluation of finger infections. She has been followed by us  for osteomyelitis of the right long finger and has completed treatment with Augmentin  for over 6 weeks since her hospitalization in November.  Her middle finger has healed well she completed her antibiotics however is here today because her fourth digit of the right hand is worsening.  She has a dry progressively prolonged scab area of that finger and is followed by hand surgery but no known diagnosis.  This is also happening on a finger of her left hand.  They are questioning if there is a new infection developing and also why this is happening.  Has had no fever.  The wound is dry.   Review of Systems  Constitutional:  Negative for chills and fever.  Gastrointestinal:  Negative for diarrhea.       Objective:   Physical Exam Eyes:     General: No scleral icterus. Pulmonary:     Effort: Pulmonary effort is normal.  Neurological:     Mental Status: She is alert.   SH: no tobacco        Assessment & Plan:

## 2023-05-21 LAB — LAB REPORT - SCANNED: EGFR: 13

## 2023-05-22 DIAGNOSIS — E1140 Type 2 diabetes mellitus with diabetic neuropathy, unspecified: Secondary | ICD-10-CM | POA: Diagnosis not present

## 2023-05-22 DIAGNOSIS — N184 Chronic kidney disease, stage 4 (severe): Secondary | ICD-10-CM | POA: Diagnosis not present

## 2023-05-22 DIAGNOSIS — E113493 Type 2 diabetes mellitus with severe nonproliferative diabetic retinopathy without macular edema, bilateral: Secondary | ICD-10-CM | POA: Diagnosis not present

## 2023-05-22 DIAGNOSIS — I10 Essential (primary) hypertension: Secondary | ICD-10-CM | POA: Diagnosis not present

## 2023-05-22 DIAGNOSIS — D649 Anemia, unspecified: Secondary | ICD-10-CM | POA: Diagnosis not present

## 2023-05-22 DIAGNOSIS — E1122 Type 2 diabetes mellitus with diabetic chronic kidney disease: Secondary | ICD-10-CM | POA: Diagnosis not present

## 2023-05-22 DIAGNOSIS — S68119S Complete traumatic metacarpophalangeal amputation of unspecified finger, sequela: Secondary | ICD-10-CM | POA: Diagnosis not present

## 2023-05-22 DIAGNOSIS — Z89422 Acquired absence of other left toe(s): Secondary | ICD-10-CM | POA: Diagnosis not present

## 2023-05-22 DIAGNOSIS — I130 Hypertensive heart and chronic kidney disease with heart failure and stage 1 through stage 4 chronic kidney disease, or unspecified chronic kidney disease: Secondary | ICD-10-CM | POA: Diagnosis not present

## 2023-05-26 DIAGNOSIS — L989 Disorder of the skin and subcutaneous tissue, unspecified: Secondary | ICD-10-CM | POA: Diagnosis not present

## 2023-05-26 DIAGNOSIS — L98494 Non-pressure chronic ulcer of skin of other sites with necrosis of bone: Secondary | ICD-10-CM | POA: Diagnosis not present

## 2023-06-09 ENCOUNTER — Telehealth: Payer: Self-pay

## 2023-06-09 NOTE — Telephone Encounter (Signed)
Pt's daughter called [facility]) asking if it is possible to send in a refill of the pt's medication. She is to f/u with Dr. Luciana Axe on Friday 01/31, but she has or will run prior to her visit. They wanted to make sure that it is either okay or if that can be sent in.   - Augmentin  Verified daughters contact of [phone]

## 2023-06-10 ENCOUNTER — Other Ambulatory Visit: Payer: Self-pay | Admitting: Internal Medicine

## 2023-06-10 MED ORDER — AMOXICILLIN-POT CLAVULANATE 875-125 MG PO TABS: 1.0000 | ORAL_TABLET | Freq: Two times a day (BID) | ORAL | 1 refills | Status: DC

## 2023-06-10 NOTE — Progress Notes (Signed)
n

## 2023-06-12 ENCOUNTER — Encounter: Payer: Self-pay | Admitting: Internal Medicine

## 2023-06-12 ENCOUNTER — Other Ambulatory Visit: Payer: Self-pay

## 2023-06-12 ENCOUNTER — Ambulatory Visit (INDEPENDENT_AMBULATORY_CARE_PROVIDER_SITE_OTHER): Payer: Medicare PPO | Admitting: Internal Medicine

## 2023-06-12 VITALS — BP 154/84 | HR 68 | Resp 16 | Ht 60.0 in | Wt 154.2 lb

## 2023-06-12 DIAGNOSIS — M8660 Other chronic osteomyelitis, unspecified site: Secondary | ICD-10-CM

## 2023-06-12 DIAGNOSIS — M86141 Other acute osteomyelitis, right hand: Secondary | ICD-10-CM | POA: Diagnosis not present

## 2023-06-12 DIAGNOSIS — M869 Osteomyelitis, unspecified: Secondary | ICD-10-CM

## 2023-06-12 NOTE — Assessment & Plan Note (Addendum)
Much improved this visit on the Augmentin along with the wound care.  Still unclear etiology.  She is trying to get in with dermatology and I have referred her to rheumatology.  In regards to rheumatology, I would consider something like scleroderma since she has had notable thickening of her fingers, osteolysis of multiple bony areas not related to infection and being bilateral in nature.  I will check the ANA today to see if that suggests one way or the other.  Will try to reach out to Dr. Mallie Mussel as well.    I have personally spent 31 minutes involved in face-to-face and non-face-to-face activities for this patient on the day of the visit. Professional time spent includes the following activities: Preparing to see the patient (review of tests), Obtaining and/or reviewing separately obtained history (admission/discharge record), Performing a medically appropriate examination and/or evaluation , Ordering medications/tests/procedures, referring and communicating with other health care professionals, Documenting clinical information in the EMR, Independently interpreting results (not separately reported), Communicating results to the patient/family/caregiver, Counseling and educating the patient/family/caregiver and Care coordination (not separately reported).

## 2023-06-12 NOTE — Progress Notes (Signed)
   Subjective:    Patient ID: Angela Castro, female    DOB: [birth year], 83 y.o.   MRN: [id number]  HPI Kahlan is here for follow-up of osteomyelitis of the fingers. She has had ongoing issues with cellulitis, and ulcerations of her fingers requiring amputations of her fingers.  Etiology of the underlying issue is unknown at this time.  She has had open wounds and I restarted her on Augmentin last visit and she is noted noticeable improvement since starting the Augmentin.  She is also doing wound care as instructed by her hand surgeon.  She has continued thickening of her fingers particularly at the nailbeds remain.  Recent MRI shows osteolysis of multiple digits and marrow edema.     Review of Systems  Constitutional:  Negative for fatigue.  Gastrointestinal:  Negative for diarrhea.  Skin:  Negative for rash.       Objective:   Physical Exam Musculoskeletal:     Comments: See media  Neurological:     Mental Status: She is alert.           Assessment & Plan:

## 2023-06-14 LAB — ANTI-NUCLEAR AB-TITER (ANA TITER): ANA Titer 1: 1:80 {titer} — ABNORMAL HIGH

## 2023-06-14 LAB — ANA: Anti Nuclear Antibody (ANA): POSITIVE — AB

## 2023-06-15 DIAGNOSIS — L98494 Non-pressure chronic ulcer of skin of other sites with necrosis of bone: Secondary | ICD-10-CM | POA: Diagnosis not present

## 2023-06-15 DIAGNOSIS — L989 Disorder of the skin and subcutaneous tissue, unspecified: Secondary | ICD-10-CM | POA: Diagnosis not present

## 2023-07-14 ENCOUNTER — Encounter (HOSPITAL_BASED_OUTPATIENT_CLINIC_OR_DEPARTMENT_OTHER): Payer: Medicare PPO | Admitting: General Surgery

## 2023-07-15 ENCOUNTER — Other Ambulatory Visit: Payer: Self-pay | Admitting: Internal Medicine

## 2023-07-15 MED ORDER — AMOXICILLIN-POT CLAVULANATE 875-125 MG PO TABS: 1.0000 | ORAL_TABLET | Freq: Two times a day (BID) | ORAL | 5 refills | Status: DC

## 2023-07-20 DIAGNOSIS — Z6830 Body mass index (BMI) 30.0-30.9, adult: Secondary | ICD-10-CM | POA: Diagnosis not present

## 2023-07-20 DIAGNOSIS — M868X4 Other osteomyelitis, hand: Secondary | ICD-10-CM | POA: Diagnosis not present

## 2023-07-20 DIAGNOSIS — E669 Obesity, unspecified: Secondary | ICD-10-CM | POA: Diagnosis not present

## 2023-07-20 DIAGNOSIS — M7989 Other specified soft tissue disorders: Secondary | ICD-10-CM | POA: Diagnosis not present

## 2023-07-20 DIAGNOSIS — R768 Other specified abnormal immunological findings in serum: Secondary | ICD-10-CM | POA: Diagnosis not present

## 2023-08-11 DIAGNOSIS — E559 Vitamin D deficiency, unspecified: Secondary | ICD-10-CM | POA: Diagnosis not present

## 2023-08-11 DIAGNOSIS — E1122 Type 2 diabetes mellitus with diabetic chronic kidney disease: Secondary | ICD-10-CM | POA: Diagnosis not present

## 2023-08-11 DIAGNOSIS — Z23 Encounter for immunization: Secondary | ICD-10-CM | POA: Diagnosis not present

## 2023-08-11 DIAGNOSIS — Z7409 Other reduced mobility: Secondary | ICD-10-CM | POA: Diagnosis not present

## 2023-08-11 DIAGNOSIS — R809 Proteinuria, unspecified: Secondary | ICD-10-CM | POA: Diagnosis not present

## 2023-08-11 DIAGNOSIS — R29898 Other symptoms and signs involving the musculoskeletal system: Secondary | ICD-10-CM | POA: Diagnosis not present

## 2023-08-11 DIAGNOSIS — D649 Anemia, unspecified: Secondary | ICD-10-CM | POA: Diagnosis not present

## 2023-08-12 DIAGNOSIS — E1122 Type 2 diabetes mellitus with diabetic chronic kidney disease: Secondary | ICD-10-CM | POA: Diagnosis not present

## 2023-08-12 DIAGNOSIS — R809 Proteinuria, unspecified: Secondary | ICD-10-CM | POA: Diagnosis not present

## 2023-08-20 DIAGNOSIS — N184 Chronic kidney disease, stage 4 (severe): Secondary | ICD-10-CM | POA: Diagnosis not present

## 2023-08-20 DIAGNOSIS — E1122 Type 2 diabetes mellitus with diabetic chronic kidney disease: Secondary | ICD-10-CM | POA: Diagnosis not present

## 2023-08-20 DIAGNOSIS — I130 Hypertensive heart and chronic kidney disease with heart failure and stage 1 through stage 4 chronic kidney disease, or unspecified chronic kidney disease: Secondary | ICD-10-CM | POA: Diagnosis not present

## 2023-08-20 DIAGNOSIS — E785 Hyperlipidemia, unspecified: Secondary | ICD-10-CM | POA: Diagnosis not present

## 2023-08-20 DIAGNOSIS — M109 Gout, unspecified: Secondary | ICD-10-CM | POA: Diagnosis not present

## 2023-08-20 DIAGNOSIS — I5032 Chronic diastolic (congestive) heart failure: Secondary | ICD-10-CM | POA: Diagnosis not present

## 2023-08-20 DIAGNOSIS — E1140 Type 2 diabetes mellitus with diabetic neuropathy, unspecified: Secondary | ICD-10-CM | POA: Diagnosis not present

## 2023-08-20 DIAGNOSIS — E559 Vitamin D deficiency, unspecified: Secondary | ICD-10-CM | POA: Diagnosis not present

## 2023-08-20 DIAGNOSIS — D631 Anemia in chronic kidney disease: Secondary | ICD-10-CM | POA: Diagnosis not present

## 2023-08-25 DIAGNOSIS — N184 Chronic kidney disease, stage 4 (severe): Secondary | ICD-10-CM | POA: Diagnosis not present

## 2023-08-25 DIAGNOSIS — E1122 Type 2 diabetes mellitus with diabetic chronic kidney disease: Secondary | ICD-10-CM | POA: Diagnosis not present

## 2023-08-25 DIAGNOSIS — D631 Anemia in chronic kidney disease: Secondary | ICD-10-CM | POA: Diagnosis not present

## 2023-08-25 DIAGNOSIS — E559 Vitamin D deficiency, unspecified: Secondary | ICD-10-CM | POA: Diagnosis not present

## 2023-08-25 DIAGNOSIS — M109 Gout, unspecified: Secondary | ICD-10-CM | POA: Diagnosis not present

## 2023-08-25 DIAGNOSIS — I130 Hypertensive heart and chronic kidney disease with heart failure and stage 1 through stage 4 chronic kidney disease, or unspecified chronic kidney disease: Secondary | ICD-10-CM | POA: Diagnosis not present

## 2023-08-25 DIAGNOSIS — E1140 Type 2 diabetes mellitus with diabetic neuropathy, unspecified: Secondary | ICD-10-CM | POA: Diagnosis not present

## 2023-08-25 DIAGNOSIS — I5032 Chronic diastolic (congestive) heart failure: Secondary | ICD-10-CM | POA: Diagnosis not present

## 2023-08-25 DIAGNOSIS — E785 Hyperlipidemia, unspecified: Secondary | ICD-10-CM | POA: Diagnosis not present

## 2023-08-27 DIAGNOSIS — E1140 Type 2 diabetes mellitus with diabetic neuropathy, unspecified: Secondary | ICD-10-CM | POA: Diagnosis not present

## 2023-08-27 DIAGNOSIS — E559 Vitamin D deficiency, unspecified: Secondary | ICD-10-CM | POA: Diagnosis not present

## 2023-08-27 DIAGNOSIS — I5032 Chronic diastolic (congestive) heart failure: Secondary | ICD-10-CM | POA: Diagnosis not present

## 2023-08-27 DIAGNOSIS — I130 Hypertensive heart and chronic kidney disease with heart failure and stage 1 through stage 4 chronic kidney disease, or unspecified chronic kidney disease: Secondary | ICD-10-CM | POA: Diagnosis not present

## 2023-08-27 DIAGNOSIS — N184 Chronic kidney disease, stage 4 (severe): Secondary | ICD-10-CM | POA: Diagnosis not present

## 2023-08-27 DIAGNOSIS — D631 Anemia in chronic kidney disease: Secondary | ICD-10-CM | POA: Diagnosis not present

## 2023-08-27 DIAGNOSIS — E785 Hyperlipidemia, unspecified: Secondary | ICD-10-CM | POA: Diagnosis not present

## 2023-08-27 DIAGNOSIS — M109 Gout, unspecified: Secondary | ICD-10-CM | POA: Diagnosis not present

## 2023-08-27 DIAGNOSIS — E1122 Type 2 diabetes mellitus with diabetic chronic kidney disease: Secondary | ICD-10-CM | POA: Diagnosis not present

## 2023-09-01 DIAGNOSIS — N184 Chronic kidney disease, stage 4 (severe): Secondary | ICD-10-CM | POA: Diagnosis not present

## 2023-09-01 DIAGNOSIS — E559 Vitamin D deficiency, unspecified: Secondary | ICD-10-CM | POA: Diagnosis not present

## 2023-09-01 DIAGNOSIS — I130 Hypertensive heart and chronic kidney disease with heart failure and stage 1 through stage 4 chronic kidney disease, or unspecified chronic kidney disease: Secondary | ICD-10-CM | POA: Diagnosis not present

## 2023-09-01 DIAGNOSIS — M109 Gout, unspecified: Secondary | ICD-10-CM | POA: Diagnosis not present

## 2023-09-01 DIAGNOSIS — I5032 Chronic diastolic (congestive) heart failure: Secondary | ICD-10-CM | POA: Diagnosis not present

## 2023-09-01 DIAGNOSIS — E785 Hyperlipidemia, unspecified: Secondary | ICD-10-CM | POA: Diagnosis not present

## 2023-09-01 DIAGNOSIS — E1140 Type 2 diabetes mellitus with diabetic neuropathy, unspecified: Secondary | ICD-10-CM | POA: Diagnosis not present

## 2023-09-01 DIAGNOSIS — D631 Anemia in chronic kidney disease: Secondary | ICD-10-CM | POA: Diagnosis not present

## 2023-09-01 DIAGNOSIS — E1122 Type 2 diabetes mellitus with diabetic chronic kidney disease: Secondary | ICD-10-CM | POA: Diagnosis not present

## 2023-09-03 DIAGNOSIS — L989 Disorder of the skin and subcutaneous tissue, unspecified: Secondary | ICD-10-CM | POA: Diagnosis not present

## 2023-09-03 DIAGNOSIS — G63 Polyneuropathy in diseases classified elsewhere: Secondary | ICD-10-CM | POA: Diagnosis not present

## 2023-09-03 DIAGNOSIS — Q789 Osteochondrodysplasia, unspecified: Secondary | ICD-10-CM | POA: Diagnosis not present

## 2023-09-04 DIAGNOSIS — E1140 Type 2 diabetes mellitus with diabetic neuropathy, unspecified: Secondary | ICD-10-CM | POA: Diagnosis not present

## 2023-09-04 DIAGNOSIS — M109 Gout, unspecified: Secondary | ICD-10-CM | POA: Diagnosis not present

## 2023-09-04 DIAGNOSIS — E785 Hyperlipidemia, unspecified: Secondary | ICD-10-CM | POA: Diagnosis not present

## 2023-09-04 DIAGNOSIS — E559 Vitamin D deficiency, unspecified: Secondary | ICD-10-CM | POA: Diagnosis not present

## 2023-09-04 DIAGNOSIS — I5032 Chronic diastolic (congestive) heart failure: Secondary | ICD-10-CM | POA: Diagnosis not present

## 2023-09-04 DIAGNOSIS — D631 Anemia in chronic kidney disease: Secondary | ICD-10-CM | POA: Diagnosis not present

## 2023-09-04 DIAGNOSIS — I130 Hypertensive heart and chronic kidney disease with heart failure and stage 1 through stage 4 chronic kidney disease, or unspecified chronic kidney disease: Secondary | ICD-10-CM | POA: Diagnosis not present

## 2023-09-04 DIAGNOSIS — N184 Chronic kidney disease, stage 4 (severe): Secondary | ICD-10-CM | POA: Diagnosis not present

## 2023-09-04 DIAGNOSIS — E1122 Type 2 diabetes mellitus with diabetic chronic kidney disease: Secondary | ICD-10-CM | POA: Diagnosis not present

## 2023-09-08 DIAGNOSIS — E559 Vitamin D deficiency, unspecified: Secondary | ICD-10-CM | POA: Diagnosis not present

## 2023-09-08 DIAGNOSIS — E1140 Type 2 diabetes mellitus with diabetic neuropathy, unspecified: Secondary | ICD-10-CM | POA: Diagnosis not present

## 2023-09-08 DIAGNOSIS — I5032 Chronic diastolic (congestive) heart failure: Secondary | ICD-10-CM | POA: Diagnosis not present

## 2023-09-08 DIAGNOSIS — D631 Anemia in chronic kidney disease: Secondary | ICD-10-CM | POA: Diagnosis not present

## 2023-09-08 DIAGNOSIS — E785 Hyperlipidemia, unspecified: Secondary | ICD-10-CM | POA: Diagnosis not present

## 2023-09-08 DIAGNOSIS — N184 Chronic kidney disease, stage 4 (severe): Secondary | ICD-10-CM | POA: Diagnosis not present

## 2023-09-08 DIAGNOSIS — E1122 Type 2 diabetes mellitus with diabetic chronic kidney disease: Secondary | ICD-10-CM | POA: Diagnosis not present

## 2023-09-08 DIAGNOSIS — I130 Hypertensive heart and chronic kidney disease with heart failure and stage 1 through stage 4 chronic kidney disease, or unspecified chronic kidney disease: Secondary | ICD-10-CM | POA: Diagnosis not present

## 2023-09-08 DIAGNOSIS — M109 Gout, unspecified: Secondary | ICD-10-CM | POA: Diagnosis not present

## 2023-09-10 DIAGNOSIS — E1140 Type 2 diabetes mellitus with diabetic neuropathy, unspecified: Secondary | ICD-10-CM | POA: Diagnosis not present

## 2023-09-10 DIAGNOSIS — I5032 Chronic diastolic (congestive) heart failure: Secondary | ICD-10-CM | POA: Diagnosis not present

## 2023-09-10 DIAGNOSIS — D631 Anemia in chronic kidney disease: Secondary | ICD-10-CM | POA: Diagnosis not present

## 2023-09-10 DIAGNOSIS — I130 Hypertensive heart and chronic kidney disease with heart failure and stage 1 through stage 4 chronic kidney disease, or unspecified chronic kidney disease: Secondary | ICD-10-CM | POA: Diagnosis not present

## 2023-09-10 DIAGNOSIS — E785 Hyperlipidemia, unspecified: Secondary | ICD-10-CM | POA: Diagnosis not present

## 2023-09-10 DIAGNOSIS — N184 Chronic kidney disease, stage 4 (severe): Secondary | ICD-10-CM | POA: Diagnosis not present

## 2023-09-10 DIAGNOSIS — E559 Vitamin D deficiency, unspecified: Secondary | ICD-10-CM | POA: Diagnosis not present

## 2023-09-10 DIAGNOSIS — M109 Gout, unspecified: Secondary | ICD-10-CM | POA: Diagnosis not present

## 2023-09-10 DIAGNOSIS — E1122 Type 2 diabetes mellitus with diabetic chronic kidney disease: Secondary | ICD-10-CM | POA: Diagnosis not present

## 2023-09-14 DIAGNOSIS — Q789 Osteochondrodysplasia, unspecified: Secondary | ICD-10-CM | POA: Diagnosis not present

## 2023-09-15 DIAGNOSIS — D631 Anemia in chronic kidney disease: Secondary | ICD-10-CM | POA: Diagnosis not present

## 2023-09-15 DIAGNOSIS — E559 Vitamin D deficiency, unspecified: Secondary | ICD-10-CM | POA: Diagnosis not present

## 2023-09-15 DIAGNOSIS — E785 Hyperlipidemia, unspecified: Secondary | ICD-10-CM | POA: Diagnosis not present

## 2023-09-15 DIAGNOSIS — I130 Hypertensive heart and chronic kidney disease with heart failure and stage 1 through stage 4 chronic kidney disease, or unspecified chronic kidney disease: Secondary | ICD-10-CM | POA: Diagnosis not present

## 2023-09-15 DIAGNOSIS — E1140 Type 2 diabetes mellitus with diabetic neuropathy, unspecified: Secondary | ICD-10-CM | POA: Diagnosis not present

## 2023-09-15 DIAGNOSIS — N184 Chronic kidney disease, stage 4 (severe): Secondary | ICD-10-CM | POA: Diagnosis not present

## 2023-09-15 DIAGNOSIS — I5032 Chronic diastolic (congestive) heart failure: Secondary | ICD-10-CM | POA: Diagnosis not present

## 2023-09-15 DIAGNOSIS — E1122 Type 2 diabetes mellitus with diabetic chronic kidney disease: Secondary | ICD-10-CM | POA: Diagnosis not present

## 2023-09-15 DIAGNOSIS — M109 Gout, unspecified: Secondary | ICD-10-CM | POA: Diagnosis not present

## 2023-09-18 DIAGNOSIS — I130 Hypertensive heart and chronic kidney disease with heart failure and stage 1 through stage 4 chronic kidney disease, or unspecified chronic kidney disease: Secondary | ICD-10-CM | POA: Diagnosis not present

## 2023-09-18 DIAGNOSIS — M109 Gout, unspecified: Secondary | ICD-10-CM | POA: Diagnosis not present

## 2023-09-18 DIAGNOSIS — E1122 Type 2 diabetes mellitus with diabetic chronic kidney disease: Secondary | ICD-10-CM | POA: Diagnosis not present

## 2023-09-18 DIAGNOSIS — N184 Chronic kidney disease, stage 4 (severe): Secondary | ICD-10-CM | POA: Diagnosis not present

## 2023-09-18 DIAGNOSIS — E559 Vitamin D deficiency, unspecified: Secondary | ICD-10-CM | POA: Diagnosis not present

## 2023-09-18 DIAGNOSIS — D631 Anemia in chronic kidney disease: Secondary | ICD-10-CM | POA: Diagnosis not present

## 2023-09-18 DIAGNOSIS — E785 Hyperlipidemia, unspecified: Secondary | ICD-10-CM | POA: Diagnosis not present

## 2023-09-18 DIAGNOSIS — E1140 Type 2 diabetes mellitus with diabetic neuropathy, unspecified: Secondary | ICD-10-CM | POA: Diagnosis not present

## 2023-09-18 DIAGNOSIS — I5032 Chronic diastolic (congestive) heart failure: Secondary | ICD-10-CM | POA: Diagnosis not present

## 2023-09-21 DIAGNOSIS — N184 Chronic kidney disease, stage 4 (severe): Secondary | ICD-10-CM | POA: Diagnosis not present

## 2023-09-21 DIAGNOSIS — M109 Gout, unspecified: Secondary | ICD-10-CM | POA: Diagnosis not present

## 2023-09-21 DIAGNOSIS — I5032 Chronic diastolic (congestive) heart failure: Secondary | ICD-10-CM | POA: Diagnosis not present

## 2023-09-21 DIAGNOSIS — E1140 Type 2 diabetes mellitus with diabetic neuropathy, unspecified: Secondary | ICD-10-CM | POA: Diagnosis not present

## 2023-09-21 DIAGNOSIS — I130 Hypertensive heart and chronic kidney disease with heart failure and stage 1 through stage 4 chronic kidney disease, or unspecified chronic kidney disease: Secondary | ICD-10-CM | POA: Diagnosis not present

## 2023-09-21 DIAGNOSIS — E1122 Type 2 diabetes mellitus with diabetic chronic kidney disease: Secondary | ICD-10-CM | POA: Diagnosis not present

## 2023-09-21 DIAGNOSIS — E559 Vitamin D deficiency, unspecified: Secondary | ICD-10-CM | POA: Diagnosis not present

## 2023-09-21 DIAGNOSIS — D631 Anemia in chronic kidney disease: Secondary | ICD-10-CM | POA: Diagnosis not present

## 2023-09-21 DIAGNOSIS — E785 Hyperlipidemia, unspecified: Secondary | ICD-10-CM | POA: Diagnosis not present

## 2023-09-22 DIAGNOSIS — I129 Hypertensive chronic kidney disease with stage 1 through stage 4 chronic kidney disease, or unspecified chronic kidney disease: Secondary | ICD-10-CM | POA: Diagnosis not present

## 2023-09-22 DIAGNOSIS — E1122 Type 2 diabetes mellitus with diabetic chronic kidney disease: Secondary | ICD-10-CM | POA: Diagnosis not present

## 2023-09-22 DIAGNOSIS — R600 Localized edema: Secondary | ICD-10-CM | POA: Diagnosis not present

## 2023-09-22 DIAGNOSIS — N184 Chronic kidney disease, stage 4 (severe): Secondary | ICD-10-CM | POA: Diagnosis not present

## 2023-09-23 DIAGNOSIS — N184 Chronic kidney disease, stage 4 (severe): Secondary | ICD-10-CM | POA: Diagnosis not present

## 2023-09-24 DIAGNOSIS — E1122 Type 2 diabetes mellitus with diabetic chronic kidney disease: Secondary | ICD-10-CM | POA: Diagnosis not present

## 2023-09-24 DIAGNOSIS — I130 Hypertensive heart and chronic kidney disease with heart failure and stage 1 through stage 4 chronic kidney disease, or unspecified chronic kidney disease: Secondary | ICD-10-CM | POA: Diagnosis not present

## 2023-09-24 DIAGNOSIS — E559 Vitamin D deficiency, unspecified: Secondary | ICD-10-CM | POA: Diagnosis not present

## 2023-09-24 DIAGNOSIS — I5032 Chronic diastolic (congestive) heart failure: Secondary | ICD-10-CM | POA: Diagnosis not present

## 2023-09-24 DIAGNOSIS — N184 Chronic kidney disease, stage 4 (severe): Secondary | ICD-10-CM | POA: Diagnosis not present

## 2023-09-24 DIAGNOSIS — E785 Hyperlipidemia, unspecified: Secondary | ICD-10-CM | POA: Diagnosis not present

## 2023-09-24 DIAGNOSIS — M109 Gout, unspecified: Secondary | ICD-10-CM | POA: Diagnosis not present

## 2023-09-24 DIAGNOSIS — D631 Anemia in chronic kidney disease: Secondary | ICD-10-CM | POA: Diagnosis not present

## 2023-09-24 DIAGNOSIS — E1140 Type 2 diabetes mellitus with diabetic neuropathy, unspecified: Secondary | ICD-10-CM | POA: Diagnosis not present

## 2023-09-28 DIAGNOSIS — E785 Hyperlipidemia, unspecified: Secondary | ICD-10-CM | POA: Diagnosis not present

## 2023-09-28 DIAGNOSIS — E1140 Type 2 diabetes mellitus with diabetic neuropathy, unspecified: Secondary | ICD-10-CM | POA: Diagnosis not present

## 2023-09-28 DIAGNOSIS — D631 Anemia in chronic kidney disease: Secondary | ICD-10-CM | POA: Diagnosis not present

## 2023-09-28 DIAGNOSIS — M109 Gout, unspecified: Secondary | ICD-10-CM | POA: Diagnosis not present

## 2023-09-28 DIAGNOSIS — I130 Hypertensive heart and chronic kidney disease with heart failure and stage 1 through stage 4 chronic kidney disease, or unspecified chronic kidney disease: Secondary | ICD-10-CM | POA: Diagnosis not present

## 2023-09-28 DIAGNOSIS — E1122 Type 2 diabetes mellitus with diabetic chronic kidney disease: Secondary | ICD-10-CM | POA: Diagnosis not present

## 2023-09-28 DIAGNOSIS — I5032 Chronic diastolic (congestive) heart failure: Secondary | ICD-10-CM | POA: Diagnosis not present

## 2023-09-28 DIAGNOSIS — N184 Chronic kidney disease, stage 4 (severe): Secondary | ICD-10-CM | POA: Diagnosis not present

## 2023-09-28 DIAGNOSIS — E559 Vitamin D deficiency, unspecified: Secondary | ICD-10-CM | POA: Diagnosis not present

## 2023-10-01 DIAGNOSIS — E1122 Type 2 diabetes mellitus with diabetic chronic kidney disease: Secondary | ICD-10-CM | POA: Diagnosis not present

## 2023-10-01 DIAGNOSIS — I130 Hypertensive heart and chronic kidney disease with heart failure and stage 1 through stage 4 chronic kidney disease, or unspecified chronic kidney disease: Secondary | ICD-10-CM | POA: Diagnosis not present

## 2023-10-01 DIAGNOSIS — M109 Gout, unspecified: Secondary | ICD-10-CM | POA: Diagnosis not present

## 2023-10-01 DIAGNOSIS — N184 Chronic kidney disease, stage 4 (severe): Secondary | ICD-10-CM | POA: Diagnosis not present

## 2023-10-01 DIAGNOSIS — E559 Vitamin D deficiency, unspecified: Secondary | ICD-10-CM | POA: Diagnosis not present

## 2023-10-01 DIAGNOSIS — E785 Hyperlipidemia, unspecified: Secondary | ICD-10-CM | POA: Diagnosis not present

## 2023-10-01 DIAGNOSIS — D631 Anemia in chronic kidney disease: Secondary | ICD-10-CM | POA: Diagnosis not present

## 2023-10-01 DIAGNOSIS — I5032 Chronic diastolic (congestive) heart failure: Secondary | ICD-10-CM | POA: Diagnosis not present

## 2023-10-01 DIAGNOSIS — E1140 Type 2 diabetes mellitus with diabetic neuropathy, unspecified: Secondary | ICD-10-CM | POA: Diagnosis not present

## 2023-10-07 DIAGNOSIS — E1140 Type 2 diabetes mellitus with diabetic neuropathy, unspecified: Secondary | ICD-10-CM | POA: Diagnosis not present

## 2023-10-07 DIAGNOSIS — E1122 Type 2 diabetes mellitus with diabetic chronic kidney disease: Secondary | ICD-10-CM | POA: Diagnosis not present

## 2023-10-07 DIAGNOSIS — I5032 Chronic diastolic (congestive) heart failure: Secondary | ICD-10-CM | POA: Diagnosis not present

## 2023-10-07 DIAGNOSIS — E559 Vitamin D deficiency, unspecified: Secondary | ICD-10-CM | POA: Diagnosis not present

## 2023-10-07 DIAGNOSIS — M109 Gout, unspecified: Secondary | ICD-10-CM | POA: Diagnosis not present

## 2023-10-07 DIAGNOSIS — E785 Hyperlipidemia, unspecified: Secondary | ICD-10-CM | POA: Diagnosis not present

## 2023-10-07 DIAGNOSIS — N184 Chronic kidney disease, stage 4 (severe): Secondary | ICD-10-CM | POA: Diagnosis not present

## 2023-10-07 DIAGNOSIS — I130 Hypertensive heart and chronic kidney disease with heart failure and stage 1 through stage 4 chronic kidney disease, or unspecified chronic kidney disease: Secondary | ICD-10-CM | POA: Diagnosis not present

## 2023-10-07 DIAGNOSIS — D631 Anemia in chronic kidney disease: Secondary | ICD-10-CM | POA: Diagnosis not present

## 2023-10-09 DIAGNOSIS — I130 Hypertensive heart and chronic kidney disease with heart failure and stage 1 through stage 4 chronic kidney disease, or unspecified chronic kidney disease: Secondary | ICD-10-CM | POA: Diagnosis not present

## 2023-10-09 DIAGNOSIS — N184 Chronic kidney disease, stage 4 (severe): Secondary | ICD-10-CM | POA: Diagnosis not present

## 2023-10-09 DIAGNOSIS — E1140 Type 2 diabetes mellitus with diabetic neuropathy, unspecified: Secondary | ICD-10-CM | POA: Diagnosis not present

## 2023-10-09 DIAGNOSIS — I5032 Chronic diastolic (congestive) heart failure: Secondary | ICD-10-CM | POA: Diagnosis not present

## 2023-10-09 DIAGNOSIS — E785 Hyperlipidemia, unspecified: Secondary | ICD-10-CM | POA: Diagnosis not present

## 2023-10-09 DIAGNOSIS — D631 Anemia in chronic kidney disease: Secondary | ICD-10-CM | POA: Diagnosis not present

## 2023-10-09 DIAGNOSIS — M109 Gout, unspecified: Secondary | ICD-10-CM | POA: Diagnosis not present

## 2023-10-09 DIAGNOSIS — E1122 Type 2 diabetes mellitus with diabetic chronic kidney disease: Secondary | ICD-10-CM | POA: Diagnosis not present

## 2023-10-09 DIAGNOSIS — E559 Vitamin D deficiency, unspecified: Secondary | ICD-10-CM | POA: Diagnosis not present

## 2023-10-12 DIAGNOSIS — N184 Chronic kidney disease, stage 4 (severe): Secondary | ICD-10-CM | POA: Diagnosis not present

## 2023-10-12 DIAGNOSIS — E785 Hyperlipidemia, unspecified: Secondary | ICD-10-CM | POA: Diagnosis not present

## 2023-10-12 DIAGNOSIS — E559 Vitamin D deficiency, unspecified: Secondary | ICD-10-CM | POA: Diagnosis not present

## 2023-10-12 DIAGNOSIS — E1122 Type 2 diabetes mellitus with diabetic chronic kidney disease: Secondary | ICD-10-CM | POA: Diagnosis not present

## 2023-10-12 DIAGNOSIS — D631 Anemia in chronic kidney disease: Secondary | ICD-10-CM | POA: Diagnosis not present

## 2023-10-12 DIAGNOSIS — I5032 Chronic diastolic (congestive) heart failure: Secondary | ICD-10-CM | POA: Diagnosis not present

## 2023-10-12 DIAGNOSIS — I130 Hypertensive heart and chronic kidney disease with heart failure and stage 1 through stage 4 chronic kidney disease, or unspecified chronic kidney disease: Secondary | ICD-10-CM | POA: Diagnosis not present

## 2023-10-12 DIAGNOSIS — E1140 Type 2 diabetes mellitus with diabetic neuropathy, unspecified: Secondary | ICD-10-CM | POA: Diagnosis not present

## 2023-10-12 DIAGNOSIS — M109 Gout, unspecified: Secondary | ICD-10-CM | POA: Diagnosis not present

## 2023-10-14 DIAGNOSIS — E559 Vitamin D deficiency, unspecified: Secondary | ICD-10-CM | POA: Diagnosis not present

## 2023-10-14 DIAGNOSIS — I5032 Chronic diastolic (congestive) heart failure: Secondary | ICD-10-CM | POA: Diagnosis not present

## 2023-10-14 DIAGNOSIS — M109 Gout, unspecified: Secondary | ICD-10-CM | POA: Diagnosis not present

## 2023-10-14 DIAGNOSIS — E1140 Type 2 diabetes mellitus with diabetic neuropathy, unspecified: Secondary | ICD-10-CM | POA: Diagnosis not present

## 2023-10-14 DIAGNOSIS — I130 Hypertensive heart and chronic kidney disease with heart failure and stage 1 through stage 4 chronic kidney disease, or unspecified chronic kidney disease: Secondary | ICD-10-CM | POA: Diagnosis not present

## 2023-10-14 DIAGNOSIS — N184 Chronic kidney disease, stage 4 (severe): Secondary | ICD-10-CM | POA: Diagnosis not present

## 2023-10-14 DIAGNOSIS — D631 Anemia in chronic kidney disease: Secondary | ICD-10-CM | POA: Diagnosis not present

## 2023-10-14 DIAGNOSIS — E1122 Type 2 diabetes mellitus with diabetic chronic kidney disease: Secondary | ICD-10-CM | POA: Diagnosis not present

## 2023-10-14 DIAGNOSIS — E785 Hyperlipidemia, unspecified: Secondary | ICD-10-CM | POA: Diagnosis not present

## 2023-11-24 DIAGNOSIS — E1122 Type 2 diabetes mellitus with diabetic chronic kidney disease: Secondary | ICD-10-CM | POA: Diagnosis not present

## 2023-11-24 DIAGNOSIS — R0981 Nasal congestion: Secondary | ICD-10-CM | POA: Diagnosis not present

## 2023-11-24 DIAGNOSIS — G629 Polyneuropathy, unspecified: Secondary | ICD-10-CM | POA: Diagnosis not present

## 2023-11-24 DIAGNOSIS — Z7409 Other reduced mobility: Secondary | ICD-10-CM | POA: Diagnosis not present

## 2023-11-24 DIAGNOSIS — R29898 Other symptoms and signs involving the musculoskeletal system: Secondary | ICD-10-CM | POA: Diagnosis not present

## 2023-11-24 DIAGNOSIS — E559 Vitamin D deficiency, unspecified: Secondary | ICD-10-CM | POA: Diagnosis not present

## 2023-11-24 DIAGNOSIS — D649 Anemia, unspecified: Secondary | ICD-10-CM | POA: Diagnosis not present

## 2023-11-24 DIAGNOSIS — R809 Proteinuria, unspecified: Secondary | ICD-10-CM | POA: Diagnosis not present

## 2023-12-29 DIAGNOSIS — E113493 Type 2 diabetes mellitus with severe nonproliferative diabetic retinopathy without macular edema, bilateral: Secondary | ICD-10-CM | POA: Diagnosis not present

## 2023-12-29 DIAGNOSIS — H40023 Open angle with borderline findings, high risk, bilateral: Secondary | ICD-10-CM | POA: Diagnosis not present

## 2023-12-29 DIAGNOSIS — Z961 Presence of intraocular lens: Secondary | ICD-10-CM | POA: Diagnosis not present

## 2023-12-29 DIAGNOSIS — H43813 Vitreous degeneration, bilateral: Secondary | ICD-10-CM | POA: Diagnosis not present

## 2024-01-11 DIAGNOSIS — H6123 Impacted cerumen, bilateral: Secondary | ICD-10-CM | POA: Diagnosis not present

## 2024-01-11 DIAGNOSIS — J329 Chronic sinusitis, unspecified: Secondary | ICD-10-CM | POA: Diagnosis not present

## 2024-01-11 DIAGNOSIS — J029 Acute pharyngitis, unspecified: Secondary | ICD-10-CM | POA: Diagnosis not present

## 2024-01-11 DIAGNOSIS — J3489 Other specified disorders of nose and nasal sinuses: Secondary | ICD-10-CM | POA: Diagnosis not present

## 2024-03-22 DIAGNOSIS — I10 Essential (primary) hypertension: Secondary | ICD-10-CM | POA: Diagnosis not present

## 2024-03-22 DIAGNOSIS — E1121 Type 2 diabetes mellitus with diabetic nephropathy: Secondary | ICD-10-CM | POA: Diagnosis not present

## 2024-03-22 DIAGNOSIS — E1140 Type 2 diabetes mellitus with diabetic neuropathy, unspecified: Secondary | ICD-10-CM | POA: Diagnosis not present

## 2024-03-22 DIAGNOSIS — R609 Edema, unspecified: Secondary | ICD-10-CM | POA: Diagnosis not present

## 2024-03-22 DIAGNOSIS — D649 Anemia, unspecified: Secondary | ICD-10-CM | POA: Diagnosis not present

## 2024-03-22 DIAGNOSIS — Z0000 Encounter for general adult medical examination without abnormal findings: Secondary | ICD-10-CM | POA: Diagnosis not present

## 2024-03-22 DIAGNOSIS — Z23 Encounter for immunization: Secondary | ICD-10-CM | POA: Diagnosis not present

## 2024-03-22 DIAGNOSIS — E785 Hyperlipidemia, unspecified: Secondary | ICD-10-CM | POA: Diagnosis not present

## 2024-03-22 DIAGNOSIS — N184 Chronic kidney disease, stage 4 (severe): Secondary | ICD-10-CM | POA: Diagnosis not present

## 2024-03-22 DIAGNOSIS — E559 Vitamin D deficiency, unspecified: Secondary | ICD-10-CM | POA: Diagnosis not present

## 2024-05-22 ENCOUNTER — Other Ambulatory Visit: Payer: Self-pay

## 2024-05-22 ENCOUNTER — Inpatient Hospital Stay (HOSPITAL_COMMUNITY)
Admission: EM | Admit: 2024-05-22 | Discharge: 2024-05-25 | DRG: 641 | Disposition: A | Discharge status: Hospice | Attending: Student | Admitting: Student

## 2024-05-22 ENCOUNTER — Encounter (HOSPITAL_COMMUNITY): Payer: Self-pay

## 2024-05-22 DIAGNOSIS — N185 Chronic kidney disease, stage 5: Secondary | ICD-10-CM | POA: Diagnosis present

## 2024-05-22 DIAGNOSIS — Z602 Problems related to living alone: Secondary | ICD-10-CM | POA: Diagnosis present

## 2024-05-22 DIAGNOSIS — N183 Chronic kidney disease, stage 3 unspecified: Secondary | ICD-10-CM | POA: Diagnosis present

## 2024-05-22 DIAGNOSIS — R531 Weakness: Secondary | ICD-10-CM

## 2024-05-22 DIAGNOSIS — Z7189 Other specified counseling: Secondary | ICD-10-CM

## 2024-05-22 DIAGNOSIS — Z79899 Other long term (current) drug therapy: Secondary | ICD-10-CM | POA: Diagnosis not present

## 2024-05-22 DIAGNOSIS — F32A Depression, unspecified: Secondary | ICD-10-CM | POA: Diagnosis present

## 2024-05-22 DIAGNOSIS — I5032 Chronic diastolic (congestive) heart failure: Secondary | ICD-10-CM | POA: Diagnosis present

## 2024-05-22 DIAGNOSIS — Z7984 Long term (current) use of oral hypoglycemic drugs: Secondary | ICD-10-CM | POA: Diagnosis not present

## 2024-05-22 DIAGNOSIS — E875 Hyperkalemia: Secondary | ICD-10-CM | POA: Diagnosis present

## 2024-05-22 DIAGNOSIS — E1122 Type 2 diabetes mellitus with diabetic chronic kidney disease: Secondary | ICD-10-CM | POA: Diagnosis present

## 2024-05-22 DIAGNOSIS — E1142 Type 2 diabetes mellitus with diabetic polyneuropathy: Secondary | ICD-10-CM | POA: Diagnosis present

## 2024-05-22 DIAGNOSIS — Z881 Allergy status to other antibiotic agents status: Secondary | ICD-10-CM | POA: Diagnosis not present

## 2024-05-22 DIAGNOSIS — N184 Chronic kidney disease, stage 4 (severe): Secondary | ICD-10-CM | POA: Diagnosis present

## 2024-05-22 DIAGNOSIS — E119 Type 2 diabetes mellitus without complications: Secondary | ICD-10-CM

## 2024-05-22 DIAGNOSIS — Z66 Do not resuscitate: Secondary | ICD-10-CM | POA: Diagnosis present

## 2024-05-22 DIAGNOSIS — E785 Hyperlipidemia, unspecified: Secondary | ICD-10-CM | POA: Diagnosis present

## 2024-05-22 DIAGNOSIS — I5030 Unspecified diastolic (congestive) heart failure: Secondary | ICD-10-CM | POA: Diagnosis not present

## 2024-05-22 DIAGNOSIS — E872 Acidosis, unspecified: Secondary | ICD-10-CM | POA: Diagnosis present

## 2024-05-22 DIAGNOSIS — Z7982 Long term (current) use of aspirin: Secondary | ICD-10-CM

## 2024-05-22 DIAGNOSIS — I10 Essential (primary) hypertension: Secondary | ICD-10-CM | POA: Diagnosis present

## 2024-05-22 DIAGNOSIS — Z888 Allergy status to other drugs, medicaments and biological substances status: Secondary | ICD-10-CM | POA: Diagnosis not present

## 2024-05-22 DIAGNOSIS — N179 Acute kidney failure, unspecified: Secondary | ICD-10-CM | POA: Diagnosis present

## 2024-05-22 DIAGNOSIS — D631 Anemia in chronic kidney disease: Secondary | ICD-10-CM | POA: Diagnosis present

## 2024-05-22 DIAGNOSIS — L89892 Pressure ulcer of other site, stage 2: Secondary | ICD-10-CM | POA: Diagnosis present

## 2024-05-22 DIAGNOSIS — E8729 Other acidosis: Secondary | ICD-10-CM | POA: Diagnosis not present

## 2024-05-22 DIAGNOSIS — Z515 Encounter for palliative care: Secondary | ICD-10-CM

## 2024-05-22 DIAGNOSIS — R4589 Other symptoms and signs involving emotional state: Secondary | ICD-10-CM

## 2024-05-22 DIAGNOSIS — I132 Hypertensive heart and chronic kidney disease with heart failure and with stage 5 chronic kidney disease, or end stage renal disease: Secondary | ICD-10-CM | POA: Diagnosis present

## 2024-05-22 DIAGNOSIS — Z882 Allergy status to sulfonamides status: Secondary | ICD-10-CM | POA: Diagnosis not present

## 2024-05-22 DIAGNOSIS — Z8249 Family history of ischemic heart disease and other diseases of the circulatory system: Secondary | ICD-10-CM | POA: Diagnosis not present

## 2024-05-22 DIAGNOSIS — Z794 Long term (current) use of insulin: Secondary | ICD-10-CM

## 2024-05-22 DIAGNOSIS — M10379 Gout due to renal impairment, unspecified ankle and foot: Secondary | ICD-10-CM | POA: Diagnosis not present

## 2024-05-22 DIAGNOSIS — M109 Gout, unspecified: Secondary | ICD-10-CM | POA: Diagnosis present

## 2024-05-22 LAB — CBC
HCT: 30.1 % — ABNORMAL LOW (ref 36.0–46.0)
Hemoglobin: 10 g/dL — ABNORMAL LOW (ref 12.0–15.0)
MCH: 31.1 pg (ref 26.0–34.0)
MCHC: 33.2 g/dL (ref 30.0–36.0)
MCV: 93.5 fL (ref 80.0–100.0)
Platelets: 305 K/uL (ref 150–400)
RBC: 3.22 MIL/uL — ABNORMAL LOW (ref 3.87–5.11)
RDW: 18.6 % — ABNORMAL HIGH (ref 11.5–15.5)
WBC: 10.1 K/uL (ref 4.0–10.5)
nRBC: 0.6 % — ABNORMAL HIGH (ref 0.0–0.2)

## 2024-05-22 LAB — COMPREHENSIVE METABOLIC PANEL WITH GFR
ALT: 22 U/L (ref 0–44)
AST: 23 U/L (ref 15–41)
Albumin: 3.2 g/dL — ABNORMAL LOW (ref 3.5–5.0)
Alkaline Phosphatase: 131 U/L — ABNORMAL HIGH (ref 38–126)
Anion gap: 15 (ref 5–15)
BUN: 80 mg/dL — ABNORMAL HIGH (ref 8–23)
CO2: 16 mmol/L — ABNORMAL LOW (ref 22–32)
Calcium: 9.8 mg/dL (ref 8.9–10.3)
Chloride: 112 mmol/L — ABNORMAL HIGH (ref 98–111)
Creatinine, Ser: 5.5 mg/dL — ABNORMAL HIGH (ref 0.44–1.00)
GFR, Estimated: 7 mL/min — ABNORMAL LOW
Glucose, Bld: 169 mg/dL — ABNORMAL HIGH (ref 70–99)
Potassium: 6.6 mmol/L (ref 3.5–5.1)
Sodium: 143 mmol/L (ref 135–145)
Total Bilirubin: 0.2 mg/dL (ref 0.0–1.2)
Total Protein: 6.8 g/dL (ref 6.5–8.1)

## 2024-05-22 LAB — CBG MONITORING, ED: Glucose-Capillary: 155 mg/dL — ABNORMAL HIGH (ref 70–99)

## 2024-05-22 MED ORDER — LORATADINE 10 MG PO TABS
10.0000 mg | ORAL_TABLET | Freq: Every morning | ORAL | Status: DC
  Administered 2024-05-23 – 2024-05-25 (×3): 10 mg via ORAL
  Filled 2024-05-22 (×3): qty 1

## 2024-05-22 MED ORDER — CALCIUM GLUCONATE 10 % IV SOLN
1.0000 g | Freq: Once | INTRAVENOUS | Status: AC
  Administered 2024-05-22: 1 g via INTRAVENOUS
  Filled 2024-05-22: qty 10

## 2024-05-22 MED ORDER — SODIUM CHLORIDE 0.9% FLUSH: 3.0000 mL | INTRAVENOUS | Status: DC | PRN

## 2024-05-22 MED ORDER — OYSTER SHELL CALCIUM/D3 500-5 MG-MCG PO TABS
1.0000 | ORAL_TABLET | Freq: Every morning | ORAL | Status: DC
  Administered 2024-05-23 – 2024-05-25 (×3): 1 via ORAL
  Filled 2024-05-22 (×3): qty 1

## 2024-05-22 MED ORDER — HEPARIN SODIUM (PORCINE) 5000 UNIT/ML IJ SOLN
5000.0000 [IU] | Freq: Three times a day (TID) | INTRAMUSCULAR | Status: DC
  Administered 2024-05-23 – 2024-05-25 (×7): 5000 [IU] via SUBCUTANEOUS
  Filled 2024-05-22 (×7): qty 1

## 2024-05-22 MED ORDER — SODIUM BICARBONATE 650 MG PO TABS
650.0000 mg | ORAL_TABLET | Freq: Three times a day (TID) | ORAL | Status: DC
  Administered 2024-05-22 – 2024-05-25 (×8): 650 mg via ORAL
  Filled 2024-05-22 (×10): qty 1

## 2024-05-22 MED ORDER — AMLODIPINE BESYLATE 5 MG PO TABS: 5.0000 mg | ORAL_TABLET | Freq: Every morning | ORAL | Status: DC

## 2024-05-22 MED ORDER — ONDANSETRON HCL 4 MG/2ML IJ SOLN: 4.0000 mg | Freq: Four times a day (QID) | INTRAMUSCULAR | Status: DC | PRN

## 2024-05-22 MED ORDER — TRAMADOL HCL 50 MG PO TABS
50.0000 mg | ORAL_TABLET | Freq: Two times a day (BID) | ORAL | Status: DC | PRN
  Administered 2024-05-23: 50 mg via ORAL
  Filled 2024-05-22: qty 1

## 2024-05-22 MED ORDER — ONDANSETRON HCL 4 MG PO TABS: 4.0000 mg | ORAL_TABLET | Freq: Four times a day (QID) | ORAL | Status: DC | PRN

## 2024-05-22 MED ORDER — VITAMIN C 500 MG PO TABS
500.0000 mg | ORAL_TABLET | Freq: Every morning | ORAL | Status: DC
  Administered 2024-05-23 – 2024-05-25 (×3): 500 mg via ORAL
  Filled 2024-05-22 (×3): qty 1

## 2024-05-22 MED ORDER — DEXTROSE 50 % IV SOLN
1.0000 | Freq: Once | INTRAVENOUS | Status: AC
  Administered 2024-05-22: 50 mL via INTRAVENOUS
  Filled 2024-05-22: qty 50

## 2024-05-22 MED ORDER — SODIUM BICARBONATE 8.4 % IV SOLN
50.0000 meq | Freq: Once | INTRAVENOUS | Status: AC
  Administered 2024-05-22: 50 meq via INTRAVENOUS
  Filled 2024-05-22: qty 50

## 2024-05-22 MED ORDER — SODIUM CHLORIDE 0.9 % IV SOLN: 250.0000 mL | INTRAVENOUS | Status: AC | PRN

## 2024-05-22 MED ORDER — INSULIN ASPART 100 UNIT/ML IJ SOLN
0.0000 [IU] | Freq: Three times a day (TID) | INTRAMUSCULAR | Status: DC
  Administered 2024-05-23: 1 [IU] via SUBCUTANEOUS
  Administered 2024-05-23 – 2024-05-24 (×3): 2 [IU] via SUBCUTANEOUS
  Filled 2024-05-22: qty 1
  Filled 2024-05-22 (×3): qty 2

## 2024-05-22 MED ORDER — POLYETHYLENE GLYCOL 3350 17 G PO PACK: 17.0000 g | PACK | Freq: Every day | ORAL | Status: DC | PRN

## 2024-05-22 MED ORDER — SODIUM CHLORIDE 0.9% FLUSH
3.0000 mL | Freq: Two times a day (BID) | INTRAVENOUS | Status: DC
  Administered 2024-05-22 – 2024-05-24 (×5): 3 mL via INTRAVENOUS

## 2024-05-22 MED ORDER — ALLOPURINOL 100 MG PO TABS: 50.0000 mg | ORAL_TABLET | ORAL | Status: DC

## 2024-05-22 MED ORDER — INSULIN ASPART 100 UNIT/ML IV SOLN
10.0000 [IU] | Freq: Once | INTRAVENOUS | Status: AC
  Administered 2024-05-22: 10 [IU] via INTRAVENOUS
  Filled 2024-05-22: qty 10

## 2024-05-22 MED ORDER — INSULIN ASPART 100 UNIT/ML IJ SOLN
INTRAMUSCULAR | Status: AC
  Filled 2024-05-22: qty 9

## 2024-05-22 MED ORDER — METOPROLOL TARTRATE 5 MG/5ML IV SOLN: 5.0000 mg | INTRAVENOUS | Status: DC | PRN

## 2024-05-22 MED ORDER — SODIUM ZIRCONIUM CYCLOSILICATE 10 G PO PACK
10.0000 g | PACK | Freq: Once | ORAL | Status: AC
  Administered 2024-05-22: 10 g via ORAL
  Filled 2024-05-22: qty 1

## 2024-05-22 MED ORDER — PREGABALIN 25 MG PO CAPS
25.0000 mg | ORAL_CAPSULE | Freq: Every day | ORAL | Status: DC
  Administered 2024-05-23: 25 mg via ORAL
  Filled 2024-05-22: qty 1

## 2024-05-22 MED ORDER — FERROUS SULFATE 325 (65 FE) MG PO TABS
325.0000 mg | ORAL_TABLET | Freq: Two times a day (BID) | ORAL | Status: DC
  Administered 2024-05-23 – 2024-05-25 (×5): 325 mg via ORAL
  Filled 2024-05-22 (×5): qty 1

## 2024-05-22 MED ORDER — TORSEMIDE 20 MG PO TABS: 40.0000 mg | ORAL_TABLET | Freq: Every day | ORAL | Status: DC

## 2024-05-22 MED ORDER — SIMVASTATIN 10 MG PO TABS
5.0000 mg | ORAL_TABLET | Freq: Every day | ORAL | Status: DC
  Administered 2024-05-22 – 2024-05-24 (×3): 5 mg via ORAL
  Filled 2024-05-22 (×3): qty 1

## 2024-05-22 MED ORDER — BISOPROLOL FUMARATE 5 MG PO TABS
2.5000 mg | ORAL_TABLET | Freq: Every day | ORAL | Status: DC
  Filled 2024-05-22: qty 0.5

## 2024-05-22 NOTE — H&P (Signed)
 " History and Physical    Patient: Angela Castro FMW:[id number] DOB: [birth year] DOA: 05/22/2024 DOS: the patient was seen and examined on 05/22/2024 PCP: Kip Righter, MD  Patient coming from: Home  Chief Complaint:  Chief Complaint  Patient presents with   Weakness   HPI: Angela Castro is a 85 y.o. female with medical history significant of T2DM, HTN, CHF, IDA, gout, peripheral neuropathy with loss of multiple fingers due to infection who has CKD 4 and has seen nephrology in the past but has no longer seeing them because she does not want to undergo dialysis.  She has noticed increased weakness and inability to walk over the last week.  Today she was quite unable to get up or down by herself.  She was brought in by EMS to the ED where she was found to have significant worsening of her renal function and hyperkalemia.  An EKG did not show significant changes she was given sodium bicarbonate , insulin  plus glucose, Lokelma , and calcium  gluconate.  They are asked to admit.  Review of Systems: As mentioned in the history of present illness. All other systems reviewed and are negative. Past Medical History:  Diagnosis Date   Asthma    Diabetes mellitus without complication (HCC)    Hypertension    Vaccine counseling 03/17/2023   Past Surgical History:  Procedure Laterality Date   AMPUTATION Left 08/26/2016   Procedure: AMPUTATION RAY/1ST;  Surgeon: Norleen Armor, MD;  Location: [facility] OR;  Service: Orthopedics;  Laterality: Left;   I & D EXTREMITY Right 02/20/2023   Procedure: IRRIGATION AND DEBRIDEMENT vs. AMPUTATION RIGHT LONG FINGER;  Surgeon: Delene Marsa HERO, MD;  Location: [facility] OR;  Service: Orthopedics;  Laterality: Right;   IR GENERIC HISTORICAL  07/30/2016   IR US  GUIDE VASC ACCESS RIGHT 07/30/2016 Juliene Balder, MD [facility]   IR GENERIC HISTORICAL  07/30/2016   IR FLUORO GUIDE CV MIDLINE PICC RIGHT 07/30/2016 Juliene Balder, MD [facility]   KNEE ARTHROSCOPY     x 4   ORIF FEMUR  FRACTURE Right 03/25/2021   Procedure: OPEN REDUCTION INTERNAL FIXATION (ORIF) DISTAL FEMUR FRACTURE;  Surgeon: Kendal Franky SQUIBB, MD;  Location: [facility] OR;  Service: Orthopedics;  Laterality: Right;   REPLACEMENT TOTAL KNEE     Social History:  reports that she has never smoked. She has never used smokeless tobacco. She reports that she does not drink alcohol and does not use drugs.  Allergies[1]  Family History  Problem Relation Age of Onset   Heart disease Mother     Prior to Admission medications  Medication Sig Start Date End Date Taking? Authorizing Provider  allopurinol  (ZYLOPRIM ) 100 MG tablet Take 100 mg by mouth 2 (two) times daily.     [provider]  amLODipine  (NORVASC ) 5 MG tablet Take 5 mg by mouth every morning.    [provider]  amoxicillin -clavulanate (AUGMENTIN ) 875-125 MG tablet Take 1 tablet by mouth 2 (two) times daily. 07/15/23   Efrain Lamar ORN, MD  bisoprolol  (ZEBETA ) 10 MG tablet Take 10 mg by mouth in the morning.    [provider]  Calcium  Carbonate-Vitamin D  (CALCIUM -D) 600-400 MG-UNIT TABS Take 1 tablet by mouth every morning.    [provider]  cholecalciferol  (VITAMIN D3) 25 MCG (1000 UNIT) tablet Take 1,000 Units by mouth daily. Patient not taking: Reported on 03/17/2023    [provider]  clobetasol  (TEMOVATE ) 0.05 % external solution Apply 1 application topically daily as needed (scalp itching/psoriasis).  [provider]  ferrous sulfate  (FERROUSUL) 325 (65 FE) MG tablet Take 1 tablet (325 mg total) by mouth 2 (two) times daily with a meal. 09/03/16   Fairy Frames, MD  fluocinonide  cream (LIDEX ) 0.05 % Apply 1 application topically 2 (two) times daily as needed (psoriasis).     [provider]  gabapentin  (NEURONTIN ) 100 MG capsule Take 1 capsule (100 mg total) by mouth 2 (two) times daily. 02/25/23   Elgergawy, Brayton RAMAN, MD  HYDROcodone -acetaminophen  (NORCO/VICODIN) 5-325 MG tablet Take 1  tablet by mouth every 6 (six) hours as needed for severe pain (pain score 7-10). Patient not taking: Reported on 03/17/2023 02/25/23   Elgergawy, Brayton RAMAN, MD  insulin  glargine (LANTUS  SOLOSTAR) 100 UNIT/ML Solostar Pen Inject 50 Units into the skin at bedtime. Patient taking differently: Inject 56 Units into the skin at bedtime. 03/28/21   Arlice Reichert, MD  loratadine  (CLARITIN ) 10 MG tablet Take 10 mg by mouth every morning.    [provider]  Multiple Vitamins-Iron (MULTIVITAMIN/IRON PO) Take 1 tablet by mouth every morning.    [provider]  silver  sulfADIAZINE  (SILVADENE ) 1 % cream Apply 1 Application topically daily. 05/19/23   Efrain Lamar ORN, MD  simvastatin  (ZOCOR ) 5 MG tablet Take 5 mg by mouth at bedtime.     [provider]  sitaGLIPtin (JANUVIA) 50 MG tablet Take 50 mg by mouth every morning.    [provider]  torsemide  (DEMADEX ) 20 MG tablet Take 2 tablets (40 mg total) by mouth daily. 02/25/23   Elgergawy, Brayton RAMAN, MD  vitamin C  (ASCORBIC ACID ) 500 MG tablet Take 500 mg by mouth every morning.    [provider]    Physical Exam: Vitals:   05/22/24 2002 05/22/24 2003  BP: (!) 178/73   Pulse: 62   Resp: 18   Temp: (!) 97.4 F (36.3 C)   TempSrc: Oral   SpO2: 99%   Weight:  63.5 kg  Height:  5' (1.524 m)   Physical Examination: General appearance - chronically ill appearing Chest - clear to auscultation, no wheezes, rales or rhonchi, symmetric air entry Heart - normal rate, regular rhythm, normal S1, S2, no murmurs, rubs, clicks or gallops Abdomen - soft, nontender, nondistended, no masses or organomegaly Extremities - pedal edema 1-2+  Data Reviewed: Results for orders placed or performed during the hospital encounter of 05/22/24 (from the past 24 hours)  CBG monitoring, ED     Status: Abnormal   Collection Time: 05/22/24  8:10 PM  Result Value Ref Range   Glucose-Capillary 155 (H) 70 - 99 mg/dL  Comprehensive  metabolic panel     Status: Abnormal   Collection Time: 05/22/24  8:16 PM  Result Value Ref Range   Sodium 143 135 - 145 mmol/L   Potassium 6.6 (HH) 3.5 - 5.1 mmol/L   Chloride 112 (H) 98 - 111 mmol/L   CO2 16 (L) 22 - 32 mmol/L   Glucose, Bld 169 (H) 70 - 99 mg/dL   BUN 80 (H) 8 - 23 mg/dL   Creatinine, Ser 4.49 (H) 0.44 - 1.00 mg/dL   Calcium  9.8 8.9 - 10.3 mg/dL   Total Protein 6.8 6.5 - 8.1 g/dL   Albumin 3.2 (L) 3.5 - 5.0 g/dL   AST 23 15 - 41 U/L   ALT 22 0 - 44 U/L   Alkaline Phosphatase 131 (H) 38 - 126 U/L   Total Bilirubin 0.2 0.0 - 1.2 mg/dL   GFR, Estimated  7 (L) >60 mL/min   Anion gap 15 5 - 15  CBC     Status: Abnormal   Collection Time: 05/22/24  8:16 PM  Result Value Ref Range   WBC 10.1 4.0 - 10.5 K/uL   RBC 3.22 (L) 3.87 - 5.11 MIL/uL   Hemoglobin 10.0 (L) 12.0 - 15.0 g/dL   HCT 69.8 (L) 63.9 - 53.9 %   MCV 93.5 80.0 - 100.0 fL   MCH 31.1 26.0 - 34.0 pg   MCHC 33.2 30.0 - 36.0 g/dL   RDW 81.3 (H) 88.4 - 84.4 %   Platelets 305 150 - 400 K/uL   nRBC 0.6 (H) 0.0 - 0.2 %     Assessment and Plan: * Hyperkalemia Status post multiple things to bring it down. Repeat BMP in 2 hours Admit to telemetry Patient is already on loop diuretics Add sodium bicarb to help facilitate lowering of potassium over time Patient confirmed her DNR status.  CKD (chronic kidney disease) stage 4, GFR 15-29 ml/min (HCC) Acute worsening of chronic disease. Avoid nephrotoxic agents Nephrology consult in the morning Patient has verbalized on multiple occasions her decision not to undergo dialysis. A discussion with palliative care as well as nephrology will be eminent.  Hypertension, essential Continue amlodipine , bisoprolol   Gout Continue allopurinol   Diabetes mellitus, type II, insulin  dependent (HCC) Patient has recently lost 40 pounds due to the loss of her husband.   Patient has been taken off her Januvia and insulin .   Her sugars have been well-controlled without any  medications. Will continue to monitor CBGs Sliding scale insulin       Advance Care Planning:   Code Status: Limited: Do not attempt resuscitation (DNR) -DNR-LIMITED -Do Not Intubate/DNI    Consults: Nephrology who will see her in the morning  Family Communication: Son at bedside  Severity of Illness: The appropriate patient status for this patient is INPATIENT. Inpatient status is judged to be reasonable and necessary in order to provide the required intensity of service to ensure the patient's safety. The patient's presenting symptoms, physical exam findings, and initial radiographic and laboratory data in the context of their chronic comorbidities is felt to place them at high risk for further clinical deterioration. Furthermore, it is not anticipated that the patient will be medically stable for discharge from the hospital within 2 midnights of admission.   * I certify that at the point of admission it is my clinical judgment that the patient will require inpatient hospital care spanning beyond 2 midnights from the point of admission due to high intensity of service, high risk for further deterioration and high frequency of surveillance required.*  Author: Glenys GORMAN Birk, MD 05/22/2024 9:17 PM  For on call review [email or web address].      [1]  Allergies Allergen Reactions   Sulfa Antibiotics Shortness Of Breath and Rash   Adhesive [Tape] Other (See Comments)    Causes redness, please use paper tape   Keflex [Cephalexin] Other (See Comments)    Possibly caused lethargy (taken with Flagyl) Tolerated cefepime  02/20/23   Lisinopril Cough   Metronidazole Other (See Comments)    Possibly caused lethargy (taken with keflex)   Teflaro  [Ceftaroline ] Other (See Comments)    Lethargy, possibly rash Tolerated cefepime  02/20/23   "

## 2024-05-22 NOTE — ED Provider Notes (Signed)
 " [place] EMERGENCY DEPARTMENT AT [facility] Provider Note   CSN: [id number] Arrival date & time: 05/22/24  1954     Patient presents with: Weakness   Angela Castro is a 84 y.o. female.  {Add pertinent medical, surgical, social history, OB history to YEP:67052} Patient complains of weakness.  She has a history of hypertension and diabetes   Weakness      Prior to Admission medications  Medication Sig Start Date End Date Taking? Authorizing Provider  allopurinol  (ZYLOPRIM ) 100 MG tablet Take 100 mg by mouth 2 (two) times daily.     [provider]  amLODipine  (NORVASC ) 5 MG tablet Take 5 mg by mouth every morning.    [provider]  amoxicillin -clavulanate (AUGMENTIN ) 875-125 MG tablet Take 1 tablet by mouth 2 (two) times daily. 07/15/23   Efrain Lamar ORN, MD  bisoprolol  (ZEBETA ) 10 MG tablet Take 10 mg by mouth in the morning.    [provider]  Calcium  Carbonate-Vitamin D  (CALCIUM -D) 600-400 MG-UNIT TABS Take 1 tablet by mouth every morning.    [provider]  cholecalciferol  (VITAMIN D3) 25 MCG (1000 UNIT) tablet Take 1,000 Units by mouth daily. Patient not taking: Reported on 03/17/2023    [provider]  clobetasol  (TEMOVATE ) 0.05 % external solution Apply 1 application topically daily as needed (scalp itching/psoriasis).    [provider]  ferrous sulfate  (FERROUSUL) 325 (65 FE) MG tablet Take 1 tablet (325 mg total) by mouth 2 (two) times daily with a meal. 09/03/16   Fairy Frames, MD  fluocinonide  cream (LIDEX ) 0.05 % Apply 1 application topically 2 (two) times daily as needed (psoriasis).     [provider]  gabapentin  (NEURONTIN ) 100 MG capsule Take 1 capsule (100 mg total) by mouth 2 (two) times daily. 02/25/23   Elgergawy, Brayton RAMAN, MD  HYDROcodone -acetaminophen  (NORCO/VICODIN) 5-325 MG tablet Take 1 tablet by mouth every 6 (six) hours as needed for severe pain (pain score 7-10). Patient  not taking: Reported on 03/17/2023 02/25/23   Elgergawy, Brayton RAMAN, MD  insulin  glargine (LANTUS  SOLOSTAR) 100 UNIT/ML Solostar Pen Inject 50 Units into the skin at bedtime. Patient taking differently: Inject 56 Units into the skin at bedtime. 03/28/21   Arlice Reichert, MD  loratadine  (CLARITIN ) 10 MG tablet Take 10 mg by mouth every morning.    [provider]  Multiple Vitamins-Iron (MULTIVITAMIN/IRON PO) Take 1 tablet by mouth every morning.    [provider]  silver  sulfADIAZINE  (SILVADENE ) 1 % cream Apply 1 Application topically daily. 05/19/23   Efrain Lamar ORN, MD  simvastatin  (ZOCOR ) 5 MG tablet Take 5 mg by mouth at bedtime.     [provider]  sitaGLIPtin (JANUVIA) 50 MG tablet Take 50 mg by mouth every morning.    [provider]  torsemide  (DEMADEX ) 20 MG tablet Take 2 tablets (40 mg total) by mouth daily. 02/25/23   Elgergawy, Brayton RAMAN, MD  vitamin C  (ASCORBIC ACID ) 500 MG tablet Take 500 mg by mouth every morning.    [provider]    Allergies: Sulfa antibiotics, Adhesive [tape], Keflex [cephalexin], Lisinopril, Metronidazole, and Teflaro  [ceftaroline ]    Review of Systems  Neurological:  Positive for weakness.    Updated Vital Signs BP (!) 178/73 (BP Location: Left Arm)   Pulse 62   Temp (!) 97.4 F (36.3 C) (Oral)   Resp 18   Ht 5' (1.524 m)   Wt 63.5 kg   SpO2 99%  BMI 27.34 kg/m   Physical Exam  (all labs ordered are listed, but only abnormal results are displayed) Labs Reviewed  COMPREHENSIVE METABOLIC PANEL WITH GFR - Abnormal; Notable for the following components:      Result Value   Potassium 6.6 (*)    Chloride 112 (*)    CO2 16 (*)    Glucose, Bld 169 (*)    BUN 80 (*)    Creatinine, Ser 5.50 (*)    Albumin 3.2 (*)    Alkaline Phosphatase 131 (*)    GFR, Estimated 7 (*)    All other components within normal limits  CBC - Abnormal; Notable for the following components:   RBC 3.22 (*)    Hemoglobin 10.0  (*)    HCT 30.1 (*)    RDW 18.6 (*)    nRBC 0.6 (*)    All other components within normal limits  CBG MONITORING, ED - Abnormal; Notable for the following components:   Glucose-Capillary 155 (*)    All other components within normal limits  URINALYSIS, ROUTINE W REFLEX MICROSCOPIC    EKG: None  Radiology: No results found.  {Document cardiac monitor, telemetry assessment procedure when appropriate:32947} Procedures   Medications Ordered in the ED  sodium zirconium cyclosilicate  (LOKELMA ) packet 10 g (has no administration in time range)  sodium bicarbonate  injection 50 mEq (has no administration in time range)  insulin  aspart (novoLOG ) injection 10 Units (has no administration in time range)    And  dextrose  50 % solution 50 mL (has no administration in time range)  calcium  gluconate inj 10% (1 g) URGENT USE ONLY! (has no administration in time range)  CRITICAL CARE Performed by: Fairy Sermon Total critical care time: 45 minutes Critical care time was exclusive of separately billable procedures and treating other patients. Critical care was necessary to treat or prevent imminent or life-threatening deterioration. Critical care was time spent personally by me on the following activities: development of treatment plan with patient and/or surrogate as well as nursing, discussions with consultants, evaluation of patient's response to treatment, examination of patient, obtaining history from patient or surrogate, ordering and performing treatments and interventions, ordering and review of laboratory studies, ordering and review of radiographic studies, pulse oximetry and re-evaluation of patient's condition.     {Click here for ABCD2, HEART and other calculators REFRESH Note before signing:1}                              Medical Decision Making Amount and/or Complexity of Data Reviewed Labs: ordered.  Risk OTC drugs. Prescription drug management. Decision regarding  hospitalization.   Patient with hyperkalemia.  Patient will be admitted to medicine with nephrology consult.  {Document critical care time when appropriate  Document review of labs and clinical decision tools ie CHADS2VASC2, etc  Document your independent review of radiology images and any outside records  Document your discussion with family members, caretakers and with consultants  Document social determinants of health affecting pt's care  Document your decision making why or why not admission, treatments were needed:32947:::1}   Final diagnoses:  Hypercalcemia    ED Discharge Orders     None        "

## 2024-05-22 NOTE — Assessment & Plan Note (Signed)
 Acute worsening of chronic disease. Avoid nephrotoxic agents Nephrology consult in the morning Patient has verbalized on multiple occasions her decision not to undergo dialysis. A discussion with palliative care as well as nephrology will be eminent.

## 2024-05-22 NOTE — ED Triage Notes (Signed)
 Pt bib ems from home with reports of weakness x 2 weeks. Pt has been sitting on the toilet since 1 pm and was unable to get up.

## 2024-05-22 NOTE — Assessment & Plan Note (Signed)
 Continue amlodipine , bisoprolol 

## 2024-05-22 NOTE — Assessment & Plan Note (Signed)
 Continue allopurinol 

## 2024-05-22 NOTE — Hospital Course (Signed)
 Patient is an 84 year old with history of T2DM, HTN, CHF, IDA, gout, peripheral neuropathy with loss of multiple fingers due to infection who has CKD 4 and has seen nephrology in the past but has no longer seeing them because she does not want to undergo dialysis.  She has noticed increased weakness and inability to walk over the last week.  Today she was quite unable to get up or down by herself.  She was brought in by EMS to the ED where she was found to have significant worsening of her renal function and hyperkalemia.  An EKG did not show significant changes she was given sodium bicarbonate , insulin  plus glucose, Lokelma , and calcium  gluconate.  They are asked to admit.

## 2024-05-22 NOTE — Assessment & Plan Note (Addendum)
 Status post multiple things to bring it down. Repeat BMP in 2 hours Admit to telemetry Patient is already on loop diuretics Add sodium bicarb to help facilitate lowering of potassium over time Patient confirmed her DNR status.

## 2024-05-22 NOTE — ED Notes (Signed)
 Pt given sausage biscuit and ginger ale.

## 2024-05-22 NOTE — Assessment & Plan Note (Signed)
 Patient has recently lost 40 pounds due to the loss of her husband.   Patient has been taken off her Januvia and insulin .   Her sugars have been well-controlled without any medications. Will continue to monitor CBGs Sliding scale insulin 

## 2024-05-23 ENCOUNTER — Encounter (HOSPITAL_COMMUNITY): Payer: Self-pay | Admitting: Family Medicine

## 2024-05-23 DIAGNOSIS — I10 Essential (primary) hypertension: Secondary | ICD-10-CM

## 2024-05-23 DIAGNOSIS — E875 Hyperkalemia: Secondary | ICD-10-CM

## 2024-05-23 DIAGNOSIS — E8729 Other acidosis: Secondary | ICD-10-CM | POA: Diagnosis not present

## 2024-05-23 DIAGNOSIS — Z515 Encounter for palliative care: Secondary | ICD-10-CM

## 2024-05-23 DIAGNOSIS — E119 Type 2 diabetes mellitus without complications: Secondary | ICD-10-CM

## 2024-05-23 DIAGNOSIS — N179 Acute kidney failure, unspecified: Secondary | ICD-10-CM

## 2024-05-23 DIAGNOSIS — I5030 Unspecified diastolic (congestive) heart failure: Secondary | ICD-10-CM

## 2024-05-23 DIAGNOSIS — M10379 Gout due to renal impairment, unspecified ankle and foot: Secondary | ICD-10-CM | POA: Diagnosis not present

## 2024-05-23 DIAGNOSIS — Z794 Long term (current) use of insulin: Secondary | ICD-10-CM

## 2024-05-23 DIAGNOSIS — N184 Chronic kidney disease, stage 4 (severe): Secondary | ICD-10-CM | POA: Diagnosis not present

## 2024-05-23 LAB — CBG MONITORING, ED
Glucose-Capillary: 117 mg/dL — ABNORMAL HIGH (ref 70–99)
Glucose-Capillary: 185 mg/dL — ABNORMAL HIGH (ref 70–99)
Glucose-Capillary: 98 mg/dL (ref 70–99)

## 2024-05-23 LAB — BASIC METABOLIC PANEL WITH GFR
Anion gap: 15 (ref 5–15)
Anion gap: 12 (ref 5–15)
BUN: 72 mg/dL — ABNORMAL HIGH (ref 8–23)
BUN: 75 mg/dL — ABNORMAL HIGH (ref 8–23)
CO2: 18 mmol/L — ABNORMAL LOW (ref 22–32)
CO2: 19 mmol/L — ABNORMAL LOW (ref 22–32)
Calcium: 9.8 mg/dL (ref 8.9–10.3)
Calcium: 9.4 mg/dL (ref 8.9–10.3)
Chloride: 110 mmol/L (ref 98–111)
Chloride: 111 mmol/L (ref 98–111)
Creatinine, Ser: 5.47 mg/dL — ABNORMAL HIGH (ref 0.44–1.00)
Creatinine, Ser: 5.35 mg/dL — ABNORMAL HIGH (ref 0.44–1.00)
GFR, Estimated: 7 mL/min — ABNORMAL LOW
GFR, Estimated: 7 mL/min — ABNORMAL LOW
Glucose, Bld: 105 mg/dL — ABNORMAL HIGH (ref 70–99)
Glucose, Bld: 154 mg/dL — ABNORMAL HIGH (ref 70–99)
Potassium: 5.7 mmol/L — ABNORMAL HIGH (ref 3.5–5.1)
Potassium: 6 mmol/L — ABNORMAL HIGH (ref 3.5–5.1)
Sodium: 143 mmol/L (ref 135–145)
Sodium: 142 mmol/L (ref 135–145)

## 2024-05-23 LAB — URINALYSIS, ROUTINE W REFLEX MICROSCOPIC
Bilirubin Urine: NEGATIVE
Glucose, UA: 150 mg/dL — AB
Hgb urine dipstick: NEGATIVE
Ketones, ur: NEGATIVE mg/dL
Leukocytes,Ua: NEGATIVE
Nitrite: NEGATIVE
Protein, ur: >=300 mg/dL — AB
Specific Gravity, Urine: 1.017 (ref 1.005–1.030)
pH: 5 (ref 5.0–8.0)

## 2024-05-23 LAB — IRON AND TIBC
Iron: 79 ug/dL (ref 28–170)
Saturation Ratios: 40 % — ABNORMAL HIGH (ref 10.4–31.8)
TIBC: 199 ug/dL — ABNORMAL LOW (ref 250–450)
UIBC: 120 ug/dL

## 2024-05-23 LAB — RENAL FUNCTION PANEL
Albumin: 2.6 g/dL — ABNORMAL LOW (ref 3.5–5.0)
Anion gap: 12 (ref 5–15)
BUN: 73 mg/dL — ABNORMAL HIGH (ref 8–23)
CO2: 18 mmol/L — ABNORMAL LOW (ref 22–32)
Calcium: 8.9 mg/dL (ref 8.9–10.3)
Chloride: 113 mmol/L — ABNORMAL HIGH (ref 98–111)
Creatinine, Ser: 5.16 mg/dL — ABNORMAL HIGH (ref 0.44–1.00)
GFR, Estimated: 8 mL/min — ABNORMAL LOW
Glucose, Bld: 155 mg/dL — ABNORMAL HIGH (ref 70–99)
Phosphorus: 5.4 mg/dL — ABNORMAL HIGH (ref 2.5–4.6)
Potassium: 5.7 mmol/L — ABNORMAL HIGH (ref 3.5–5.1)
Sodium: 143 mmol/L (ref 135–145)

## 2024-05-23 LAB — GLUCOSE, CAPILLARY
Glucose-Capillary: 134 mg/dL — ABNORMAL HIGH (ref 70–99)
Glucose-Capillary: 193 mg/dL — ABNORMAL HIGH (ref 70–99)

## 2024-05-23 LAB — FERRITIN: Ferritin: 322 ng/mL — ABNORMAL HIGH (ref 11–307)

## 2024-05-23 MED ORDER — PREGABALIN 25 MG PO CAPS: 25.0000 mg | ORAL_CAPSULE | Freq: Every day | ORAL | Status: DC

## 2024-05-23 MED ORDER — AMLODIPINE BESYLATE 10 MG PO TABS
10.0000 mg | ORAL_TABLET | Freq: Every morning | ORAL | Status: DC
  Administered 2024-05-23 – 2024-05-25 (×3): 10 mg via ORAL
  Filled 2024-05-23: qty 1
  Filled 2024-05-23: qty 2
  Filled 2024-05-23: qty 1

## 2024-05-23 MED ORDER — SODIUM ZIRCONIUM CYCLOSILICATE 10 G PO PACK
10.0000 g | PACK | Freq: Three times a day (TID) | ORAL | Status: DC
  Administered 2024-05-23 – 2024-05-24 (×6): 10 g via ORAL
  Filled 2024-05-23 (×7): qty 1

## 2024-05-23 MED ORDER — BISOPROLOL FUMARATE 5 MG PO TABS
10.0000 mg | ORAL_TABLET | Freq: Every day | ORAL | Status: DC
  Administered 2024-05-23 – 2024-05-25 (×3): 10 mg via ORAL
  Filled 2024-05-23 (×3): qty 2

## 2024-05-23 MED ORDER — FENTANYL CITRATE (PF) 50 MCG/ML IJ SOSY: 12.5000 ug | PREFILLED_SYRINGE | INTRAMUSCULAR | Status: DC | PRN

## 2024-05-23 MED ORDER — FUROSEMIDE 10 MG/ML IJ SOLN
60.0000 mg | Freq: Two times a day (BID) | INTRAMUSCULAR | Status: DC
  Administered 2024-05-23 – 2024-05-24 (×2): 60 mg via INTRAVENOUS
  Filled 2024-05-23 (×2): qty 6

## 2024-05-23 MED ORDER — NEPRO/CARBSTEADY PO LIQD
237.0000 mL | Freq: Two times a day (BID) | ORAL | Status: DC
  Administered 2024-05-24: 237 mL via ORAL
  Filled 2024-05-23 (×4): qty 237

## 2024-05-23 NOTE — Consult Note (Signed)
 Nephrology Consult   Requesting provider: Dr. Kathrin Service requesting consult: [facility] Reason for consult: AKI on CKD   Assessment/Recommendations:    CKD5 -progressive CKD in nature, eGFR was already 13 last year. Previously followed by Dr. Macel. Not interested in dialysis which she had discussed in Dr. Macel in the past about. Will respect her wishes. At this junction, recommend palliative care with home hospice -Avoid nephrotoxic medications including NSAIDs and iodinated intravenous contrast exposure unless the latter is absolutely indicated.  Preferred narcotic agents for pain control are hydromorphone , fentanyl , and methadone. Morphine should not be used. Avoid Baclofen and avoid oral sodium phosphate  and magnesium  citrate based laxatives / bowel preps. Continue strict Input and Output monitoring. Will monitor the patient closely with you and intervene or adjust therapy as indicated by changes in clinical status/labs   Hyperkalemia -improving. Med mgmt for now. Cont to monitor serial labs -c/w lokelma  10g TID -renal diet recommended -will start lasix  given her edema which should also help with kaliuresis  Chronic HFpEF -will start lasix  60mg  IV BID for now, can transition to PO torsemide  on discharge  Uncontrolled HTN -resume home meds, lasix  plan as above  Metabolic acidosis -mild, watching for now. On po bicarb-continue  Anemia -transfuse prn for hgb <7 -will check Fe panel  DNR.   Discussed with son [name] over the phone.  [facility] Stank [facility] 05/23/2024 12:50 PM   _____________________________________________________________________________________   History of Present Illness: Angela Castro is a/an 84 y.o. female with a past medical history of CKD5, CHF, DM2, HTN, gout, neuropathy, HLD, multiple finger amputations who presents to  [facility] with weakness. Found to have hyperkalemia and worsening of renal function. Last seen by Dr. Macel in Jan  '25, did not follow up thereafter. She had already expressed that she did not want to do HD and was already deemed as a poor dialysis candidate. Her eGFR from 05/19/23 was already 13. Patient seen in room. She reports that her weakness is also due to her swelling, feels like her legs are a little more swollen. Has been having loss of appetite as well especially since her husband unfortunately passed away last 07/13/2024. She does confirm that she does not want to do dialysis at all. She is receptive to home hospice, she reports that palliative care has already seen her. Denies any CP, SOB, dysgeusia, N/V. Her overall goal is to be home as much as possible.  Medications:  Current Facility-Administered Medications  Medication Dose Route Frequency Provider Last Rate Last Admin   0.9 %  sodium chloride  infusion  250 mL Intravenous PRN Pratt, Tanya S, MD       [START ON 05/26/2024] allopurinol  (ZYLOPRIM ) tablet 50 mg  50 mg Oral Once per day on Monday Thursday Fredirick Glenys RAMAN, MD       amLODipine  (NORVASC ) tablet 10 mg  10 mg Oral q morning Gonfa, Taye T, MD   10 mg at 05/23/24 1225   ascorbic acid  (VITAMIN C ) tablet 500 mg  500 mg Oral q morning Pratt, Tanya S, MD   500 mg at 05/23/24 1224   bisoprolol  (ZEBETA ) tablet 10 mg  10 mg Oral Daily Gonfa, Taye T, MD   10 mg at 05/23/24 1222   calcium -vitamin D  (OSCAL WITH D) 500-5 MG-MCG per tablet 1 tablet  1 tablet Oral q morning Fredirick Glenys RAMAN, MD       ferrous sulfate  tablet 325 mg  325 mg Oral BID WC Pratt, Tanya S,  MD   325 mg at 05/23/24 1225   heparin  injection 5,000 Units  5,000 Units Subcutaneous Q8H Pratt, Tanya S, MD   5,000 Units at 05/23/24 0510   insulin  aspart (novoLOG ) injection 0-9 Units  0-9 Units Subcutaneous TID WC Fredirick Glenys RAMAN, MD       loratadine  (CLARITIN ) tablet 10 mg  10 mg Oral q morning Pratt, Tanya S, MD   10 mg at 05/23/24 1223   metoprolol  tartrate (LOPRESSOR ) injection 5 mg  5 mg Intravenous Q5 min PRN Fredirick Glenys RAMAN, MD        ondansetron  (ZOFRAN ) tablet 4 mg  4 mg Oral Q6H PRN Fredirick Glenys RAMAN, MD       Or   ondansetron  (ZOFRAN ) injection 4 mg  4 mg Intravenous Q6H PRN Pratt, Tanya S, MD       polyethylene glycol (MIRALAX  / GLYCOLAX ) packet 17 g  17 g Oral Daily PRN Pratt, Tanya S, MD       pregabalin  (LYRICA ) capsule 25 mg  25 mg Oral Daily Pratt, Tanya S, MD   25 mg at 05/23/24 1228   simvastatin  (ZOCOR ) tablet 5 mg  5 mg Oral QHS Pratt, Tanya S, MD   5 mg at 05/22/24 2305   sodium bicarbonate  tablet 650 mg  650 mg Oral TID Pratt, Tanya S, MD   650 mg at 05/22/24 2304   sodium chloride  flush (NS) 0.9 % injection 3 mL  3 mL Intravenous Q12H Fredirick Glenys RAMAN, MD   3 mL at 05/23/24 1226   sodium chloride  flush (NS) 0.9 % injection 3 mL  3 mL Intravenous PRN Fredirick Glenys RAMAN, MD       sodium zirconium cyclosilicate  (LOKELMA ) packet 10 g  10 g Oral TID Dennise Hoes, MD   10 g at 05/23/24 0957   traMADol  (ULTRAM ) tablet 50 mg  50 mg Oral Q12H PRN Pratt, Tanya S, MD   50 mg at 05/23/24 9564   Current Outpatient Medications  Medication Sig Dispense Refill   allopurinol  (ZYLOPRIM ) 100 MG tablet Take 100 mg by mouth 2 (two) times daily.      amLODipine  (NORVASC ) 5 MG tablet Take 5 mg by mouth every morning.     aspirin  EC 81 MG tablet Take 81 mg by mouth daily. Swallow whole.     bisoprolol  (ZEBETA ) 10 MG tablet Take 10 mg by mouth in the morning.     Calcium  Carbonate-Vitamin D  (CALCIUM -D) 600-400 MG-UNIT TABS Take 1 tablet by mouth every morning.     clobetasol  (TEMOVATE ) 0.05 % external solution Apply 1 application topically daily as needed (scalp itching/psoriasis).     fluocinonide  cream (LIDEX ) 0.05 % Apply 1 application topically 2 (two) times daily as needed (psoriasis).      loratadine  (CLARITIN ) 10 MG tablet Take 10 mg by mouth every morning.     Multiple Vitamins-Iron (MULTIVITAMIN/IRON PO) Take 1 tablet by mouth every morning.     pentoxifylline (TRENTAL) 400 MG CR tablet Take 400 mg by mouth 3 (three) times daily.      pregabalin  (LYRICA ) 25 MG capsule Take 25 mg by mouth 2 (two) times daily.     simvastatin  (ZOCOR ) 5 MG tablet Take 5 mg by mouth at bedtime.      torsemide  (DEMADEX ) 20 MG tablet Take 2 tablets (40 mg total) by mouth daily. 60 tablet 0   traMADol  (ULTRAM ) 50 MG tablet Take 50 mg by mouth 3 (three) times daily as needed.  vitamin C  (ASCORBIC ACID ) 500 MG tablet Take 500 mg by mouth every morning.     amoxicillin -clavulanate (AUGMENTIN ) 875-125 MG tablet Take 1 tablet by mouth 2 (two) times daily. (Patient not taking: Reported on 05/22/2024) 60 tablet 5   cholecalciferol  (VITAMIN D3) 25 MCG (1000 UNIT) tablet Take 1,000 Units by mouth daily. (Patient not taking: Reported on 03/17/2023)     ferrous sulfate  (FERROUSUL) 325 (65 FE) MG tablet Take 1 tablet (325 mg total) by mouth 2 (two) times daily with a meal. (Patient not taking: Reported on 05/22/2024) 60 tablet 1   gabapentin  (NEURONTIN ) 100 MG capsule Take 1 capsule (100 mg total) by mouth 2 (two) times daily. (Patient not taking: Reported on 05/22/2024) 60 capsule 0   HYDROcodone -acetaminophen  (NORCO/VICODIN) 5-325 MG tablet Take 1 tablet by mouth every 6 (six) hours as needed for severe pain (pain score 7-10). (Patient not taking: Reported on 03/17/2023) 15 tablet 0   insulin  glargine (LANTUS  SOLOSTAR) 100 UNIT/ML Solostar Pen Inject 50 Units into the skin at bedtime. (Patient not taking: Reported on 05/22/2024)     silver  sulfADIAZINE  (SILVADENE ) 1 % cream Apply 1 Application topically daily. (Patient not taking: Reported on 05/22/2024) 50 g 1   sitaGLIPtin (JANUVIA) 50 MG tablet Take 50 mg by mouth every morning. (Patient not taking: Reported on 05/22/2024)       ALLERGIES Sulfa antibiotics, Adhesive [tape], Keflex [cephalexin], Lisinopril, Metronidazole, and Teflaro  [ceftaroline ]  MEDICAL HISTORY Past Medical History:  Diagnosis Date   Asthma    Diabetes mellitus without complication (HCC)    Hypertension    Vaccine counseling 03/17/2023      SOCIAL HISTORY Social History   Socioeconomic History   Marital status: Married    Spouse name: Not on file   Number of children: 2   Years of education: Not on file   Highest education level: Not on file  Occupational History   Not on file  Tobacco Use   Smoking status: Never   Smokeless tobacco: Never  Vaping Use   Vaping status: Never Used  Substance and Sexual Activity   Alcohol use: No   Drug use: No   Sexual activity: Not on file  Other Topics Concern   Not on file  Social History Narrative   Not on file   Social Drivers of Health   Tobacco Use: Low Risk (05/22/2024)   Patient History    Smoking Tobacco Use: Never    Smokeless Tobacco Use: Never    Passive Exposure: Not on file  Financial Resource Strain: Not on file  Food Insecurity: No Food Insecurity (02/19/2023)   Hunger Vital Sign    Worried About Running Out of Food in the Last Year: Never true    Ran Out of Food in the Last Year: Never true  Transportation Needs: No Transportation Needs (02/19/2023)   PRAPARE - Administrator, Civil Service (Medical): No    Lack of Transportation (Non-Medical): No  Physical Activity: Not on file  Stress: Not on file  Social Connections: Not on file  Intimate Partner Violence: Not At Risk (02/19/2023)   Humiliation, Afraid, Rape, and Kick questionnaire    Fear of Current or Ex-Partner: No    Emotionally Abused: No    Physically Abused: No    Sexually Abused: No  Depression (PHQ2-9): Low Risk (05/19/2023)   Depression (PHQ2-9)    PHQ-2 Score: 0  Alcohol Screen: Not on file  Housing: Low Risk (02/19/2023)   Housing  Last Housing Risk Score: 0  Utilities: Not At Risk (02/19/2023)   AHC Utilities    Threatened with loss of utilities: No  Health Literacy: Not on file     FAMILY HISTORY Family History  Problem Relation Age of Onset   Heart disease Mother      Review of Systems: 12 systems reviewed Otherwise as per HPI, all other systems  reviewed and negative  Physical Exam: Vitals:   05/23/24 1130 05/23/24 1131  BP: (!) 164/75   Pulse: 79   Resp: 19   Temp:  98.3 F (36.8 C)  SpO2: 97%    No intake/output data recorded. No intake or output data in the 24 hours ending 05/23/24 1250 General: well-appearing, no acute distress, laying flat in bed HEENT: anicteric sclera, oropharynx clear without lesions CV: regular rate, normal rhythm, no murmurs, no gallops, no rubs, 1-2+ pitting edema b/l LEs Lungs: clear to auscultation bilaterally, normal work of breathing Abd: soft, non-tender, non-distended Skin: no visible lesions or rashes Psych: alert, engaged, appropriate mood and affect Musculoskeletal: no obvious deformities Neuro: normal speech, no gross focal deficits   Test Results Reviewed Lab Results  Component Value Date   NA 143 05/23/2024   K 5.7 (H) 05/23/2024   CL 110 05/23/2024   CO2 18 (L) 05/23/2024   BUN 72 (H) 05/23/2024   CREATININE 5.47 (H) 05/23/2024   CALCIUM  9.8 05/23/2024   ALBUMIN 3.2 (L) 05/22/2024   PHOS 4.3 02/20/2023     I have reviewed all relevant outside healthcare records related to the patient's kidney injury.

## 2024-05-23 NOTE — Consult Note (Addendum)
 "                                                                                   Consultation Note Date: 05/23/2024   Patient Name: Angela Castro  DOB: 1941-01-13  MRN: [id number]  Age / Sex: 84 y.o., female  PCP: Kip Righter, MD Referring Physician: Kathrin Mignon DASEN, MD  Reason for Consultation: Establishing goals of care  HPI/Patient Profile: 84 y.o. female admitted on 05/22/2024   Clinical Assessment and Goals of Care:  84 year old woman with CKD stage 4-5 presenting with AKI (Cr plateau ~5.5 from prior ~3.5), severe hyperkalemia on admission, and metabolic acidosis, now partially improved with medical management. Hyperkalemia is responding to conservative measures (Lokelma , bicarbonate), and she is currently non-uremic, making urine, and clinically improved. Nephrology is consulted; remains admitted to hospital medicine service.  Medical management of hyperkalemia and renal failure and hypertension is being attempted.  A palliative consultation has been requested for goals of care discussions specifically in light of patient's progression of CKD and her wishes being not in favor of hemodialysis. Chart reviewed Patient seen and examined Discussed with patient and son present at bedside  Palliative medicine is specialized medical care for people living with serious illness. It focuses on providing relief from the symptoms and stress of a serious illness. The goal is to improve quality of life for both the patient and the family. Goals of care: Broad aims of medical therapy in relation to the patient's values and preferences. Our aim is to provide medical care aimed at enabling patients to achieve the goals that matter most to them, given the circumstances of their particular medical situation and their constraints.   NEXT OF KIN Son [name] present at bedside Patient's husband of over 60 years died in 2023-06-25, he was enrolled in hospice care through [facility] Patient states she has 2  children.   SUMMARY OF RECOMMENDATIONS    1. Advanced CKD with AKI, hyperkalemia, and metabolic acidosis - dialysis declined 84 year old woman with CKD stage 4-5 presenting with AKI (Cr plateau ~5.5 from prior ~3.5), severe hyperkalemia on admission, and metabolic acidosis, now partially improved with medical management. Hyperkalemia is responding to conservative measures (Lokelma , bicarbonate), and she is currently non-uremic, making urine, and clinically improved.   Nephrology is consulted; however, patient has been clear and consistent that she does not want dialysis under any circumstances. From a palliative perspective, we discussed that without dialysis, renal function is expected to remain fragile with high risk for recurrent hyperkalemia, acidosis, volume overload, and progressive functional decline.  However, we did discuss that home-based hospice support can assist with aggressive symptom management and to provide for control of symptoms pertaining to chronic kidney disease decline Continue non-dialytic medical management as aligned with her goals, including Lokelma , oral sodium bicarbonate , renal dosing of medications, telemetry while inpatient, and serial labs as clinically indicated.  Should opioids be required during this hospitalization, would recommend IV fentanyl  to be used on an as needed basis for pain and/or shortness of breath.  2. Goals of care, prognosis, and anticipatory guidance (high-complexity medical decision making) Extensive goals-of-care discussion held at bedside with patient and son. Patient demonstrates intact  decision-making capacity and reiterates preference for conservative kidney management and avoidance of life-prolonging dialysis. We discussed expected disease trajectory without dialysis, including potential months-to-shorter prognosis depending on complications, likelihood of recurrent hospitalizations, and eventual symptom burden (fatigue, anorexia,  itching,dyspnea, confusion) as renal failure progresses. Emphasized focus on quality of life, symptom control, and honoring her wish to remain independent as long as possible. Son verbalized understanding and appreciation for clarity. Palliative team to continue longitudinal support and revisit goals as condition evolves.  3. Symptom assessment and functional status Currently reports improved strength, no dyspnea, nausea, or pain; mild lower-extremity edema noted. High risk for future symptom burden related to renal failure, HFpEF, and neuropathy. Continue to monitor for pain, dyspnea, pruritus, nausea, anxiety, and delirium. Continue home pregabalin  and tramadol  (renally adjusted) for neuropathy-related discomfort; avoid nephrotoxic agents. Encourage energy conservation, fall precautions, and assistive device use given baseline frailty and walker dependence. Consider Fentanyl  IV PRN if pain control is an issue.   4. Comorbid conditions impacting prognosis and care planning HFpEF currently without cardiopulmonary symptoms; diuretics held appropriately in setting of AKI, with non-pharmacologic edema management. Hypertension remains labile and contributes to renal progression--medical management ongoing. Anemia of chronic kidney disease stable. Diabetes currently managed conservatively with sliding-scale insulin  only. These chronic conditions, in the setting of advanced renal disease and advanced age, further limit physiologic reserve and support a palliative, non-dialytic approach with goal to maximize comfort and reduce symptom burden.   5. Disposition planning and next steps Recommend home hospice follow-up after discharge to support conservative kidney management, symptom control, and future advance care planning    Discussed that hospice has become appropriate and will be helpful especially if she develops refractory symptoms, recurrent hyperkalemia, uremic symptoms, or further functional decline  consistent with end-stage renal disease without dialysis.  Recommended starting out with home with hospice and also described about residential hospice in detail.  Palliative medicine will continue to follow inpatient and coordinate with nephrology, hospital medicine, patient, and family to ensure care remains aligned with her stated goals.  Code Status/Advance Care Planning: DNR   Symptom Management:     Palliative Prophylaxis:  Delirium Protocol  Additional Recommendations (Limitations, Scope, Preferences): No Hemodialysis  Psycho-social/Spiritual:  Desire for further Chaplaincy support:yes Additional Recommendations: Education on Hospice  Prognosis:  < 6 months  Discharge Planning: Home with Hospice      Primary Diagnoses: Present on Admission:  Gout  Hypertension, essential  (Resolved) CKD (chronic kidney disease) stage 3, GFR 30-59 ml/min (HCC)  CKD (chronic kidney disease) stage 4, GFR 15-29 ml/min (HCC)  Hyperkalemia   I have reviewed the medical record, interviewed the patient and family, and examined the patient. The following aspects are pertinent.  Past Medical History:  Diagnosis Date   Asthma    Diabetes mellitus without complication (HCC)    Hypertension    Vaccine counseling 03/17/2023   Social History   Socioeconomic History   Marital status: Married    Spouse name: Not on file   Number of children: 2   Years of education: Not on file   Highest education level: Not on file  Occupational History   Not on file  Tobacco Use   Smoking status: Never   Smokeless tobacco: Never  Vaping Use   Vaping status: Never Used  Substance and Sexual Activity   Alcohol use: No   Drug use: No   Sexual activity: Not on file  Other Topics Concern   Not on file  Social History Narrative  Not on file   Social Drivers of Health   Tobacco Use: Low Risk (05/22/2024)   Patient History    Smoking Tobacco Use: Never    Smokeless Tobacco Use: Never     Passive Exposure: Not on file  Financial Resource Strain: Not on file  Food Insecurity: No Food Insecurity (02/19/2023)   Hunger Vital Sign    Worried About Running Out of Food in the Last Year: Never true    Ran Out of Food in the Last Year: Never true  Transportation Needs: No Transportation Needs (02/19/2023)   PRAPARE - Administrator, Civil Service (Medical): No    Lack of Transportation (Non-Medical): No  Physical Activity: Not on file  Stress: Not on file  Social Connections: Not on file  Depression (PHQ2-9): Low Risk (05/19/2023)   Depression (PHQ2-9)    PHQ-2 Score: 0  Alcohol Screen: Not on file  Housing: Low Risk (02/19/2023)   Housing    Last Housing Risk Score: 0  Utilities: Not At Risk (02/19/2023)   AHC Utilities    Threatened with loss of utilities: No  Health Literacy: Not on file   Family History  Problem Relation Age of Onset   Heart disease Mother    Scheduled Meds:  [START ON 05/26/2024] allopurinol   50 mg Oral Once per day on Monday Thursday   amLODipine   10 mg Oral q morning   ascorbic acid   500 mg Oral q morning   bisoprolol   10 mg Oral Daily   calcium -vitamin D   1 tablet Oral q morning   ferrous sulfate   325 mg Oral BID WC   heparin   5,000 Units Subcutaneous Q8H   insulin  aspart  0-9 Units Subcutaneous TID WC   loratadine   10 mg Oral q morning   pregabalin   25 mg Oral Daily   simvastatin   5 mg Oral QHS   sodium bicarbonate   650 mg Oral TID   sodium chloride  flush  3 mL Intravenous Q12H   sodium zirconium cyclosilicate   10 g Oral TID   Continuous Infusions:  sodium chloride      PRN Meds:.sodium chloride , metoprolol  tartrate, ondansetron  **OR** ondansetron  (ZOFRAN ) IV, polyethylene glycol, sodium chloride  flush, traMADol  Medications Prior to Admission:  Prior to Admission medications  Medication Sig Start Date End Date Taking? Authorizing Provider  allopurinol  (ZYLOPRIM ) 100 MG tablet Take 100 mg by mouth 2 (two) times daily.    Yes  [provider]  amLODipine  (NORVASC ) 5 MG tablet Take 5 mg by mouth every morning.   Yes [provider]  aspirin  EC 81 MG tablet Take 81 mg by mouth daily. Swallow whole.   Yes [provider]  bisoprolol  (ZEBETA ) 10 MG tablet Take 10 mg by mouth in the morning.   Yes [provider]  Calcium  Carbonate-Vitamin D  (CALCIUM -D) 600-400 MG-UNIT TABS Take 1 tablet by mouth every morning.   Yes [provider]  clobetasol  (TEMOVATE ) 0.05 % external solution Apply 1 application topically daily as needed (scalp itching/psoriasis).   Yes [provider]  fluocinonide  cream (LIDEX ) 0.05 % Apply 1 application topically 2 (two) times daily as needed (psoriasis).    Yes [provider]  loratadine  (CLARITIN ) 10 MG tablet Take 10 mg by mouth every morning.   Yes [provider]  Multiple Vitamins-Iron (MULTIVITAMIN/IRON PO) Take 1 tablet by mouth every morning.   Yes [provider]  pentoxifylline (TRENTAL) 400 MG CR tablet Take 400 mg by mouth 3 (three) times daily.  Yes [provider]  pregabalin  (LYRICA ) 25 MG capsule Take 25 mg by mouth 2 (two) times daily. 05/18/24  Yes [provider]  simvastatin  (ZOCOR ) 5 MG tablet Take 5 mg by mouth at bedtime.    Yes [provider]  torsemide  (DEMADEX ) 20 MG tablet Take 2 tablets (40 mg total) by mouth daily. 02/25/23  Yes Elgergawy, Brayton RAMAN, MD  traMADol  (ULTRAM ) 50 MG tablet Take 50 mg by mouth 3 (three) times daily as needed. 04/27/24  Yes [provider]  vitamin C  (ASCORBIC ACID ) 500 MG tablet Take 500 mg by mouth every morning.   Yes [provider]  amoxicillin -clavulanate (AUGMENTIN ) 875-125 MG tablet Take 1 tablet by mouth 2 (two) times daily. Patient not taking: Reported on 05/22/2024 07/15/23   Efrain Lamar ORN, MD  cholecalciferol  (VITAMIN D3) 25 MCG (1000 UNIT) tablet Take 1,000 Units by mouth daily. Patient not taking: Reported on  03/17/2023    [provider]  ferrous sulfate  (FERROUSUL) 325 (65 FE) MG tablet Take 1 tablet (325 mg total) by mouth 2 (two) times daily with a meal. Patient not taking: Reported on 05/22/2024 09/03/16   Fairy Frames, MD  gabapentin  (NEURONTIN ) 100 MG capsule Take 1 capsule (100 mg total) by mouth 2 (two) times daily. Patient not taking: Reported on 05/22/2024 02/25/23   Elgergawy, Brayton RAMAN, MD  HYDROcodone -acetaminophen  (NORCO/VICODIN) 5-325 MG tablet Take 1 tablet by mouth every 6 (six) hours as needed for severe pain (pain score 7-10). Patient not taking: Reported on 03/17/2023 02/25/23   Elgergawy, Brayton RAMAN, MD  insulin  glargine (LANTUS  SOLOSTAR) 100 UNIT/ML Solostar Pen Inject 50 Units into the skin at bedtime. Patient not taking: Reported on 05/22/2024 03/28/21   Arlice Reichert, MD  silver  sulfADIAZINE  (SILVADENE ) 1 % cream Apply 1 Application topically daily. Patient not taking: Reported on 05/22/2024 05/19/23   Efrain Lamar ORN, MD  sitaGLIPtin (JANUVIA) 50 MG tablet Take 50 mg by mouth every morning. Patient not taking: Reported on 05/22/2024    [provider]   Allergies[1] Review of Systems Complains of generalized weakness Physical Exam Elderly female resting in the stretcher in the emergency department Bilateral lower extremity edema Bilateral upper extremity edema Monitor noted Missing all fingers.  Mood and affect within normal limits Patient is awake alert oriented  Vital Signs: BP (!) 164/75   Pulse 79   Temp 98.3 F (36.8 C) (Oral)   Resp 19   Ht 5' (1.524 m)   Wt 63.5 kg   SpO2 97%   BMI 27.34 kg/m  Pain Scale: 0-10   Pain Score: 5    SpO2: SpO2: 97 % O2 Device:SpO2: 97 % O2 Flow Rate: .   IO: Intake/output summary: No intake or output data in the 24 hours ending 05/23/24 1253  LBM:   Baseline Weight: Weight: 63.5 kg Most recent weight: Weight: 63.5 kg     Palliative Assessment/Data:   Palliative performance scale 40%  Time In:  12 Time Out: 1320 Time Total: 80 Greater than 50%  of this time was spent counseling and coordinating care related to the above assessment and plan.  Signed by: Lonia Serve, MD   Please contact Palliative Medicine Team phone at [phone] for questions and concerns.  For individual provider: See Amion                 [1]  Allergies Allergen Reactions   Sulfa Antibiotics Shortness Of Breath and Rash   Adhesive [Tape] Other (See Comments)  Causes redness, please use paper tape   Keflex [Cephalexin] Other (See Comments)    Possibly caused lethargy (taken with Flagyl) Tolerated cefepime  02/20/23   Lisinopril Cough   Metronidazole Other (See Comments)    Possibly caused lethargy (taken with keflex)   Teflaro  [Ceftaroline ] Other (See Comments)    Lethargy, possibly rash Tolerated cefepime  02/20/23   "

## 2024-05-23 NOTE — Progress Notes (Signed)
 " PROGRESS NOTE  Angela Castro FMW:[id number] DOB: [birth year]   PCP: Kip Righter, MD  Patient is from: Home.  Lives alone.  Uses walker for mobility.  Has caregiver that visits twice a day.  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  Patient has seen nephrology, Dr. Macel in the past but has no longer seeing them because she does not want to undergo dialysis.   In ED, hypertensive with SBP 170s.  K6.6. Cr 5.5 about 3.5 in 2024).  BUN 80.  Bicarb 16.  AG 15.  Hgb 10.  UA with  > 300 proteins.  EKG without significant changes.  Patient received bicarb, insulin  and dextrose , Lokelma  and calcium  gluconate, and admitted.  Palliative medicine consulted.  The next day, Cr plateaued at 5.5.  K improved to 5.7.  Nephrology consulted.  Subjective: Seen and examined earlier this morning.  No major events overnight or this morning.  Reports feeling better this morning.  She says she feels stronger.  Denies shortness of breath.  Reports making good amount of urine.  Denies nausea, vomiting or abdominal pain.  She is not interested in dialysis.  Patient's son at bedside and likes to know what the future looks like without dialysis.  He is aware of nephrology and palliative consult..    Assessment and plan: Hyperkalemia: K improved from 6.6-5.7.  Hyperkalemia likely due to renal failure.  -Continue Lokelma  10 g 3 times daily -Continue p.o. sodium bicarbonate  650 mg 3 times daily. -Recheck renal panel this afternoon. -Continue telemetry monitoring    AKI on CKD-4: Could be progression of CKD.  Followed by nephrology, Dr. Macel outpatient.  Not interested in dialysis.  Creatinine  plateaued at 5.5.  Some improvement in BUN.  Not uremic. -Nephrology consulted -Hold home torsemide . - Renally dose medications.  Uncontrolled hypertension: BP elevated. -Increase amlodipine  and bisoprolol  -Hold torsemide  in the setting of AKI.  Chronic HFpEF: No cardiopulmonary symptoms.  She has 1+ BLE edema.  Reportedly improved per son. -Hold diuretics -Encourage leg elevation for edema.  Gout without acute flare -Continue allopurinol -renally adjusted.   NIDDM-2 with neuropathy and CKD-4: A1c 7.3% in 10/10.  Has been taken off insulin  and Januvia.  Recent Labs  Lab 05/22/24 2010 05/23/24 0001 05/23/24 0846  GLUCAP 155* 98 117*  -Continue SSI. -Continue home Lyrica  and tramadol . -Recheck hemoglobin A1c  Anion gap metabolic acidosis: Likely due to azotemia.  Improved. -Continue sodium bicarbonate   Anemia of renal disease: Stable Recent Labs    05/22/24 2016  HGB 10.0*  - Continue monitoring  Generalized weakness: Lives alone.  Uses walker for mobility.  Has caregiver that visits twice a day. - PT/OT eval  Goal of care discussion: Appropriately DNR.  Patient with progressive renal failure.  She is not interested in dialysis.  She may benefit from hospice at home. - Palliative medicine consulted.   Body mass index is 27.34 kg/m.           DVT prophylaxis:  heparin  injection 5,000 Units Start: 05/23/24 0600  Code Status: DNR Family Communication: Updated patient's son at bedside Level of care: Telemetry Status is: Inpatient Remains inpatient appropriate because: Hyperkalemia, AKI and weakness   Final  disposition: To be determined   55 minutes with more than 50% spent in reviewing records, counseling patient/family and coordinating care.  Consultants:  Nephrology Palliative medicine  Procedures: None  Microbiology summarized: None  Objective: Vitals:   05/23/24 0011 05/23/24 0024 05/23/24 0333 05/23/24 0600  BP:  (!) 165/78 (!) 174/72 (!)  160/89  Pulse:  79 84 81  Resp:  18 16 13   Temp: (!) 97.3 F (36.3 C)  97.8 F (36.6 C)   TempSrc: Oral  Oral   SpO2:  98% 99% 95%  Weight:      Height:        Examination:  GENERAL: No apparent distress.  Nontoxic. HEENT: MMM.  Vision and hearing grossly intact.  NECK: Supple.  No apparent JVD.  RESP:  No IWOB.  Fair aeration bilaterally. CVS:  RRR. Heart sounds normal.  ABD/GI/GU: BS+. Abd soft, NTND.  MSK/EXT:  Moves extremities.  1+ BLE edema.  Missing all fingers. SKIN: Chronic wound over right fifth digit.  No sign of infection. NEURO: AA.  Oriented appropriately.  No apparent focal neuro deficit. PSYCH: Calm. Normal affect.   Sch Meds:  Scheduled Meds:  [START ON 05/26/2024] allopurinol   50 mg Oral Once per day on Monday Thursday   amLODipine   10 mg Oral q morning   ascorbic acid   500 mg Oral q morning   bisoprolol   10 mg Oral Daily   calcium -vitamin D   1 tablet Oral q morning   ferrous sulfate   325 mg Oral BID WC   heparin   5,000 Units Subcutaneous Q8H   insulin  aspart  0-9 Units Subcutaneous TID WC   loratadine   10 mg Oral q morning   pregabalin   25 mg Oral Daily   simvastatin   5 mg Oral QHS   sodium bicarbonate   650 mg Oral TID   sodium chloride  flush  3 mL Intravenous Q12H   sodium zirconium cyclosilicate   10 g Oral TID   Continuous Infusions:  sodium chloride      PRN Meds:.sodium chloride , metoprolol  tartrate, ondansetron  **OR** ondansetron  (ZOFRAN ) IV, polyethylene glycol, sodium chloride  flush, traMADol   Antimicrobials: Anti-infectives (From admission, onward)    None        I have personally reviewed the following labs and images: CBC: Recent Labs  Lab 05/22/24 2016  WBC 10.1  HGB 10.0*  HCT 30.1*  MCV 93.5  PLT 305   BMP &GFR Recent Labs  Lab 05/22/24 2016 05/23/24 0040  NA 143 143  K 6.6* 5.7*  CL 112* 110  CO2 16* 18*  GLUCOSE 169* 105*  BUN 80* 72*  CREATININE 5.50* 5.47*  CALCIUM  9.8 9.8   Estimated Creatinine  Clearance: 6.6 mL/min (A) (by C-G formula based on SCr of 5.47 mg/dL (H)). Liver & Pancreas: Recent Labs  Lab 05/22/24 2016  AST 23  ALT 22  ALKPHOS 131*  BILITOT 0.2  PROT 6.8  ALBUMIN 3.2*   No results for input(s): LIPASE, AMYLASE in the last 168 hours. No results for input(s): AMMONIA in the last 168 hours. Diabetic: No results for input(s): HGBA1C in the last 72 hours. Recent Labs  Lab 05/22/24 2010 05/23/24 0001 05/23/24 0846  GLUCAP 155* 98 117*   Cardiac Enzymes: No results for input(s): CKTOTAL, CKMB, CKMBINDEX, TROPONINI in the last 168 hours. No results for input(s): PROBNP in the last 8760 hours. Coagulation Profile: No results for input(s): INR, PROTIME in the last 168 hours. Thyroid Function Tests: No results for input(s): TSH, T4TOTAL, FREET4, T3FREE, THYROIDAB  in the last 72 hours. Lipid Profile: No results for input(s): CHOL, HDL, LDLCALC, TRIG, CHOLHDL, LDLDIRECT in the last 72 hours. Anemia Panel: No results for input(s): VITAMINB12, FOLATE, FERRITIN, TIBC, IRON, RETICCTPCT in the last 72 hours. Urine analysis:    Component Value Date/Time   COLORURINE YELLOW 05/22/2024 0008   APPEARANCEUR HAZY (A) 05/22/2024 0008   LABSPEC 1.017 05/22/2024 0008   PHURINE 5.0 05/22/2024 0008   GLUCOSEU 150 (A) 05/22/2024 0008   HGBUR NEGATIVE 05/22/2024 0008   BILIRUBINUR NEGATIVE 05/22/2024 0008   KETONESUR NEGATIVE 05/22/2024 0008   PROTEINUR >=300 (A) 05/22/2024 0008   NITRITE NEGATIVE 05/22/2024 0008   LEUKOCYTESUR NEGATIVE 05/22/2024 0008   Sepsis Labs: Invalid input(s): PROCALCITONIN, LACTICIDVEN  Microbiology: No results found for this or any previous visit (from the past 240 hours).  Radiology Studies: No results found.    Jaclynne Baldo T. Kurt Hoffmeier [facility]  If 7PM-7AM, please contact night-coverage [email or web address] 05/23/2024, 11:28 AM   "

## 2024-05-24 DIAGNOSIS — E875 Hyperkalemia: Secondary | ICD-10-CM | POA: Diagnosis not present

## 2024-05-24 DIAGNOSIS — E119 Type 2 diabetes mellitus without complications: Secondary | ICD-10-CM | POA: Diagnosis not present

## 2024-05-24 DIAGNOSIS — N179 Acute kidney failure, unspecified: Secondary | ICD-10-CM | POA: Diagnosis not present

## 2024-05-24 DIAGNOSIS — N184 Chronic kidney disease, stage 4 (severe): Secondary | ICD-10-CM | POA: Diagnosis not present

## 2024-05-24 LAB — COMPREHENSIVE METABOLIC PANEL WITH GFR
ALT: 14 U/L (ref 0–44)
AST: 18 U/L (ref 15–41)
Albumin: 2.6 g/dL — ABNORMAL LOW (ref 3.5–5.0)
Alkaline Phosphatase: 106 U/L (ref 38–126)
Anion gap: 13 (ref 5–15)
BUN: 74 mg/dL — ABNORMAL HIGH (ref 8–23)
CO2: 19 mmol/L — ABNORMAL LOW (ref 22–32)
Calcium: 9.2 mg/dL (ref 8.9–10.3)
Chloride: 111 mmol/L (ref 98–111)
Creatinine, Ser: 5.5 mg/dL — ABNORMAL HIGH (ref 0.44–1.00)
GFR, Estimated: 7 mL/min — ABNORMAL LOW
Glucose, Bld: 174 mg/dL — ABNORMAL HIGH (ref 70–99)
Potassium: 5 mmol/L (ref 3.5–5.1)
Sodium: 143 mmol/L (ref 135–145)
Total Bilirubin: 0.2 mg/dL (ref 0.0–1.2)
Total Protein: 5.8 g/dL — ABNORMAL LOW (ref 6.5–8.1)

## 2024-05-24 LAB — PHOSPHORUS: Phosphorus: 5.2 mg/dL — ABNORMAL HIGH (ref 2.5–4.6)

## 2024-05-24 LAB — CBC
HCT: 26 % — ABNORMAL LOW (ref 36.0–46.0)
Hemoglobin: 8.6 g/dL — ABNORMAL LOW (ref 12.0–15.0)
MCH: 30.9 pg (ref 26.0–34.0)
MCHC: 33.1 g/dL (ref 30.0–36.0)
MCV: 93.5 fL (ref 80.0–100.0)
Platelets: 275 K/uL (ref 150–400)
RBC: 2.78 MIL/uL — ABNORMAL LOW (ref 3.87–5.11)
RDW: 18.6 % — ABNORMAL HIGH (ref 11.5–15.5)
WBC: 11.8 K/uL — ABNORMAL HIGH (ref 4.0–10.5)
nRBC: 0.4 % — ABNORMAL HIGH (ref 0.0–0.2)

## 2024-05-24 LAB — HEMOGLOBIN A1C
Hgb A1c MFr Bld: 5.7 % — ABNORMAL HIGH (ref 4.8–5.6)
Mean Plasma Glucose: 116.89 mg/dL

## 2024-05-24 LAB — GLUCOSE, CAPILLARY
Glucose-Capillary: 112 mg/dL — ABNORMAL HIGH (ref 70–99)
Glucose-Capillary: 164 mg/dL — ABNORMAL HIGH (ref 70–99)
Glucose-Capillary: 171 mg/dL — ABNORMAL HIGH (ref 70–99)
Glucose-Capillary: 237 mg/dL — ABNORMAL HIGH (ref 70–99)

## 2024-05-24 LAB — MAGNESIUM: Magnesium: 1.9 mg/dL (ref 1.7–2.4)

## 2024-05-24 MED ORDER — TORSEMIDE 20 MG PO TABS
40.0000 mg | ORAL_TABLET | Freq: Two times a day (BID) | ORAL | Status: DC
  Administered 2024-05-24 – 2024-05-25 (×2): 40 mg via ORAL
  Filled 2024-05-24 (×2): qty 2

## 2024-05-24 MED ORDER — PREGABALIN 25 MG PO CAPS
25.0000 mg | ORAL_CAPSULE | Freq: Two times a day (BID) | ORAL | Status: DC
  Administered 2024-05-24 – 2024-05-25 (×2): 25 mg via ORAL
  Filled 2024-05-24 (×2): qty 1

## 2024-05-24 NOTE — TOC Initial Note (Signed)
 Transition of Care [facility]) - Initial/Assessment Note   Patient Details  Name: Angela Castro MRN: [id number] Date of Birth: 11-25-1940  [facility]) CM/SW Contact:    Duwaine GORMAN Aran, LCSW Phone Number: 05/24/2024, 2:35 PM  Clinical Narrative: Care management consulted for home hospice services. CSW spoke with son, [name], to get choice. Son requested [facility] as the family has used the agency before. Son reported family will transport the patient home at discharge. CSW made home hospice referral to Melissa/Amy with [facility]. Per OT and PT's notes, patient will need a hospital bed. [facility] to deliver DME to patient's home today. CSW notified by hospitalist that patient is expected to discharge tomorrow. Care management to follow.  Expected Discharge Plan: Home w Hospice Care Barriers to Discharge: Continued Medical Work up  Patient Goals and CMS Choice Patient states their goals for this hospitalization and ongoing recovery are:: Home with hospice CMS Medicare.gov Compare Post Acute Care list provided to:: Patient Represenative (must comment) Jacquette Canales (son)) Choice offered to / list presented to : Adult Children  Expected Discharge Plan and Services In-house Referral: Clinical Social Work, Hospice / Palliative Care Post Acute Care Choice: Hospice Living arrangements for the past 2 months: Single Family Home             DME Arranged: Hospital bed DME Agency: Other - Comment ([facility] hospice to set up hospital bed.)  Prior Living Arrangements/Services Living arrangements for the past 2 months: Single Family Home Lives with:: Self Patient language and need for interpreter reviewed:: Yes Do you feel safe going back to the place where you live?: Yes      Need for Family Participation in Patient Care: Yes (Comment) Care giver support system in place?: Yes (comment) Current home services: Other (comment) (Has 2 caregivers that come during the week in the morning  and afternoon for a few hours.) Criminal Activity/Legal Involvement Pertinent to Current Situation/Hospitalization: No - Comment as needed  Activities of Daily Living ADL Screening (condition at time of admission) Independently performs ADLs?: No Does the patient have a NEW difficulty with bathing/dressing/toileting/self-feeding that is expected to last >3 days?: Yes (Initiates electronic notice to provider for possible OT consult) Does the patient have a NEW difficulty with getting in/out of bed, walking, or climbing stairs that is expected to last >3 days?: Yes (Initiates electronic notice to provider for possible PT consult) Does the patient have a NEW difficulty with communication that is expected to last >3 days?: No Is the patient deaf or have difficulty hearing?: Yes Does the patient have difficulty seeing, even when wearing glasses/contacts?: No Does the patient have difficulty concentrating, remembering, or making decisions?: No  Permission Sought/Granted Permission sought to share information with : Other (comment) Permission granted to share information with : Yes, Verbal Permission Granted Permission granted to share info w AGENCY: [facility]  Emotional Assessment Appearance:: Appears stated age Attitude/Demeanor/Rapport: Lethargic Alcohol / Substance Use: Not Applicable Psych Involvement: No (comment)  Admission diagnosis:  Hyperkalemia [E87.5] Patient Active Problem List   Diagnosis Date Noted   Palliative care by specialist 05/23/2024   Hyperkalemia 05/22/2024   Finger wound, simple, open, sequela 05/19/2023   Osteolysis of both hands 05/19/2023   Vaccine counseling 03/17/2023   Suppurative tenosynovitis of flexor tendon of right hand 02/23/2023   Hypomagnesemia 02/20/2023   Anemia 02/20/2023   Chronic heart failure with preserved ejection fraction (HCC) 01/16/2023   Exertional dyspnea 12/16/2022   Chronic kidney disease (CKD), stage IV (severe) (HCC)  12/16/2022    Femur fracture (HCC) 03/24/2021   Closed fracture of right distal femur (HCC) 03/24/2021   Bilateral lower extremity edema 03/24/2021   Fall    Finger osteomyelitis, right (HCC) 08/24/2016   General weakness 08/24/2016   Type 2 diabetes mellitus with obesity 08/24/2016   Acute-on-chronic renal failure 08/24/2016   Foot ulcer with fat layer exposed, left (HCC) 08/19/2016   Goals of care, counseling/discussion 08/19/2016   Chronic osteomyelitis (HCC) 07/29/2016   Diabetes mellitus, type II, insulin  dependent (HCC) 07/14/2016   Gout 07/14/2016   Hypertension, essential 07/14/2016   Generalized osteoarthritis 07/14/2016   PCP:  Kip Righter, MD Pharmacy:   [facility] - [place], [place] - [address] [place] [address] Phone: [phone] Fax: [phone]  [facility] [address] [place] [address] Phone: [phone] Fax: [phone]  Social Drivers of Health (SDOH) Social History: SDOH Screenings   Food Insecurity: No Food Insecurity (05/23/2024)  Housing: Low Risk (05/23/2024)  Transportation Needs: No Transportation Needs (05/23/2024)  Utilities: Not At Risk (05/23/2024)  Depression (PHQ2-9): Low Risk (05/19/2023)  Social Connections: Unknown (05/23/2024)  Tobacco Use: Low Risk (05/23/2024)   SDOH Interventions:    Readmission Risk Interventions     No data to display

## 2024-05-24 NOTE — Progress Notes (Signed)
 " PROGRESS NOTE  Angela Castro FMW:[id number] DOB: 1940/11/25   PCP: Kip Righter, MD  Patient is from: Home.  Lives alone.  Uses walker for mobility.  Has caregiver that visits twice a day.  DOA: 05/22/2024 LOS: 2  Chief complaints Chief Complaint  Patient presents with   Weakness     Brief Narrative / Interim history: 84 year old F with PMH of CKD-4, HFpEF, DM-2 not on meds, HTN, anemia, gout and  neuropathy with loss of multiple digits bilaterally presenting with progressive weakness and admitted with hyperkalemia, AKI and generalized weakness.  Patient has seen nephrology, Dr. Macel in the past but has no longer seeing them because she does not want to undergo dialysis.   In ED, hypertensive with SBP 170s.  K6.6. Cr 5.5 about 3.5 in 2024).  BUN 80.  Bicarb 16.  AG 15.  Hgb 10.  UA with  > 300 proteins.  EKG without significant changes.  Patient received bicarb, insulin  and dextrose , Lokelma  and calcium  gluconate, and admitted.  Palliative medicine consulted.  The next day, Cr plateaued at 5.5.  K improved to 5.7.  Nephrology consulted and started IV Lasix .  Hyperkalemia resolved.  Creatinine plateaued at 5.5.  Patient is not interested in dialysis.  Palliative medicine saw patient.  Plan to discharge home with home hospice on 1/14.  Subjective: Seen and examined earlier this morning.  No major events overnight or this morning.  No complaints other than neuropathic pain in his legs.  She says she lost her husband about a year ago, and she is ready to go and be with him.    Assessment and plan: Hyperkalemia: K improved from 6.6-5.7.  Hyperkalemia likely due to renal failure.  Resolved. -Continue Lokelma  and sodium bicarbonate  while in house.   CKD-5: Per renal, eGFR was already 13 last year.  Not interested in dialysis.  Not uremic.  AKI ruled out. - Appreciate input by nephrology - Continue IV Lasix  40 mg twice daily while in house.  Uncontrolled hypertension: BP elevated but  improved. - Continue amlodipine  and bisoprolol  - Continue IV Lasix  per nephrology.  Chronic HFpEF: No cardiopulmonary symptoms.  She has 1+ BLE edema.  -Continue IV Lasix  as above. -Encourage leg elevation for edema.  Gout without acute flare -Continue allopurinol -renally adjusted.   NIDDM-2 with neuropathy and CKD-4: A1c 5.7% (was 7.3% in 10/10).  Has been taken off insulin  and Januvia.  Recent Labs  Lab 05/23/24 1311 05/23/24 1817 05/23/24 2113 05/24/24 0724 05/24/24 1108  GLUCAP 185* 134* 193* 112* 164*  -Continue SSI. -Continue home Lyrica  and tramadol .  Anion gap metabolic acidosis: Likely due to azotemia.  Improved. -Continue sodium bicarbonate   Anemia of renal disease: Hgb dropped some.  No overt bleeding.  Generalized weakness: Lives alone.  Uses walker for mobility.  Has caregiver that visits twice a day. - PT/OT recommended home health but patient will be discharging with home hospice once DME delivered.  Goal of care discussion: Appropriately DNR.  Patient with progressive renal failure.  She is not interested in dialysis.  She says I am ready to go and meet my husband again -Palliative medicine on board.  Plan for discharge home with home hospice once DME in place.   Body mass index is 29.69 kg/m (pended).         Pressure skin injury: Present on admission. Wound 05/23/24 1730 Pressure Injury Ischial tuberosity Right Stage 2 -  Partial thickness loss of dermis presenting as a shallow open injury with a  red, pink wound bed without slough. (Active)   DVT prophylaxis:  heparin  injection 5,000 Units Start: 05/23/24 0600  Code Status: DNR Family Communication: Updated patient's son over the phone. Level of care: Telemetry Status is: Inpatient Remains inpatient appropriate because: Hyperkalemia, AKI and weakness   Final disposition: Home with home hospice.   55 minutes with more than 50% spent in reviewing records, counseling patient/family and  coordinating care.  Consultants:  Nephrology Palliative medicine  Procedures: None  Microbiology summarized: None  Objective: Vitals:   05/23/24 2115 05/24/24 0126 05/24/24 0534 05/24/24 1400  BP: 138/65 (!) 154/78 (!) 171/67 (!) 154/69  Pulse: 79 86 87 88  Resp: 14 16 15 20   Temp: 98.7 F (37.1 C) 98.5 F (36.9 C) 99 F (37.2 C) 98.9 F (37.2 C)  TempSrc: Oral Oral Oral Oral  SpO2: 95% 96% 96% 98%  Weight:      Height:        Examination:  GENERAL: No apparent distress.  Nontoxic. HEENT: MMM.  Vision and hearing grossly intact.  NECK: Supple.  No apparent JVD.  RESP:  No IWOB.  Fair aeration bilaterally. CVS:  RRR. Heart sounds normal.  ABD/GI/GU: BS+. Abd soft, NTND.  MSK/EXT:  Moves extremities.  1+ BLE edema.  Missing all fingers. SKIN: Chronic wound over right fifth digit.  No sign of infection. NEURO: AA.  Oriented appropriately.  No apparent focal neuro deficit. PSYCH: Calm. Normal affect.   Sch Meds:  Scheduled Meds:  [START ON 05/26/2024] allopurinol   50 mg Oral Once per day on Monday Thursday   amLODipine   10 mg Oral q morning   ascorbic acid   500 mg Oral q morning   bisoprolol   10 mg Oral Daily   calcium -vitamin D   1 tablet Oral q morning   feeding supplement (NEPRO CARB STEADY)  237 mL Oral BID BM   ferrous sulfate   325 mg Oral BID WC   furosemide   60 mg Intravenous BID   heparin   5,000 Units Subcutaneous Q8H   insulin  aspart  0-9 Units Subcutaneous TID WC   loratadine   10 mg Oral q morning   pregabalin   25 mg Oral BID   simvastatin   5 mg Oral QHS   sodium bicarbonate   650 mg Oral TID   sodium chloride  flush  3 mL Intravenous Q12H   sodium zirconium cyclosilicate   10 g Oral TID   Continuous Infusions:   PRN Meds:.fentaNYL  (SUBLIMAZE ) injection, metoprolol  tartrate, ondansetron  **OR** ondansetron  (ZOFRAN ) IV, polyethylene glycol, sodium chloride  flush, traMADol   Antimicrobials: Anti-infectives (From admission, onward)    None         I have personally reviewed the following labs and images: CBC: Recent Labs  Lab 05/22/24 2016 05/24/24 1110  WBC 10.1 11.8*  HGB 10.0* 8.6*  HCT 30.1* 26.0*  MCV 93.5 93.5  PLT 305 275   BMP &GFR Recent Labs  Lab 05/22/24 2016 05/23/24 0040 05/23/24 1627 05/23/24 1753 05/24/24 1110  NA 143 143 143 142 143  K 6.6* 5.7* 5.7* 6.0* 5.0  CL 112* 110 113* 111 111  CO2 16* 18* 18* 19* 19*  GLUCOSE 169* 105* 155* 154* 174*  BUN 80* 72* 73* 75* 74*  CREATININE 5.50* 5.47* 5.16* 5.35* 5.50*  CALCIUM  9.8 9.8 8.9 9.4 9.2  MG  --   --   --   --  1.9  PHOS  --   --  5.4*  --  5.2*   Estimated Creatinine Clearance: 6.6 mL/min (A) (by  C-G formula based on SCr of 5.5 mg/dL (H)). Liver & Pancreas: Recent Labs  Lab 05/22/24 2016 05/23/24 1627 05/24/24 1110  AST 23  --  18  ALT 22  --  14  ALKPHOS 131*  --  106  BILITOT 0.2  --  0.2  PROT 6.8  --  5.8*  ALBUMIN 3.2* 2.6* 2.6*   No results for input(s): LIPASE, AMYLASE in the last 168 hours. No results for input(s): AMMONIA in the last 168 hours. Diabetic: Recent Labs    05/23/24 1753  HGBA1C 5.7*   Recent Labs  Lab 05/23/24 1311 05/23/24 1817 05/23/24 2113 05/24/24 0724 05/24/24 1108  GLUCAP 185* 134* 193* 112* 164*   Cardiac Enzymes: No results for input(s): CKTOTAL, CKMB, CKMBINDEX, TROPONINI in the last 168 hours. No results for input(s): PROBNP in the last 8760 hours. Coagulation Profile: No results for input(s): INR, PROTIME in the last 168 hours. Thyroid Function Tests: No results for input(s): TSH, T4TOTAL, FREET4, T3FREE, THYROIDAB in the last 72 hours. Lipid Profile: No results for input(s): CHOL, HDL, LDLCALC, TRIG, CHOLHDL, LDLDIRECT in the last 72 hours. Anemia Panel: Recent Labs    05/23/24 1627  FERRITIN 322*  TIBC 199*  IRON 79   Urine analysis:    Component Value Date/Time   COLORURINE YELLOW 05/22/2024 0008   APPEARANCEUR HAZY (A)  05/22/2024 0008   LABSPEC 1.017 05/22/2024 0008   PHURINE 5.0 05/22/2024 0008   GLUCOSEU 150 (A) 05/22/2024 0008   HGBUR NEGATIVE 05/22/2024 0008   BILIRUBINUR NEGATIVE 05/22/2024 0008   KETONESUR NEGATIVE 05/22/2024 0008   PROTEINUR >=300 (A) 05/22/2024 0008   NITRITE NEGATIVE 05/22/2024 0008   LEUKOCYTESUR NEGATIVE 05/22/2024 0008   Sepsis Labs: Invalid input(s): PROCALCITONIN, LACTICIDVEN  Microbiology: No results found for this or any previous visit (from the past 240 hours).  Radiology Studies: No results found.    Avo Schlachter T. Giulia Hickey [facility]  If 7PM-7AM, please contact night-coverage [email or web address] 05/24/2024, 3:51 PM   "

## 2024-05-24 NOTE — Progress Notes (Signed)
 " [facility] Progress Note    Assessment/ Plan:   CKD5 -progressive CKD in nature, eGFR was already 13 last year. Previously followed by Dr. Macel. Not interested in dialysis which she had discussed in Dr. Macel in the past about. Will respect her wishes. At this junction, recommend palliative care with home hospice -Avoid nephrotoxic medications including NSAIDs and iodinated intravenous contrast exposure unless the latter is absolutely indicated.  Preferred narcotic agents for pain control are hydromorphone , fentanyl , and methadone. Morphine should not be used. Avoid Baclofen and avoid oral sodium phosphate  and magnesium  citrate based laxatives / bowel preps. Continue strict Input and Output monitoring. Will monitor the patient closely with you and intervene or adjust therapy as indicated by changes in clinical status/labs    Hyperkalemia -continue with lokelma  -c/w diuretics to assist with kaluresis and volume    Chronic HFpEF -switching iv lasix  to torsemide  40mg  BID, can continue on discharge   Uncontrolled HTN -resume home meds, lasix  plan as above   Metabolic acidosis -mild, watching for now. On po bicarb-continue   Anemia -transfuse prn for hgb <7 -iron replete -hgb 8.6, w/u per primary service  Nothing else to add from an inpatient nephrology perspective since patient will be going on home hospice. Doesn't need follow up with us  if that is the case. Will sign off. Please call with any questions/concerns.  Discussed with primary service.  Subjective:   Patient seen in room. No complaints. She does report some slightly improvement in swelling   Objective:   BP (!) 171/67 (BP Location: Right Arm)   Pulse 87   Temp 99 F (37.2 C) (Oral)   Resp 15   Ht (P) 4' 11 (1.499 m)   Wt (P) 66.7 kg   SpO2 96%   BMI (P) 29.69 kg/m   Intake/Output Summary (Last 24 hours) at 05/24/2024 1207 Last data filed at 05/24/2024 0850 Gross per 24 hour  Intake 240 ml   Output --  Net 240 ml   Weight change:   Physical Exam: Gen: NAD CVS: RRR Resp: unlabored, normal wob Abd: soft, nt/nd Ext: trace to 1+ pitting edema Neuro: awake, alert  Imaging: No results found.  Labs: BMET Recent Labs  Lab 05/22/24 2016 05/23/24 0040 05/23/24 1627 05/23/24 1753 05/24/24 1110  NA 143 143 143 142 143  K 6.6* 5.7* 5.7* 6.0* 5.0  CL 112* 110 113* 111 111  CO2 16* 18* 18* 19* 19*  GLUCOSE 169* 105* 155* 154* 174*  BUN 80* 72* 73* 75* 74*  CREATININE 5.50* 5.47* 5.16* 5.35* 5.50*  CALCIUM  9.8 9.8 8.9 9.4 9.2  PHOS  --   --  5.4*  --  5.2*   CBC Recent Labs  Lab 05/22/24 2016 05/24/24 1110  WBC 10.1 11.8*  HGB 10.0* 8.6*  HCT 30.1* 26.0*  MCV 93.5 93.5  PLT 305 275    Medications:     [START ON 05/26/2024] allopurinol   50 mg Oral Once per day on Monday Thursday   amLODipine   10 mg Oral q morning   ascorbic acid   500 mg Oral q morning   bisoprolol   10 mg Oral Daily   calcium -vitamin D   1 tablet Oral q morning   feeding supplement (NEPRO CARB STEADY)  237 mL Oral BID BM   ferrous sulfate   325 mg Oral BID WC   furosemide   60 mg Intravenous BID   heparin   5,000 Units Subcutaneous Q8H   insulin  aspart  0-9 Units Subcutaneous TID  WC   loratadine   10 mg Oral q morning   pregabalin   25 mg Oral BID   simvastatin   5 mg Oral QHS   sodium bicarbonate   650 mg Oral TID   sodium chloride  flush  3 mL Intravenous Q12H   sodium zirconium cyclosilicate   10 g Oral TID      Ephriam Stank, MD [facility] 05/24/2024, 12:07 PM   "

## 2024-05-24 NOTE — Evaluation (Signed)
 Occupational Therapy Evaluation Patient Details Name: Angela Castro MRN: [id number] DOB: 1940/08/31 Today's Date: 05/24/2024   History of Present Illness   Pt is a 84 y.o. female admitted with hyperkalemia, generalized weakness, AKI & worsening renal function. PMH significant for progressive CKD Stage 4, R femur fx s/p ORIF, CHF, DM Type II, gout, neuropathy, HLD, multiple finger amputations     Clinical Impressions Pt admitted with above. Prior to admission, pt lives alone and has PCA 2x a day to assist with LB ADLs, meals and household chores. Pt requires CGA - MIN A for bed mobility, MIN A for UB bathing + dressing, MAX A for LB bathing and pericare. Completes functional mobility in room with RW + MOD A (pt uses 4WW at baseline), heavy reliance on UE support with bilateral hands placed on front of RW and leaning on elbows, forward flexed posture throughout. Per chart, pt discharging home with hospice. If pt and family wishes, could consider HH OT to focus on quality of life and functional transfers. OT will follow acutely.   Pt would benefit from skilled OT services to address noted impairments and functional limitations (see below for any additional details) in order to maximize safety and independence while minimizing falls risk and caregiver burden. Anticipate the need for follow up [facility] OT services upon acute hospital DC.      If plan is discharge home, recommend the following:   A little help with walking and/or transfers;A little help with bathing/dressing/bathroom;Supervision due to cognitive status;Help with stairs or ramp for entrance;Assist for transportation;Direct supervision/assist for financial management;Direct supervision/assist for medications management;Assistance with cooking/housework     Functional Status Assessment   Patient has had a recent decline in their functional status and demonstrates the ability to make significant improvements in function in a reasonable  and predictable amount of time.     Equipment Recommendations   Hospital bed      Precautions/Restrictions   Precautions Precautions: Fall Precaution/Restrictions Comments: multiple digit amputations Restrictions Weight Bearing Restrictions Per Provider Order: No     Mobility Bed Mobility Overal bed mobility: Needs Assistance Bed Mobility: Supine to Sit, Sit to Supine     Supine to sit: Contact guard Sit to supine: Min assist   General bed mobility comments: CGA for safety, pt with poor body awareness, minA for LE mgmt to return to supine    Transfers Overall transfer level: Needs assistance Equipment used: Rolling walker (2 wheels) Transfers: Sit to/from Stand Sit to Stand: Min assist                  Balance Overall balance assessment: Needs assistance, History of Falls Sitting-balance support: Feet supported Sitting balance-Leahy Scale: Good     Standing balance support: During functional activity, Reliant on assistive device for balance, Bilateral upper extremity supported Standing balance-Leahy Scale: Fair Standing balance comment: requires forward flexed posture, places hands on front of RW with elbow support                           ADL either performed or assessed with clinical judgement   ADL Overall ADL's : Needs assistance/impaired Eating/Feeding: Sitting;Set up   Grooming: Sitting;Set up;Wash/dry hands;Wash/dry face Grooming Details (indicate cue type and reason): at EOB Upper Body Bathing: Sitting;Minimal assistance Upper Body Bathing Details (indicate cue type and reason): at EOB Lower Body Bathing: Sit to/from stand;Maximal assistance Lower Body Bathing Details (indicate cue type and reason): MAX A for LB  bathing from STS Upper Body Dressing : Sitting;Minimal assistance   Lower Body Dressing: Sit to/from stand;Maximal assistance;Bed level Lower Body Dressing Details (indicate cue type and reason): doff socks bed  level Toilet Transfer: Ambulation;BSC/3in1;Rolling walker (2 wheels);Moderate assistance Toilet Transfer Details (indicate cue type and reason): clinical judgement; pt able to ambulate 20 ft in room using RW. Pt uses 4WW at baseline, leans on front of RW, does not place hands on RW handles, significant UE support through elbows with forward flexed posture throughout. Given body mechanics and unable to rise to upright, pt requires MOD A for RW mgmt to propel AD forward and manipulate through turns Toileting- Clothing Manipulation and Hygiene: Maximal assistance;Sit to/from stand Toileting - Clothing Manipulation Details (indicate cue type and reason): pt found with purewick failure, linen and bed soaked. MAX A for standing pericare     Functional mobility during ADLs: Minimal assistance;Moderate assistance;Rolling walker (2 wheels);Cueing for sequencing;Cueing for safety       Vision Baseline Vision/History: 1 Wears glasses Ability to See in Adequate Light: 0 Adequate Patient Visual Report: No change from baseline Vision Assessment?: Wears glasses for reading     Perception Perception: Impaired   Perception-Other Comments: poor body awareness and proprioception   Praxis         Pertinent Vitals/Pain Pain Assessment Pain Assessment: No/denies pain     Extremity/Trunk Assessment Upper Extremity Assessment Upper Extremity Assessment: Generalized weakness;Right hand dominant;RUE deficits/detail;LUE deficits/detail RUE Deficits / Details: hx of multiple partial digit amputations RUE Sensation: history of peripheral neuropathy;decreased light touch RUE Coordination: decreased fine motor LUE Deficits / Details: hx of multiple partial digit amputations LUE Sensation: history of peripheral neuropathy;decreased light touch LUE Coordination: decreased fine motor   Lower Extremity Assessment Lower Extremity Assessment: Generalized weakness;RLE deficits/detail;LLE deficits/detail RLE  Deficits / Details: hx of femur fx s/p ORIF RLE Sensation: history of peripheral neuropathy LLE Sensation: decreased light touch;history of peripheral neuropathy   Cervical / Trunk Assessment Cervical / Trunk Assessment: Kyphotic   Communication Communication Communication: No apparent difficulties   Cognition Arousal: Alert Behavior During Therapy: WFL for tasks assessed/performed Cognition: Cognition impaired     Awareness: Intellectual awareness impaired, Online awareness impaired   Attention impairment (select first level of impairment): Sustained attention Executive functioning impairment (select all impairments): Problem solving, Reasoning                   Following commands: Intact       Cueing  General Comments   Cueing Techniques: Verbal cues  VSS on RA. BLE/UE swelling noted. RN in to assess digit wound, providing bandage           Home Living Family/patient expects to be discharged to:: Private residence Living Arrangements: Alone Available Help at Discharge: Family;Personal care attendant;Available PRN/intermittently Type of Home: House Home Access: Stairs to enter;Ramped entrance Entrance Stairs-Number of Steps: 1   Home Layout: One level     Bathroom Shower/Tub: Sponge bathes at baseline   Bathroom Toilet: Handicapped height     Home Equipment: Rollator (4 wheels);Transport chair;Cane - single Librarian, Academic (2 wheels);BSC/3in1;Grab bars - toilet   Additional Comments: PCA comes 2x daily for 2 hours      Prior Functioning/Environment Prior Level of Function : Needs assist;History of Falls (last six months)       Physical Assist : ADLs (physical)   ADLs (physical): Dressing;IADLs Mobility Comments: 2x falls in the past 6 months. 5TT limited household ambulation. ADLs Comments: Sponge bathes at baseline  mod independent, PCA assists with LB dressing, meals, household chores. uses pillbox for med mgmt. orders groceries from [facility], takes care of small dog.    OT Problem List: Decreased strength;Decreased range of motion;Impaired balance (sitting and/or standing);Decreased cognition;Decreased safety awareness;Impaired sensation;Increased edema;Decreased knowledge of precautions;Decreased knowledge of use of DME or AE   OT Treatment/Interventions: Self-care/ADL training;Patient/family education;Therapeutic activities;Energy conservation;Neuromuscular education;Therapeutic exercise;DME and/or AE instruction;Balance training      OT Goals(Current goals can be found in the care plan section)   Acute Rehab OT Goals OT Goal Formulation: With patient Time For Goal Achievement: 06/07/24 Potential to Achieve Goals: Good   OT Frequency:  Min 2X/week       AM-PAC OT 6 Clicks Daily Activity     Outcome Measure Help from another person eating meals?: A Little Help from another person taking care of personal grooming?: A Little Help from another person toileting, which includes using toliet, bedpan, or urinal?: A Lot Help from another person bathing (including washing, rinsing, drying)?: A Lot Help from another person to put on and taking off regular upper body clothing?: A Lot Help from another person to put on and taking off regular lower body clothing?: A Lot 6 Click Score: 14   End of Session Equipment Utilized During Treatment: Rolling walker (2 wheels);Gait belt Nurse Communication: Mobility status  Activity Tolerance: Patient tolerated treatment well;No increased pain Patient left: in bed;with call bell/phone within reach;with bed alarm set  OT Visit Diagnosis: Muscle weakness (generalized) (M62.81);Repeated falls (R29.6);Other abnormalities of gait and mobility (R26.89);Unsteadiness on feet (R26.81);History of falling (Z91.81)                Time: 9097-9064 OT Time Calculation (min): 33 min Charges:  OT General Charges $OT Visit: 1 Visit OT Evaluation $OT Eval Low Complexity: 1 Low OT  Treatments $Self Care/Home Management : 8-22 mins  Avilyn Virtue L. Roselee Tayloe, OTR/L  05/24/2024, 10:41 AM

## 2024-05-24 NOTE — Progress Notes (Signed)
 [facility] Liaison Note  Received request from [facility] for hospice services at home after discharge. Spoke with patient and son Angela Castro to initiate education related to hospice philosophy, services and team approach to care. Both verbalized understanding of information given. Per discussion, the plan is for discharge home tomorrow.   DME needs discussed. Patient has the following equipment in the home: rollator, [facility], walker Family requests the following equipment for delivery: hospital bed  Please send signed and completed DNR home with patient/family. Please provide prescriptions at discharge as needed to ensure ongoing symptom management.  [facility] information and contact numbers given to [place]. Please call with any concerns.  Thank you for the opportunity to participate in this patient's care.   Eleanor Nail, LPN [facility] Liaison [phone]

## 2024-05-24 NOTE — Evaluation (Signed)
 Physical Therapy Evaluation Patient Details Name: Angela Castro MRN: [id number] DOB: 04-08-1941 Today's Date: 05/24/2024  History of Present Illness  Pt is a 84 y.o. female admitted with hyperkalemia, generalized weakness, AKI & worsening renal function. PMH significant for progressive CKD Stage 4, R femur fx s/p ORIF, CHF, DM Type II, gout, neuropathy, HLD, multiple finger amputations  Clinical Impression  PTA, pt was IND with mobility using a rollator, had PCA 2-4hrs/day M-F, and her son assists on the weekends as able. Pt has had a decline in her medical status and has had discussions with TOC, palliative team and family regarding discharge on palliative care vs hospice.  At time of evaluation, pt appears near her functional baseline requiring CGA and intermittent VC for safety with all mobility. She tolerated ambulating 85ft with rollator before returning to room and resting in bed. She reported that she was previously participating in HHPT and just graduated. She would benefit from continued HHPT services to work on functional strength, endurance and balance if she is willing and able to participate. PT will continue to follow while she is admitted to acute hospital.         If plan is discharge home, recommend the following: A little help with walking and/or transfers;A little help with bathing/dressing/bathroom;Assist for transportation;Assistance with cooking/housework   Can travel by private vehicle        Equipment Recommendations Hospital bed (if going home on hospice, recommend hospital bed for comfort and ease of care)  Recommendations for Other Services       Functional Status Assessment Patient has had a recent decline in their functional status and demonstrates the ability to make significant improvements in function in a reasonable and predictable amount of time.     Precautions / Restrictions Precautions Precautions: Fall Precaution/Restrictions Comments: multiple  digit amputations Restrictions Weight Bearing Restrictions Per Provider Order: No      Mobility  Bed Mobility Overal bed mobility: Needs Assistance Bed Mobility: Supine to Sit, Sit to Supine     Supine to sit: Contact guard, HOB elevated, Used rails Sit to supine: HOB elevated, Used rails   General bed mobility comments: incr time and effort to scoot to EOB with HOB elevated, VC to wait for PT to have DME in place before standing    Transfers Overall transfer level: Needs assistance Equipment used: Rollator (4 wheels) Transfers: Sit to/from Stand Sit to Stand: Contact guard assist           General transfer comment: PT assisted with brake management as patient's personal rollator is different and allows her to support herself on backrest bar since she is unable to use handles due to neuropathy and multiple finger amputations.    Ambulation/Gait Ambulation/Gait assistance: Contact guard assist Gait Distance (Feet): 80 Feet Assistive device: Rollator (4 wheels)   Gait velocity: decreased     General Gait Details: pt rests forearms on handles of rollator and grips backrest bar as tolerated to walk, reports this is how she uses her rollator at home secondary to kyphotic posture, neuropathy and multiple finger amputations. Completes distance at CGA, no LOB noted, limited activity tolerance due to generalized weakness and decreased endurance  Stairs            Wheelchair Mobility     Tilt Bed    Modified Rankin (Stroke Patients Only)       Balance Overall balance assessment: Needs assistance, History of Falls Sitting-balance support: Feet supported Sitting balance-Leahy Scale: Good  Standing balance support: During functional activity, Reliant on assistive device for balance, Bilateral upper extremity supported Standing balance-Leahy Scale: Fair Standing balance comment: requires forward flexed posture, places hands on front of rollator with elbow  support on handles                             Pertinent Vitals/Pain Pain Assessment Pain Assessment: No/denies pain    Home Living Family/patient expects to be discharged to:: Private residence Living Arrangements: Alone Available Help at Discharge: Family;Personal care attendant;Available PRN/intermittently Type of Home: House Home Access: Stairs to enter;Ramped entrance Entrance Stairs-Rails: None Entrance Stairs-Number of Steps: 1   Home Layout: One level Home Equipment: Rollator (4 wheels);Transport chair;Cane - single Librarian, Academic (2 wheels);BSC/3in1;Grab bars - toilet Additional Comments: PCA comes 2x daily for 2 hours, son helps on weekends    Prior Function Prior Level of Function : Needs assist;History of Falls (last six months)       Physical Assist : ADLs (physical)   ADLs (physical): Dressing;IADLs Mobility Comments: 2x falls in the past 6 months. 5TT limited household ambulation. ADLs Comments: Sponge bathes at baseline mod independent, PCA assists with LB dressing, meals, household chores. uses pillbox for med mgmt. orders groceries from [facility], takes care of small dog.     Extremity/Trunk Assessment   Upper Extremity Assessment Upper Extremity Assessment: Defer to OT evaluation RUE Deficits / Details: hx of multiple partial digit amputations RUE Sensation: history of peripheral neuropathy;decreased light touch RUE Coordination: decreased fine motor LUE Deficits / Details: hx of multiple partial digit amputations LUE Sensation: history of peripheral neuropathy;decreased light touch LUE Coordination: decreased fine motor    Lower Extremity Assessment Lower Extremity Assessment: Generalized weakness;RLE deficits/detail;LLE deficits/detail RLE Deficits / Details: hx of femur fx s/p ORIF RLE Sensation: history of peripheral neuropathy LLE Sensation: decreased light touch;history of peripheral neuropathy    Cervical / Trunk  Assessment Cervical / Trunk Assessment: Kyphotic  Communication   Communication Communication: No apparent difficulties    Cognition Arousal: Alert Behavior During Therapy: WFL for tasks assessed/performed                             Following commands: Intact       Cueing Cueing Techniques: Verbal cues     General Comments General comments (skin integrity, edema, etc.): increased swelling noted to BUE and BLE however pt reports it has decreased overall    Exercises     Assessment/Plan    PT Assessment Patient needs continued PT services  PT Problem List Decreased strength;Decreased activity tolerance;Decreased balance       PT Treatment Interventions Gait training;Functional mobility training;Therapeutic activities;Therapeutic exercise    PT Goals (Current goals can be found in the Care Plan section)  Acute Rehab PT Goals Patient Stated Goal: get home PT Goal Formulation: With patient Time For Goal Achievement: 06/07/24 Potential to Achieve Goals: Good    Frequency Min 2X/week     Co-evaluation               AM-PAC PT 6 Clicks Mobility  Outcome Measure Help needed turning from your back to your side while in a flat bed without using bedrails?: A Little Help needed moving from lying on your back to sitting on the side of a flat bed without using bedrails?: A Little Help needed moving to and from a bed to a chair (  including a wheelchair)?: A Little Help needed standing up from a chair using your arms (e.g., wheelchair or bedside chair)?: A Little Help needed to walk in hospital room?: A Little Help needed climbing 3-5 steps with a railing? : A Lot 6 Click Score: 17    End of Session Equipment Utilized During Treatment: Gait belt Activity Tolerance: Patient tolerated treatment well Patient left: in bed;with call bell/phone within reach;with bed alarm set Nurse Communication: Mobility status PT Visit Diagnosis: Unsteadiness on feet  (R26.81);History of falling (Z91.81);Muscle weakness (generalized) (M62.81)    Time: 8968-8951 PT Time Calculation (min) (ACUTE ONLY): 17 min   Charges:   PT Evaluation $PT Eval Low Complexity: 1 Low PT Treatments $Gait Training: 8-22 mins           Isaiah DEL. Shamecca Whitebread, PT, DPT   [employer] 05/24/2024, 12:27 PM

## 2024-05-24 NOTE — Progress Notes (Signed)
 " Daily Progress Note   Patient Name: Angela Castro       Date: 05/24/2024 DOB: 25-May-1940  Age: 84 y.o. MRN#: [id number] Attending Physician: Angela Mignon DASEN, MD Primary Care Physician: Angela Righter, MD Admit Date: 05/22/2024 Length of Stay: 2 days  Reason for Follow-up: Establishing goals of care  Subjective:   CC: Patient notes she is anxious to go home.  Following up regarding goals of care.  Reviewed EMR including documentation from hospitalist and nephrologist on 05/23/2024.  Patient had also expressed to nephrologist that she was not interested in starting dialysis.  Nephrologist adjusted medications to assist with electrolyte abnormalities and fluid overload.  At time of EMR review on past 24 hours patient has received as needed tramadol  50 mg x 1. Personally reviewed CMP from today noting patient's potassium improved to 5.  Estimated GFR remains low at 7.  Presented to bedside to see patient.  Patient laying comfortably in bed.  No visitors present at bedside.  Introduced myself as a member of the palliative medicine team to patient.  Patient expressed desire to go home soon and was wondering when this would be able to occur.  Again spent time discussing goals for medical care moving forward.  Patient again confirmed that she does not wish to start dialysis.  Patient wants to enjoy her time at home.  Again discussed idea of hospice support at home which patient is wanting.  Noted placed TOC consult to assist with home hospice referral.  Spent time explaining patient and her family would have to choose a hospice agency for referral then they could then speak to liaison about coordinating hospice support at home.  Spent time answering questions as able regarding this.  Also spent time providing emotional support via active listening.  Patient noted that her small dog, Angela Castro, is waiting at home for her and so she wants to return home soon.  She noted Angela Castro has never been alone by herself and  patient's quality time is spent enjoying time with her dog.  She also discussed the death of her husband, Angela Castro, of over 62 years last year in February.  She noted they had actually been together for years prior to even getting married and she misses him dearly.  Patient reflected that she knows everyone has a time and that Ron is waiting for her on the other side when she does pass.  She spent time reminiscing about the choice time she had withdrawn and how much she misses him.  Empathized with difficult situation.  Also explored symptom management with patient.  Patient notes she normally takes Lyrica  25 mg twice a day at home to help with her awful neuropathy.  Patient notes she has been on this medication for months without any difficulties or adverse effects.  We discussed risk of taking 25 mg twice daily with her impaired renal function.  Patient acknowledges risk and noted she would rather have appropriate pain management and deal with the risk of the medication and then not take medication and be up all night in pain like she was last night.  Acknowledged this and noted would start patient on her home dose of Lyrica  25 mg twice daily.  All questions answered at that time.  During conversation hospitalist presented to bedside as well.  Updated hospitalist regarding discussion to assist with coordination of care.  Noted palliative medicine team to continue to follow along with patient's medical journey.  Objective:   Vital Signs:  BP ROLLEN)  171/67 (BP Location: Right Arm)   Pulse 87   Temp 99 F (37.2 C) (Oral)   Resp 15   Ht (P) 4' 11 (1.499 m)   Wt (P) 66.7 kg   SpO2 96%   BMI (P) 29.69 kg/m   Physical Exam: General: NAD, alert, elderly, pleasant Cardiovascular: RRR Respiratory: no increased work of breathing noted, not in respiratory distress Abdomen: not distended Neuro: A&Ox4, following commands easily Psych: appropriately answers all questions  Assessment & Plan:    Assessment: Patient is an 84 year old female with a past medical history of CKD stage IV, HFpEF, diabetes mellitus type 2 not on medication, hypertension, anemia, gout, and neuropathy with loss of multiple digits on upper extremities bilaterally who was admitted on 05/22/2024 for management of progressive weakness.  Upon admission, patient found to have hyperkalemia, AKI, and generalized weakness.  Nephrology consulted for recommendations.  Palliative medicine team initially consulted to assist with goals of care and following up regarding this today.  Recommendations/Plan: # Complex medical decision making/goals of care:  - Discussed care with patient as detailed above in HPI.  Patient again acknowledging that she does not wish to undergo dialysis.  Patient wants to focus on her quality of life while spending time at home with her son and dog.  Patient wanting to go home with hospice support.  TOC has been consulted to assist with home hospice referral.  Palliative medicine team continuing to follow along with patient's medical journey.  -  Code Status: Limited: Do not attempt resuscitation (DNR) -DNR-LIMITED -Do Not Intubate/DNI  Prognosis: < 6 months  # Symptom management: Patient is receiving these palliative interventions for symptom management with an intent to improve quality of life.   - Neuropathy, chronic Discussed risk and benefits of resuming patient's home medication of Lyrica  25 mg twice daily based on patient's decreased renal function.  Patient acknowledged risk and noted she would rather have symptom management of her neuropathy with the risk of medication then to continue to have the pain she is feeling.   - Change p.o. Lyrica  to 25 mg twice daily  # Psychosocial Support:  - Son: Angela Castro  # Discharge Planning: Home with Hospice - [facility] assisting with coordination of care  Discussed with: Patient, hospitalist, [facility], RN  Thank you for allowing the palliative care team to  participate in the care Angela Castro.  Angela Radar, DO Palliative Care Provider PMT # [phone]  If patient remains symptomatic despite maximum doses, please call PMT at [phone] between 0700 and 1900. Outside of these hours, please call attending, as PMT does not have night coverage.  Billing based on MDM: Moderate  Problems Addressed: One or more chronic illnesses with severe exacerbation, progression, or side effects of treatment.  Amount and/or Complexity of Data: Category 3:Discussion of management or test interpretation with external physician/other qualified health care professional/appropriate source (not separately reported)  Risks: Prescription drug management  "

## 2024-05-25 DIAGNOSIS — E1142 Type 2 diabetes mellitus with diabetic polyneuropathy: Secondary | ICD-10-CM

## 2024-05-25 DIAGNOSIS — R531 Weakness: Secondary | ICD-10-CM

## 2024-05-25 DIAGNOSIS — R4589 Other symptoms and signs involving emotional state: Secondary | ICD-10-CM

## 2024-05-25 DIAGNOSIS — Z7189 Other specified counseling: Secondary | ICD-10-CM

## 2024-05-25 DIAGNOSIS — Z515 Encounter for palliative care: Secondary | ICD-10-CM

## 2024-05-25 LAB — GLUCOSE, CAPILLARY
Glucose-Capillary: 120 mg/dL — ABNORMAL HIGH (ref 70–99)
Glucose-Capillary: 164 mg/dL — ABNORMAL HIGH (ref 70–99)

## 2024-05-25 NOTE — Discharge Summary (Signed)
 "  Physician Discharge Summary  Angela Castro FMW:[id number] DOB: [birth year] DOA: 05/22/2024  PCP: Kip Righter, MD  Admit date: 05/22/2024 Discharge date: 05/25/2024  Admitted From: Home Disposition: Home with home hospice Recommendations for Outpatient Follow-up:  Palliative/hospice to follow patient at home  Home Health: Home hospice Equipment/Devices: Hospice arranging.  Discharge Condition: Stable but guarded prognosis CODE STATUS: DNR   Follow-up Information     Kip Righter, MD. Schedule an appointment as soon as possible for a visit in 1 week(s).   Specialty: Family Medicine Contact information: [address] [place] [address] [phone]         [facility] Follow up.   Specialty: Hospice and Palliative Medicine Why: [facility] will provide hospice services. Contact information: [address] [place]  [address] [phone]                Hospital course 84 year old F with PMH of CKD-4, HFpEF, DM-2 not on meds, HTN, anemia, gout and  neuropathy with loss of multiple digits bilaterally presenting with progressive weakness and admitted with hyperkalemia, AKI and generalized weakness.  Patient has seen nephrology, Dr. Macel in the past but has no longer seeing them because she does not want to undergo dialysis.    In ED, hypertensive with SBP 170s.  K6.6. Cr 5.5 about 3.5 in 2024).  BUN 80.  Bicarb 16.  AG 15.  Hgb 10.  UA with  > 300 proteins.  EKG without significant changes.  Patient received bicarb, insulin  and dextrose , Lokelma  and calcium  gluconate, and admitted.  Palliative medicine consulted.   The next day, Cr plateaued at 5.5.  Hyperkalemia improved.  Nephrology consulted and started IV Lasix .  Creatinine plateaued at 5.5.  Patient is not interested in dialysis.    Also expressed her desire to go home throughout her hospitalization.  She stated I am ready to go and meet my husband again.   Evaluated by palliative.  She is discharged with home hospice.   See individual problem list below for more.   Problems addressed during this hospitalization Hyperkalemia: K 6.6 on admission.  Hyperkalemia likely due to renal failure.  Resolved.   CKD-5: Per renal, eGFR was already 13 last year.  Not interested in dialysis.  Not uremic.  AKI ruled out. - Continue home torsemide .   Uncontrolled hypertension: BP elevated but improved. - Continue home amlodipine , bisoprolol  and torsemide .   Chronic HFpEF: No cardiopulmonary symptoms.  She has 1+ BLE edema.  - Diuretics as above   Gout without acute flare -Continue allopurinol -renally adjusted.   NIDDM-2 with neuropathy and CKD-4: A1c 5.7% (was 7.3% in 10/10).  Has been taken off insulin  and Januvia. \   Anion gap metabolic acidosis: Likely due to azotemia.  Improved.   Anemia of renal disease: Hgb dropped some.  No overt bleeding.   Generalized weakness: Lives alone.  Uses walker for mobility.  Has caregiver that visits twice a day. - PT/OT recommended home health but patient will be discharging with home hospice once DME delivered.   Goal of care discussion: Appropriately DNR.  Patient with progressive renal failure.  She is not interested in dialysis.  She says I am ready to go and meet my husband again - Discharged with home hospice.    Body mass index is 29.69 kg/m (pended).       Pressure skin injury: Present on admission. Wound 05/23/24 1730 Pressure Injury Ischial tuberosity Right Stage 2 -  Partial thickness loss of  dermis presenting as a shallow open injury with a red, pink wound bed without slough. (Active)    Consultations: Nephrology Palliative medicine  Time spent 35  minutes  Vital signs Vitals:   05/24/24 0534 05/24/24 1400 05/24/24 2037 05/25/24 0453  BP: (!) 171/67 (!) 154/69 (!) 148/73 (!) 128/56  Pulse: 87 88 93 94  Temp: 99 F (37.2 C) 98.9 F (37.2 C) 98.9 F (37.2 C) 99.6 F (37.6 C)   Resp: 15 20 18 18   Height:      Weight:      SpO2: 96% 98% 100% 94%  TempSrc: Oral Oral Oral Oral  BMI (Calculated):         Discharge exam  GENERAL: No apparent distress.  Nontoxic. HEENT: MMM.  Vision and hearing grossly intact.  NECK: Supple.  No apparent JVD.  RESP:  No IWOB.  Fair aeration bilaterally. CVS:  RRR. Heart sounds normal.  ABD/GI/GU: BS+. Abd soft, NTND.  MSK/EXT:  Moves extremities.  1-2 BLE edema.  Missing all fingers. SKIN: Chronic wound over right fifth digit.  No sign of infection. NEURO: AA.  Oriented appropriately.  No apparent focal neuro deficit. PSYCH: Calm. Normal affect.   Discharge Instructions Discharge Instructions     Increase activity slowly   Complete by: As directed    Increase activity slowly   Complete by: As directed    No wound care   Complete by: As directed    No wound care   Complete by: As directed       Allergies as of 05/25/2024       Reactions   Sulfa Antibiotics Shortness Of Breath, Rash   Adhesive [tape] Other (See Comments)   Causes redness, please use paper tape   Keflex [cephalexin] Other (See Comments)   Possibly caused lethargy (taken with Flagyl) Tolerated cefepime  02/20/23   Lisinopril Cough   Metronidazole Other (See Comments)   Possibly caused lethargy (taken with keflex)   Teflaro  [ceftaroline ] Other (See Comments)   Lethargy, possibly rash Tolerated cefepime  02/20/23        Medication List     STOP taking these medications    amoxicillin -clavulanate 875-125 MG tablet Commonly known as: AUGMENTIN    cholecalciferol  25 MCG (1000 UNIT) tablet Commonly known as: VITAMIN D3   clobetasol  0.05 % external solution Commonly known as: TEMOVATE    gabapentin  100 MG capsule Commonly known as: NEURONTIN    HYDROcodone -acetaminophen  5-325 MG tablet Commonly known as: NORCO/VICODIN   Lantus  SoloStar 100 UNIT/ML Solostar Pen Generic drug: insulin  glargine   sitaGLIPtin 50 MG tablet Commonly known  as: JANUVIA       TAKE these medications    allopurinol  100 MG tablet Commonly known as: ZYLOPRIM  Take 100 mg by mouth 2 (two) times daily.   amLODipine  5 MG tablet Commonly known as: NORVASC  Take 5 mg by mouth every morning.   ascorbic acid  500 MG tablet Commonly known as: VITAMIN C  Take 500 mg by mouth every morning.   aspirin  EC 81 MG tablet Take 81 mg by mouth daily. Swallow whole.   bisoprolol  10 MG tablet Commonly known as: ZEBETA  Take 10 mg by mouth in the morning.   Calcium -D 600-400 MG-UNIT Tabs Take 1 tablet by mouth every morning.   ferrous sulfate  325 (65 FE) MG tablet Commonly known as: FerrouSul Take 1 tablet (325 mg total) by mouth 2 (two) times daily with a meal.   fluocinonide  cream 0.05 % Commonly known as: LIDEX  Apply 1 application topically 2 (two)  times daily as needed (psoriasis).   loratadine  10 MG tablet Commonly known as: CLARITIN  Take 10 mg by mouth every morning.   MULTIVITAMIN/IRON PO Take 1 tablet by mouth every morning.   pentoxifylline 400 MG CR tablet Commonly known as: TRENTAL Take 400 mg by mouth 3 (three) times daily.   pregabalin  25 MG capsule Commonly known as: LYRICA  Take 25 mg by mouth 2 (two) times daily.   silver  sulfADIAZINE  1 % cream Commonly known as: SILVADENE  Apply 1 Application topically daily.   simvastatin  5 MG tablet Commonly known as: ZOCOR  Take 5 mg by mouth at bedtime.   torsemide  20 MG tablet Commonly known as: DEMADEX  Take 2 tablets (40 mg total) by mouth daily.   traMADol  50 MG tablet Commonly known as: ULTRAM  Take 50 mg by mouth 3 (three) times daily as needed.         Procedures/Studies:   No results found.     The results of significant diagnostics from this hospitalization (including imaging, microbiology, ancillary and laboratory) are listed below for reference.     Microbiology: No results found for this or any previous visit (from the past 240 hours).    Labs:  CBC: Recent Labs  Lab 05/22/24 2016 05/24/24 1110  WBC 10.1 11.8*  HGB 10.0* 8.6*  HCT 30.1* 26.0*  MCV 93.5 93.5  PLT 305 275   BMP &GFR Recent Labs  Lab 05/22/24 2016 05/23/24 0040 05/23/24 1627 05/23/24 1753 05/24/24 1110  NA 143 143 143 142 143  K 6.6* 5.7* 5.7* 6.0* 5.0  CL 112* 110 113* 111 111  CO2 16* 18* 18* 19* 19*  GLUCOSE 169* 105* 155* 154* 174*  BUN 80* 72* 73* 75* 74*  CREATININE 5.50* 5.47* 5.16* 5.35* 5.50*  CALCIUM  9.8 9.8 8.9 9.4 9.2  MG  --   --   --   --  1.9  PHOS  --   --  5.4*  --  5.2*   Estimated Creatinine Clearance: 6.6 mL/min (A) (by C-G formula based on SCr of 5.5 mg/dL (H)). Liver & Pancreas: Recent Labs  Lab 05/22/24 2016 05/23/24 1627 05/24/24 1110  AST 23  --  18  ALT 22  --  14  ALKPHOS 131*  --  106  BILITOT 0.2  --  0.2  PROT 6.8  --  5.8*  ALBUMIN 3.2* 2.6* 2.6*   No results for input(s): LIPASE, AMYLASE in the last 168 hours. No results for input(s): AMMONIA in the last 168 hours. Diabetic: Recent Labs    05/23/24 1753  HGBA1C 5.7*   Recent Labs  Lab 05/24/24 1108 05/24/24 1610 05/24/24 2032 05/25/24 0730 05/25/24 1116  GLUCAP 164* 171* 237* 120* 164*   Cardiac Enzymes: No results for input(s): CKTOTAL, CKMB, CKMBINDEX, TROPONINI in the last 168 hours. No results for input(s): PROBNP in the last 8760 hours. Coagulation Profile: No results for input(s): INR, PROTIME in the last 168 hours. Thyroid Function Tests: No results for input(s): TSH, T4TOTAL, FREET4, T3FREE, THYROIDAB in the last 72 hours. Lipid Profile: No results for input(s): CHOL, HDL, LDLCALC, TRIG, CHOLHDL, LDLDIRECT in the last 72 hours. Anemia Panel: Recent Labs    05/23/24 1627  FERRITIN 322*  TIBC 199*  IRON 79   Urine analysis:    Component Value Date/Time   COLORURINE YELLOW 05/22/2024 0008   APPEARANCEUR HAZY (A) 05/22/2024 0008   LABSPEC 1.017 05/22/2024 0008   PHURINE  5.0 05/22/2024 0008   GLUCOSEU 150 (A) 05/22/2024 0008  HGBUR NEGATIVE 05/22/2024 0008   BILIRUBINUR NEGATIVE 05/22/2024 0008   KETONESUR NEGATIVE 05/22/2024 0008   PROTEINUR >=300 (A) 05/22/2024 0008   NITRITE NEGATIVE 05/22/2024 0008   LEUKOCYTESUR NEGATIVE 05/22/2024 0008   Sepsis Labs: Invalid input(s): PROCALCITONIN, LACTICIDVEN   SIGNED:  Raylin Diguglielmo T Tammera Engert, MD  [facility] 05/25/2024, 1:58 PM   "

## 2024-05-25 NOTE — Progress Notes (Signed)
 " Daily Progress Note   Patient Name: Angela Castro       Date: 05/25/2024 DOB: [birth year]  Age: 84 y.o. MRN#: [id number] Attending Physician: Angela Mignon DASEN, MD Primary Care Physician: Angela Righter, MD Admit Date: 05/22/2024 Length of Stay: 3 days  Reason for Follow-up: Establishing goals of care  Subjective:   CC: Patient notes her lower extremity pain is improving.  Following up regarding goals for care and symptom management.  Reviewed EMR documentation from 05/24/2024 from hospitalist, TOC, and nephrologist.  Nephrologist was able to provide recommendations that patient could continue upon discharge to assist with symptom management.  Hospitalist able to coordinate care and continue appropriate medical interventions at this time.  TOC was able to speak to son regarding hospice choice and they chose for patient to go home with [facility] hospice.  Upon review of TOC note from today, DME has been delivered and plan is for family to transport patient home today.  Presented to bedside to see patient.  RN present at bedside as well.  Patient greatly looking forward to going home today.  She noted her caregiver has already said that her dog, Angela Castro, has been looking for her.  Spent time providing emotional support and hope to patient could have a good time at home with her dog and family. Inquired about the neuropathic pain in her lower extremities bilaterally and she feels that it has improved now that she is back on her home dose of Lyrica .  Noted can be further adjusted at home with hospice.  All questions answered at that time.  Noted palliative medicine team would be available if needed.  Objective:   Vital Signs:  BP (!) 128/56 (BP Location: Left Arm)   Pulse 94   Temp 99.6 F (37.6 C) (Oral)   Resp 18   Ht (P) 4' 11 (1.499 m)   Wt (P) 66.7 kg   SpO2 94%   BMI (P) 29.69 kg/m   Physical Exam: General: NAD, alert, elderly, pleasant Cardiovascular: RRR Respiratory: no increased  work of breathing noted, not in respiratory distress Abdomen: not distended Neuro: A&Ox4, following commands easily Psych: appropriately answers all questions  Assessment & Plan:   Assessment: Patient is an 84 year old female with a past medical history of CKD stage IV, HFpEF, diabetes mellitus type 2 not on medication, hypertension, anemia, gout, and neuropathy with loss of multiple digits on upper extremities bilaterally who was admitted on 05/22/2024 for management of progressive weakness.  Upon admission, patient found to have hyperkalemia, AKI, and generalized weakness.  Nephrology consulted for recommendations.  Palliative medicine team initially consulted to assist with goals of care and following up regarding this today.  Recommendations/Plan: # Complex medical decision making/goals of care:  - Discussed care with patient as detailed above in HPI.  Patient planning to go home with hospice support through [facility] today.  Palliative medicine team available if needed.  -  Code Status: Limited: Do not attempt resuscitation (DNR) -DNR-LIMITED -Do Not Intubate/DNI  Prognosis: < 6 months  # Symptom management: Patient is receiving these palliative interventions for symptom management with an intent to improve quality of life.   - Neuropathy, chronic Had discussed risk and benefits of resuming patient's home medication of Lyrica  25 mg twice daily based on patient's decreased renal function.  Patient already acknowledged risk and noted she would rather have symptom management of her neuropathy with the risk of medication then to continue to have the pain.   - Continue p.o. Lyrica   to 25 mg twice daily  # Psychosocial Support:  - Son: Angela Castro  # Discharge Planning: Home with Hospice via [facility] today - TOC assisting with coordination of care  Discussed with: Patient, RN  Thank you for allowing the palliative care team to participate in the care Angela Castro.  Angela Radar, DO Palliative Care  Provider PMT # [phone]  If patient remains symptomatic despite maximum doses, please call PMT at [phone] between 0700 and 1900. Outside of these hours, please call attending, as PMT does not have night coverage.  Personally spent 25 minutes in patient care including extensive chart review (labs, imaging, progress/consult notes, vital signs), medically appropraite exam, discussed with treatment team, education to patient, family, and staff, documenting clinical information, medication review and management, coordination of care, and available advanced directive documents.   "

## 2024-05-25 NOTE — TOC Transition Note (Signed)
 Transition of Care [facility]) - Discharge Note  Patient Details  Name: Angela Castro MRN: [id number] Date of Birth: 07/15/1940  [facility]) CM/SW Contact:  Duwaine GORMAN Aran, LCSW Phone Number: 05/25/2024, 9:17 AM  Clinical Narrative: CSW confirmed with Melissa with [facility] that DME was delivered to the home yesterday. Family to transport patient home. Care management signing off.  Final next level of care: Home w Hospice Care Barriers to Discharge: Barriers Resolved  Patient Goals and CMS Choice Patient states their goals for this hospitalization and ongoing recovery are:: Home with hospice CMS Medicare.gov Compare Post Acute Care list provided to:: Patient Represenative (must comment) Gimena Buick (son)) Choice offered to / list presented to : Adult Children  Discharge Plan and Services Additional resources added to the After Visit Summary for   In-house Referral: Clinical Social Work, Hospice / Palliative Care Post Acute Care Choice: Hospice          DME Arranged: Hospital bed DME Agency: Other - Comment ([facility] hospice to set up hospital bed.)  Social Drivers of Health (SDOH) Interventions SDOH Screenings   Food Insecurity: No Food Insecurity (05/23/2024)  Housing: Low Risk (05/23/2024)  Transportation Needs: No Transportation Needs (05/23/2024)  Utilities: Not At Risk (05/23/2024)  Depression (PHQ2-9): Low Risk (05/19/2023)  Social Connections: Unknown (05/23/2024)  Tobacco Use: Low Risk (05/23/2024)   Readmission Risk Interventions     No data to display
